# Patient Record
Sex: Male | Born: 1985 | Hispanic: No | Marital: Married | Smoking: Former smoker
Health system: Southern US, Community
[De-identification: ages and names within clinical notes are randomized; demographics above are authoritative.]

## PROBLEM LIST (undated history)

## (undated) DIAGNOSIS — N028 Recurrent and persistent hematuria with other morphologic changes: Secondary | ICD-10-CM

## (undated) DIAGNOSIS — N289 Disorder of kidney and ureter, unspecified: Secondary | ICD-10-CM

## (undated) DIAGNOSIS — N390 Urinary tract infection, site not specified: Secondary | ICD-10-CM

## (undated) HISTORY — PX: CYST REMOVAL NECK: SHX6281

---

## 2013-02-01 ENCOUNTER — Inpatient Hospital Stay (HOSPITAL_COMMUNITY)
Admission: EM | Admit: 2013-02-01 | Discharge: 2013-02-08 | DRG: 700 | Disposition: A | Discharge status: Home / Self Care | Payer: Medicaid Other | Attending: Internal Medicine | Admitting: Internal Medicine

## 2013-02-01 ENCOUNTER — Encounter (HOSPITAL_COMMUNITY): Payer: Self-pay

## 2013-02-01 DIAGNOSIS — N059 Unspecified nephritic syndrome with unspecified morphologic changes: Principal | ICD-10-CM | POA: Diagnosis present

## 2013-02-01 DIAGNOSIS — N390 Urinary tract infection, site not specified: Secondary | ICD-10-CM

## 2013-02-01 DIAGNOSIS — D72829 Elevated white blood cell count, unspecified: Secondary | ICD-10-CM | POA: Diagnosis present

## 2013-02-01 DIAGNOSIS — R109 Unspecified abdominal pain: Secondary | ICD-10-CM

## 2013-02-01 DIAGNOSIS — N3289 Other specified disorders of bladder: Secondary | ICD-10-CM | POA: Diagnosis present

## 2013-02-01 DIAGNOSIS — F17200 Nicotine dependence, unspecified, uncomplicated: Secondary | ICD-10-CM | POA: Diagnosis present

## 2013-02-01 DIAGNOSIS — R319 Hematuria, unspecified: Secondary | ICD-10-CM | POA: Diagnosis present

## 2013-02-01 DIAGNOSIS — N3090 Cystitis, unspecified without hematuria: Secondary | ICD-10-CM | POA: Diagnosis present

## 2013-02-01 DIAGNOSIS — R809 Proteinuria, unspecified: Secondary | ICD-10-CM | POA: Diagnosis present

## 2013-02-01 DIAGNOSIS — N10 Acute tubulo-interstitial nephritis: Secondary | ICD-10-CM

## 2013-02-01 HISTORY — DX: Urinary tract infection, site not specified: N39.0

## 2013-02-01 LAB — CBC WITH DIFFERENTIAL/PLATELET
Basophils Absolute: 0 10*3/uL (ref 0.0–0.1)
Eosinophils Relative: 1 % (ref 0–5)
Lymphocytes Relative: 20 % (ref 12–46)
MCV: 77.6 fL — ABNORMAL LOW (ref 78.0–100.0)
Neutro Abs: 10.2 10*3/uL — ABNORMAL HIGH (ref 1.7–7.7)
Neutrophils Relative %: 73 % (ref 43–77)
Platelets: 334 10*3/uL (ref 150–400)
RBC: 4.47 MIL/uL (ref 4.22–5.81)
RDW: 12.9 % (ref 11.5–15.5)
WBC: 13.9 10*3/uL — ABNORMAL HIGH (ref 4.0–10.5)

## 2013-02-01 LAB — COMPREHENSIVE METABOLIC PANEL
Alkaline Phosphatase: 75 U/L (ref 39–117)
BUN: 14 mg/dL (ref 6–23)
CO2: 28 mEq/L (ref 19–32)
Chloride: 101 mEq/L (ref 96–112)
Creatinine, Ser: 1.14 mg/dL (ref 0.50–1.35)
GFR calc Af Amer: >90 mL/min (ref >90)
GFR calc non Af Amer: 89 mL/min — ABNORMAL LOW (ref >90)
Glucose, Bld: 100 mg/dL — ABNORMAL HIGH (ref 70–99)
Potassium: 3.3 mEq/L — ABNORMAL LOW (ref 3.5–5.1)
Total Bilirubin: 0.2 mg/dL — ABNORMAL LOW (ref 0.3–1.2)

## 2013-02-01 NOTE — ED Notes (Signed)
Patient presents with LLQ and RLQ abdominal pain. Onset 1 day ago. Also endorses anorexia, decreased PO intake, nausea and 3 episodes of vomiting. LBM today after taking dulcolax PO. Prior to today, LBM 3 days ago. Denies fevers, sweats or chills. No hx abdominal surgeries.

## 2013-02-02 ENCOUNTER — Emergency Department (HOSPITAL_COMMUNITY): Payer: Medicaid Other

## 2013-02-02 ENCOUNTER — Inpatient Hospital Stay (HOSPITAL_COMMUNITY): Payer: Medicaid Other

## 2013-02-02 ENCOUNTER — Encounter (HOSPITAL_COMMUNITY): Payer: Self-pay | Admitting: Internal Medicine

## 2013-02-02 DIAGNOSIS — N10 Acute tubulo-interstitial nephritis: Secondary | ICD-10-CM

## 2013-02-02 DIAGNOSIS — R109 Unspecified abdominal pain: Secondary | ICD-10-CM

## 2013-02-02 DIAGNOSIS — N390 Urinary tract infection, site not specified: Secondary | ICD-10-CM

## 2013-02-02 DIAGNOSIS — R319 Hematuria, unspecified: Secondary | ICD-10-CM

## 2013-02-02 LAB — COMPREHENSIVE METABOLIC PANEL
ALT: 13 U/L (ref 0–53)
AST: 14 U/L (ref 0–37)
CO2: 28 mEq/L (ref 19–32)
Calcium: 8.7 mg/dL (ref 8.4–10.5)
Chloride: 104 mEq/L (ref 96–112)
Creatinine, Ser: 1.12 mg/dL (ref 0.50–1.35)
GFR calc Af Amer: >90 mL/min (ref >90)
GFR calc non Af Amer: >90 mL/min (ref >90)
Glucose, Bld: 92 mg/dL (ref 70–99)
Sodium: 143 mEq/L (ref 135–145)
Total Bilirubin: 0.2 mg/dL — ABNORMAL LOW (ref 0.3–1.2)

## 2013-02-02 LAB — URINALYSIS, ROUTINE W REFLEX MICROSCOPIC
Ketones, ur: NEGATIVE mg/dL
Nitrite: NEGATIVE
Protein, ur: 30 mg/dL — AB
pH: 7 (ref 5.0–8.0)

## 2013-02-02 LAB — URINALYSIS, MICROSCOPIC ONLY
Ketones, ur: NEGATIVE mg/dL
Nitrite: NEGATIVE
pH: 5.5 (ref 5.0–8.0)

## 2013-02-02 LAB — CBC WITH DIFFERENTIAL/PLATELET
Basophils Absolute: 0 10*3/uL (ref 0.0–0.1)
Basophils Relative: 0 % (ref 0–1)
Eosinophils Absolute: 0.1 10*3/uL (ref 0.0–0.7)
Hemoglobin: 11.2 g/dL — ABNORMAL LOW (ref 13.0–17.0)
MCH: 27.3 pg (ref 26.0–34.0)
MCHC: 34.4 g/dL (ref 30.0–36.0)
Neutro Abs: 6 10*3/uL (ref 1.7–7.7)
Neutrophils Relative %: 66 % (ref 43–77)
Platelets: 292 10*3/uL (ref 150–400)

## 2013-02-02 LAB — URINE CULTURE

## 2013-02-02 LAB — URINE MICROSCOPIC-ADD ON

## 2013-02-02 MED ORDER — DEXTROSE 5 % IV SOLN: 1.0000 g | Freq: Once | INTRAVENOUS | Status: DC

## 2013-02-02 MED ORDER — ONDANSETRON HCL 4 MG/2ML IJ SOLN
4.0000 mg | Freq: Four times a day (QID) | INTRAMUSCULAR | Status: DC | PRN
  Administered 2013-02-03: 4 mg via INTRAVENOUS
  Filled 2013-02-02: qty 2

## 2013-02-02 MED ORDER — SODIUM CHLORIDE 0.9 % IV SOLN
INTRAVENOUS | Status: DC
  Administered 2013-02-02: 01:00:00 via INTRAVENOUS

## 2013-02-02 MED ORDER — ACETAMINOPHEN 650 MG RE SUPP: 650.0000 mg | Freq: Four times a day (QID) | RECTAL | Status: DC | PRN

## 2013-02-02 MED ORDER — OXYCODONE HCL 5 MG PO TABS
5.0000 mg | ORAL_TABLET | ORAL | Status: DC | PRN
  Administered 2013-02-02 – 2013-02-07 (×6): 5 mg via ORAL
  Filled 2013-02-02 (×6): qty 1

## 2013-02-02 MED ORDER — DEXTROSE 5 % IV SOLN
1.0000 g | Freq: Once | INTRAVENOUS | Status: AC
  Administered 2013-02-02: 1 g via INTRAVENOUS
  Filled 2013-02-02: qty 10

## 2013-02-02 MED ORDER — ONDANSETRON HCL 4 MG/2ML IJ SOLN
4.0000 mg | Freq: Three times a day (TID) | INTRAMUSCULAR | Status: DC | PRN
  Administered 2013-02-02: 4 mg via INTRAVENOUS
  Filled 2013-02-02: qty 2

## 2013-02-02 MED ORDER — DEXTROSE 5 % IV SOLN: 1.0000 g | INTRAVENOUS | Status: DC

## 2013-02-02 MED ORDER — HYDROMORPHONE HCL PF 1 MG/ML IJ SOLN
1.0000 mg | Freq: Once | INTRAMUSCULAR | Status: AC
  Administered 2013-02-02: 1 mg via INTRAVENOUS
  Filled 2013-02-02: qty 1

## 2013-02-02 MED ORDER — ACETAMINOPHEN 325 MG PO TABS: 650.0000 mg | ORAL_TABLET | Freq: Four times a day (QID) | ORAL | Status: DC | PRN

## 2013-02-02 MED ORDER — IOHEXOL 300 MG/ML  SOLN
100.0000 mL | Freq: Once | INTRAMUSCULAR | Status: AC | PRN
  Administered 2013-02-02: 100 mL via INTRAVENOUS

## 2013-02-02 MED ORDER — DEXTROSE-NACL 5-0.45 % IV SOLN
INTRAVENOUS | Status: DC
  Administered 2013-02-02: 08:00:00 via INTRAVENOUS

## 2013-02-02 MED ORDER — ONDANSETRON HCL 4 MG PO TABS: 4.0000 mg | ORAL_TABLET | Freq: Four times a day (QID) | ORAL | Status: DC | PRN

## 2013-02-02 MED ORDER — ONDANSETRON HCL 4 MG/2ML IJ SOLN
4.0000 mg | Freq: Once | INTRAMUSCULAR | Status: AC
  Administered 2013-02-02: 4 mg via INTRAVENOUS
  Filled 2013-02-02: qty 2

## 2013-02-02 MED ORDER — HYDROMORPHONE HCL PF 1 MG/ML IJ SOLN
1.0000 mg | INTRAMUSCULAR | Status: DC | PRN
  Administered 2013-02-02 (×2): 2 mg via INTRAVENOUS
  Administered 2013-02-02: 1 mg via INTRAVENOUS
  Administered 2013-02-03 – 2013-02-05 (×12): 2 mg via INTRAVENOUS
  Administered 2013-02-05: 1 mg via INTRAVENOUS
  Administered 2013-02-05: 2 mg via INTRAVENOUS
  Administered 2013-02-05 (×2): 1 mg via INTRAVENOUS
  Administered 2013-02-05 – 2013-02-06 (×3): 2 mg via INTRAVENOUS
  Administered 2013-02-06: 1 mg via INTRAVENOUS
  Administered 2013-02-06: 2 mg via INTRAVENOUS
  Filled 2013-02-02: qty 1
  Filled 2013-02-02 (×5): qty 2
  Filled 2013-02-02 (×4): qty 1
  Filled 2013-02-02: qty 2
  Filled 2013-02-02: qty 1
  Filled 2013-02-02: qty 2
  Filled 2013-02-02: qty 1
  Filled 2013-02-02 (×5): qty 2
  Filled 2013-02-02: qty 1
  Filled 2013-02-02 (×2): qty 2
  Filled 2013-02-02: qty 1
  Filled 2013-02-02 (×4): qty 2

## 2013-02-02 MED ORDER — IOHEXOL 300 MG/ML  SOLN
50.0000 mL | Freq: Once | INTRAMUSCULAR | Status: AC | PRN
  Administered 2013-02-02: 50 mL via ORAL

## 2013-02-02 MED ORDER — SODIUM CHLORIDE 0.9 % IV BOLUS (SEPSIS)
1000.0000 mL | Freq: Once | INTRAVENOUS | Status: AC
  Administered 2013-02-02: 1000 mL via INTRAVENOUS

## 2013-02-02 MED ORDER — HYDROMORPHONE HCL PF 1 MG/ML IJ SOLN: 1.0000 mg | INTRAMUSCULAR | Status: DC | PRN

## 2013-02-02 MED ORDER — HYDROMORPHONE HCL PF 1 MG/ML IJ SOLN
1.0000 mg | INTRAMUSCULAR | Status: DC | PRN
  Administered 2013-02-02: 1 mg via INTRAVENOUS
  Filled 2013-02-02: qty 1

## 2013-02-02 MED ORDER — DEXTROSE 5 % IV SOLN
1.0000 g | Freq: Two times a day (BID) | INTRAVENOUS | Status: DC
  Administered 2013-02-02 – 2013-02-05 (×7): 1 g via INTRAVENOUS
  Filled 2013-02-02 (×8): qty 10

## 2013-02-02 MED ORDER — POTASSIUM CHLORIDE CRYS ER 20 MEQ PO TBCR
40.0000 meq | EXTENDED_RELEASE_TABLET | Freq: Once | ORAL | Status: AC
  Administered 2013-02-02: 40 meq via ORAL
  Filled 2013-02-02: qty 2

## 2013-02-02 MED ORDER — SODIUM CHLORIDE 0.9 % IV SOLN
INTRAVENOUS | Status: AC
  Administered 2013-02-02 – 2013-02-03 (×3): via INTRAVENOUS

## 2013-02-02 NOTE — ED Provider Notes (Signed)
History     CSN: [id number]  Arrival date & time 02/01/13  2046   First MD Initiated Contact with Patient 02/01/13 2354      Chief Complaint  Patient presents with  . Abdominal Pain    (Consider location/radiation/quality/duration/timing/severity/associated sxs/prior treatment) Patient is a 27 y.o. male presenting with abdominal pain. The history is provided by the patient.  Abdominal Pain Associated symptoms: hematuria, nausea and vomiting   Associated symptoms: no chest pain, no dysuria, no fever and no shortness of breath    2 day history of bilateral flank pain the flank pain is severe stenotic 10 associated with very dark urine associated with nausea and vomiting x3 no diarrhea abdominal pain is described as sharp it does radiate to the back and to the suprapubic area. No prior history of similar symptoms. Negative past medical history.  History reviewed. No pertinent past medical history.  History reviewed. No pertinent past surgical history.  No family history on file.  History  Substance Use Topics  . Smoking status: Current Every Day Smoker -- 1.00 packs/day  . Smokeless tobacco: Never Used  . Alcohol Use: No      Review of Systems  Constitutional: Negative for fever and diaphoresis.  HENT: Negative for congestion.   Eyes: Negative for redness.  Respiratory: Negative for shortness of breath.   Cardiovascular: Negative for chest pain.  Gastrointestinal: Positive for nausea, vomiting and abdominal pain.  Genitourinary: Positive for hematuria and flank pain. Negative for dysuria.  Musculoskeletal: Positive for back pain.  Skin: Negative for rash.  Neurological: Negative for headaches.  Hematological: Does not bruise/bleed easily.  Psychiatric/Behavioral: Negative for confusion.    Allergies  Review of patient's allergies indicates no known allergies.  Home Medications  No current outpatient prescriptions on file.  BP 118/78  Pulse 67  Temp(Src) 98 F  (36.7 C) (Oral)  Resp 18  SpO2 96%  Physical Exam  Constitutional: He is oriented to person, place, and time. He appears well-developed and well-nourished. He appears distressed.  HENT:  Head: Normocephalic and atraumatic.  Next membranes are dry.  Eyes: Conjunctivae and EOM are normal. Pupils are equal, round, and reactive to light.  Neck: Normal range of motion.  Cardiovascular: Normal rate, regular rhythm and normal heart sounds.   No murmur heard. Pulmonary/Chest: Effort normal and breath sounds normal.  Abdominal: Soft. Bowel sounds are normal. There is no tenderness.  Musculoskeletal: Normal range of motion. He exhibits no edema.  Neurological: He is alert and oriented to person, place, and time. No cranial nerve deficit. He exhibits normal muscle tone. Coordination normal.  Skin: Skin is warm. No rash noted.    ED Course  Procedures (including critical care time)  Labs Reviewed  COMPREHENSIVE METABOLIC PANEL - Abnormal; Notable for the following:    Potassium 3.3 (*)    Glucose, Bld 100 (*)    Albumin 3.2 (*)    Total Bilirubin 0.2 (*)    GFR calc non Af Amer 89 (*)    All other components within normal limits  CBC WITH DIFFERENTIAL - Abnormal; Notable for the following:    WBC 13.9 (*)    Hemoglobin 12.4 (*)    HCT 34.7 (*)    MCV 77.6 (*)    Neutro Abs 10.2 (*)    All other components within normal limits  URINALYSIS, MICROSCOPIC ONLY - Abnormal; Notable for the following:    APPearance CLOUDY (*)    Hgb urine dipstick LARGE (*)  Protein, ur >300 (*)    Leukocytes, UA SMALL (*)    Bacteria, UA MANY (*)    Squamous Epithelial / LPF FEW (*)    Casts HYALINE CASTS (*)    All other components within normal limits  URINE CULTURE  LIPASE, BLOOD   Ct Abdomen Pelvis W Contrast  02/02/2013  *RADIOLOGY REPORT*  Clinical Data: Left lower quadrant and right lower quadrant pain for 1 day.  Anorexia, nausea, and vomiting.  CT ABDOMEN AND PELVIS WITH CONTRAST   Technique:  Multidetector CT imaging of the abdomen and pelvis was performed following the standard protocol during bolus administration of intravenous contrast.  Contrast: OMNIPAQUE IOHEXOL 300 MG/ML  SOLN  Comparison: None.  Findings: Dependent atelectasis in the lung bases.  The liver, spleen, gallbladder, pancreas, adrenal glands, kidneys, abdominal aorta, inferior vena cava, and retroperitoneal lymph nodes are unremarkable.  The stomach and small bowel are not abnormally distended.  Stool filled colon without distension or wall thickening.  No free air or free fluid in the abdomen. Abdominal wall musculature appears intact.  Pelvis:  Mild prominence of prostate gland, measuring about three by 4.2 cm.  Bladder wall is diffusely thickened suggesting the bladder hypertrophy versus cystitis.  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  Diverticula in the sigmoid colon without diverticulitis.  No free or loculated pelvic fluid collections. The appendix is normal.  No significant pelvic lymphadenopathy. Normal alignment of the lumbar vertebrae.  IMPRESSION: No acute process demonstrated in the abdomen or pelvis. Nonspecific bladder wall thickening which could be due to hypertrophy or cystitis.   Original Report Authenticated By: Burman Nieves, M.D.      1. Hematuria   2. Flank pain       MDM   Suspect patient's hematuria is related to a bladder infection or perhaps kidney infection however renal function is normal. No evidence of renal stones on the CT scan although was done with contrast. Patient's had a lot of difficulties with pain control and will require admission. Discuss with the hospitalist they will admit. He is unassigned does not have a primary care Dr. locally. Patient's had pain complaint now for 2 days. Does have a leukocytosis but no fever. Patient has had 3 episodes of vomiting. Patient urine was sent for culture and he  was given Rocephin 1 g IV piggyback. Temporary admit orders done.        Shelda Jakes, MD 02/02/13 [phone]

## 2013-02-02 NOTE — ED Notes (Signed)
Patient laying on stretcher. Patient complaining of pain in left and right flank areas. Denies any nausea at this time. Patient guarding and moaning in pain, states pain is 8/10. Family member at bedside with patient. No acute distress noted, resp are even and unlabored. Will continue to monitor.

## 2013-02-02 NOTE — ED Notes (Signed)
Patient resting on stretcher at this time, sleeping comfortably. Informed patients family member of plan of care. Patient in no acute distress, resp are even and unlabored. Call light on bed. Will continue to monitor.

## 2013-02-02 NOTE — ED Notes (Signed)
Patient using urinal at bedside at this time.

## 2013-02-02 NOTE — H&P (Addendum)
[facility] History and Physical  Mikle Sternberg ZOX:[id number] DOB: 01-20-1986 DOA: 02/01/2013  Referring physician: Dr.Zackowski. PCP: No PCP Per Patient  Specialists: None.  Chief Complaint: Bilateral flank pain.  HPI: Gary Cox is a 27 y.o. male with no significant past medical history who is only from [place] and has been living in the [place] for last one year, has been experiencing bilateral flank pain over the last 2 days. The pain has been gradually worsening with nausea denies any vomiting fever chills dysuria or diarrhea. Patient has been having hematuria along with the flank pain. In the ER UA reveals hematuria and blood work shows mild leukocytosis. CT abdomen and pelvis with contrast only shows possibility of cystitis. Patient at this time has been started on ceftriaxone empirically and since patient has received multiple doses of pain medication has been admitted for further management. Patient states he had a similar episode last year. Denies any chest pain or shortness of breath. Denies any penile discharge or scrotal pain.   Review of Systems: As presented in the history of presenting illness, rest negative.  History reviewed. No pertinent past medical history. Past Surgical History  Procedure Laterality Date  . Cyst removal neck     Social History:  reports that he has been smoking.  He has never used smokeless tobacco. He reports that he does not drink alcohol or use illicit drugs. Lives at home  where does patient live--  can do ADLs.  Can patient participate in ADLs?  No Known Allergies  Family History  Problem Relation Age of Onset  . Hypertension Mother   . Hypertension Father       Prior to Admission medications   Not on File   Physical Exam: Filed Vitals:   02/02/13 0330 02/02/13 0430 02/02/13 0515 02/02/13 0545  BP: 125/76 118/78 117/73 114/60  Pulse: 62 67 65 69  Temp:      TempSrc:      Resp:      SpO2: 100% 96% 97% 98%     General:    well-developed well-nourished.   Eyes: Anicteric no pallor.   ENT: No discharge from the ears eyes nose or mouth.   Neck: No mass felt.   Cardiovascular:  S1-S2 heard.   Respiratory:  no rhonchi or crepitations.   Abdomen:  soft nontender bowel sounds present. I did not see any scrotal swelling or penile discharge.   Skin: No rash.   Musculoskeletal:  no edema.   Psychiatric:  appears normal.   Neurologic:  alert awake oriented to time place and person. Moves all extremities.   Labs on Admission:  Basic Metabolic Panel:  Recent Labs Lab 02/01/13 2112  NA 140  K 3.3*  CL 101  CO2 28  GLUCOSE 100*  BUN 14  CREATININE 1.14  CALCIUM 9.7   Liver Function Tests:  Recent Labs Lab 02/01/13 2112  AST 15  ALT 17  ALKPHOS 75  BILITOT 0.2*  PROT 8.1  ALBUMIN 3.2*    Recent Labs Lab 02/01/13 2112  LIPASE 39   No results found for this basename: AMMONIA,  in the last 168 hours CBC:  Recent Labs Lab 02/01/13 2112  WBC 13.9*  NEUTROABS 10.2*  HGB 12.4*  HCT 34.7*  MCV 77.6*  PLT 334   Cardiac Enzymes: No results found for this basename: CKTOTAL, CKMB, CKMBINDEX, TROPONINI,  in the last 168 hours  BNP (last 3 results) No results found for this basename: PROBNP,  in the last  8760 hours CBG: No results found for this basename: GLUCAP,  in the last 168 hours  Radiological Exams on Admission: Ct Abdomen Pelvis W Contrast  02/02/2013  *RADIOLOGY REPORT*  Clinical Data: Left lower quadrant and right lower quadrant pain for 1 day.  Anorexia, nausea, and vomiting.  CT ABDOMEN AND PELVIS WITH CONTRAST  Technique:  Multidetector CT imaging of the abdomen and pelvis was performed following the standard protocol during bolus administration of intravenous contrast.  Contrast: OMNIPAQUE IOHEXOL 300 MG/ML  SOLN  Comparison: None.  Findings: Dependent atelectasis in the lung bases.  The liver, spleen, gallbladder, pancreas, adrenal glands, kidneys, abdominal aorta,  inferior vena cava, and retroperitoneal lymph nodes are unremarkable.  The stomach and small bowel are not abnormally distended.  Stool filled colon without distension or wall thickening.  No free air or free fluid in the abdomen. Abdominal wall musculature appears intact.  Pelvis:  Mild prominence of prostate gland, measuring about three by 4.2 cm.  Bladder wall is diffusely thickened suggesting the bladder hypertrophy versus cystitis.  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  Diverticula in the sigmoid colon without diverticulitis.  No free or loculated pelvic fluid collections. The appendix is normal.  No significant pelvic lymphadenopathy. Normal alignment of the lumbar vertebrae.  IMPRESSION: No acute process demonstrated in the abdomen or pelvis. Nonspecific bladder wall thickening which could be due to hypertrophy or cystitis.   Original Report Authenticated By: Burman Nieves, M.D.      Assessment/Plan Principal Problem:   Flank pain Active Problems:   Hematuria   1.  Bilateral flank pain with hematuria - continue with ceftriaxone. Follow urine cultures. Check GC and Chlamydia probe. Continued IV hydration and pain medication. Recheck labs particularly hemoglobin. Since patient's urine also shows proteinuria but only hyaline casts may discuss with nephrology in a.m.    Code Status: full code.   Family Communication:  patient's family at bedside.   Disposition Plan:  admit to inpatient.   Cornisha Zetino N. [facility] Pager [phone].  If 7PM-7AM, please contact night-coverage [email or web address] Password [facility] 02/02/2013, 6:42 AM

## 2013-02-02 NOTE — ED Notes (Signed)
Patient resting on stretcher at this time. Patient denies any pain. No acute distress noted, resp are even and unlabored. Call light at bedside. Family member at bedside. Will continue to monitor.

## 2013-02-02 NOTE — ED Notes (Signed)
Patient resting on stretcher at this time. Patient has no complaints. Patient requesting something to eat and drink, verified with MD. Patient given drink and crackers. No acute distress noted, resp are even and unlabored. Family member at bedside. Call light on bed. Will continue to monitor.

## 2013-02-02 NOTE — ED Notes (Signed)
Patient laying on stretcher at this time, no acute distress noted. resp are even and unlabored. Will continue to monitor.

## 2013-02-02 NOTE — ED Notes (Signed)
Pt back in stretcher, wife at the bedside. Given warm blanket.

## 2013-02-02 NOTE — ED Notes (Signed)
Patient now experiencing pain at this time in flank area on left and right. States pain is 10/10. Will notify MD

## 2013-02-02 NOTE — Progress Notes (Signed)
Patient ID: Gary Cox  male  ZOX:[id number]    DOB: 19-Jan-1986    DOA: 02/01/2013  PCP: No PCP Per Patient   Patient admitted by Dr. Toniann Fail today AM, H&P reviewed, and agree with assessment and plan  Assessment/Plan: Principal Problem:   Flank pain likely secondary to bilateral pyelonephritis/UTI - Ordered blood cultures, urine cultures, continue IV Rocephin - He needs to have urology follow-up after dc or in-pt if not improving  Active Problems:   Hematuria/proteinuria with bladder hypertrophy versus cystitis - ordered renal ultrasound, SPEP, UPEP - No fevers currently, no other joint pains or conjunctivitis to suggest Reiter's syndrome, check ESR, CRP.   DVT Prophylaxis: SCD's   Code Status:  Disposition:    Subjective: Complaining of bilateral flank pain  Objective: Weight change:   Intake/Output Summary (Last 24 hours) at 02/02/13 1449 Last data filed at 02/02/13 1027  Gross per 24 hour  Intake      0 ml  Output    300 ml  Net   -300 ml   Blood pressure 115/65, pulse 77, temperature 97.9 F (36.6 C), temperature source Oral, resp. rate 15, SpO2 99.00%.  Physical Exam: General: Alert and awake, oriented x3, uncomfortable with pain HEENT: anicteric sclera, PERLA, EOMI CVS: S1-S2 clear, no murmur rubs or gallops Chest: CTAB Abdomen: soft nontender, nondistended, normal bowel sounds, b/l CVAT Extremities: no c/c/e bilaterally, no joint effusions   Lab Results: Basic Metabolic Panel:  Recent Labs Lab 02/01/13 2112 02/02/13 0904  NA 140 143  K 3.3* 3.6  CL 101 104  CO2 28 28  GLUCOSE 100* 92  BUN 14 12  CREATININE 1.14 1.12  CALCIUM 9.7 8.7   Liver Function Tests:  Recent Labs Lab 02/01/13 2112 02/02/13 0904  AST 15 14  ALT 17 13  ALKPHOS 75 70  BILITOT 0.2* 0.2*  PROT 8.1 7.2  ALBUMIN 3.2* 2.6*    Recent Labs Lab 02/01/13 2112  LIPASE 39   No results found for this basename: AMMONIA,  in the last 168 hours CBC:  Recent  Labs Lab 02/01/13 2112 02/02/13 0904  WBC 13.9* 9.1  NEUTROABS 10.2* 6.0  HGB 12.4* 11.2*  HCT 34.7* 32.6*  MCV 77.6* 79.3  PLT 334 292   Cardiac Enzymes: No results found for this basename: CKTOTAL, CKMB, CKMBINDEX, TROPONINI,  in the last 168 hours BNP: No components found with this basename: POCBNP,  CBG: No results found for this basename: GLUCAP,  in the last 168 hours   Micro Results: No results found for this or any previous visit (from the past 240 hour(s)).  Studies/Results: Ct Abdomen Pelvis W Contrast  02/02/2013  *RADIOLOGY REPORT*  Clinical Data: Left lower quadrant and right lower quadrant pain for 1 day.  Anorexia, nausea, and vomiting.  CT ABDOMEN AND PELVIS WITH CONTRAST  Technique:  Multidetector CT imaging of the abdomen and pelvis was performed following the standard protocol during bolus administration of intravenous contrast.  Contrast: OMNIPAQUE IOHEXOL 300 MG/ML  SOLN  Comparison: None.  Findings: Dependent atelectasis in the lung bases.  The liver, spleen, gallbladder, pancreas, adrenal glands, kidneys, abdominal aorta, inferior vena cava, and retroperitoneal lymph nodes are unremarkable.  The stomach and small bowel are not abnormally distended.  Stool filled colon without distension or wall thickening.  No free air or free fluid in the abdomen. Abdominal wall musculature appears intact.  Pelvis:  Mild prominence of prostate gland, measuring about three by 4.2 cm.  Bladder wall is diffusely  thickened suggesting the bladder hypertrophy versus cystitis.  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  Diverticula in the sigmoid colon without diverticulitis.  No free or loculated pelvic fluid collections. The appendix is normal.  No significant pelvic lymphadenopathy. Normal alignment of the lumbar vertebrae.  IMPRESSION: No acute process demonstrated in the abdomen or pelvis. Nonspecific bladder wall  thickening which could be due to hypertrophy or cystitis.   Original Report Authenticated By: Burman Nieves, M.D.     Medications: Scheduled Meds: . cefTRIAXone (ROCEPHIN)  IV  1 g Intravenous Q12H      LOS: 1 day   Jimya Ciani M.D. [facility] 02/02/2013, 2:49 PM Pager: [phone]  If 7PM-7AM, please contact night-coverage [email or web address] Password [email or web address]

## 2013-02-03 ENCOUNTER — Encounter (HOSPITAL_COMMUNITY): Payer: Self-pay | Admitting: General Practice

## 2013-02-03 LAB — UIFE/LIGHT CHAINS/TP QN, 24-HR UR
Alpha 2, Urine: DETECTED — AB
Free Kappa Lt Chains,Ur: 3.93 mg/dL — ABNORMAL HIGH (ref 0.14–2.42)
Free Kappa/Lambda Ratio: 3.97 ratio (ref 2.04–10.37)
Free Lambda Lt Chains,Ur: 0.99 mg/dL — ABNORMAL HIGH (ref 0.02–0.67)
Total Protein, Urine: 22.3 mg/dL

## 2013-02-03 LAB — URINE CULTURE
Colony Count: NO GROWTH
Culture: NO GROWTH

## 2013-02-03 LAB — BASIC METABOLIC PANEL
BUN: 8 mg/dL (ref 6–23)
Chloride: 107 mEq/L (ref 96–112)
Creatinine, Ser: 1.03 mg/dL (ref 0.50–1.35)
Glucose, Bld: 93 mg/dL (ref 70–99)
Potassium: 3.7 mEq/L (ref 3.5–5.1)

## 2013-02-03 LAB — CBC
HCT: 31.9 % — ABNORMAL LOW (ref 39.0–52.0)
Hemoglobin: 10.8 g/dL — ABNORMAL LOW (ref 13.0–17.0)
MCH: 26.9 pg (ref 26.0–34.0)
MCHC: 33.9 g/dL (ref 30.0–36.0)

## 2013-02-03 MED ORDER — WHITE PETROLATUM GEL
Status: AC
  Administered 2013-02-03: 0.2
  Filled 2013-02-03: qty 5

## 2013-02-03 MED ORDER — SODIUM CHLORIDE 0.9 % IV SOLN
INTRAVENOUS | Status: DC
  Administered 2013-02-03 – 2013-02-05 (×5): via INTRAVENOUS
  Administered 2013-02-06: 1000 mL via INTRAVENOUS
  Administered 2013-02-06 – 2013-02-07 (×2): via INTRAVENOUS

## 2013-02-03 NOTE — Progress Notes (Signed)
[facility] PROGRESS NOTE  Gary Cox ZOX:[id number] DOB: December 27, 1985 DOA: 02/01/2013 PCP: No PCP Per Patient  Assessment/Plan: 1. Pyelonephritis. Urine culture no growth. Gc/Chlamydia pending.  Continue with Ceftriaxone, day 2 antibiotics. CT with bladder hypertrophy vs cystitis. WBC trending down.  2. Hematuria, proteinuria: ? Secondary to infection. Will review CT scan with radiologist.  3. DVT Prophylaxis: SCD's.    Code Status: Full Code.  Family Communication: Wife at bedside, care discussed with her.  Disposition Plan: home when stable.    Consultants:  none  Procedures:  Renal US: negative renal US.   Antibiotics:  Ceftriaxone 4/1  HPI/Subjective: Still with flank pain, not better.   Objective: Filed Vitals:   02/02/13 2125 02/03/13 0153 02/03/13 0507 02/03/13 1438  BP: 132/77 116/68 125/56 132/73  Pulse: 69 72 66 77  Temp: 97.9 F (36.6 C) 97.6 F (36.4 C) 97.9 F (36.6 C) 98 F (36.7 C)  TempSrc: Oral Oral Oral Oral  Resp: 20 16 18 18   Height:      Weight:      SpO2: 99% 99% 100% 96%    Intake/Output Summary (Last 24 hours) at 02/03/13 1510 Last data filed at 02/03/13 1341  Gross per 24 hour  Intake    540 ml  Output   2725 ml  Net  -2185 ml   Filed Weights   02/02/13 1012  Weight: 69.809 kg (153 lb 14.4 oz)    Exam:   General:  No distress.  Cardiovascular: S 1, S 2 RRR  Respiratory: CTA  Abdomen: BS present, soft, NT  Musculoskeletal: flank pain on palpation.   Data Reviewed: Basic Metabolic Panel:  Recent Labs Lab 02/01/13 2112 02/02/13 0904 02/03/13 0535  NA 140 143 143  K 3.3* 3.6 3.7  CL 101 104 107  CO2 28 28 27   GLUCOSE 100* 92 93  BUN 14 12 8   CREATININE 1.14 1.12 1.03  CALCIUM 9.7 8.7 8.9   Liver Function Tests:  Recent Labs Lab 02/01/13 2112 02/02/13 0904  AST 15 14  ALT 17 13  ALKPHOS 75 70  BILITOT 0.2* 0.2*  PROT 8.1 7.2  ALBUMIN 3.2* 2.6*    Recent Labs Lab 02/01/13 2112  LIPASE  39   CBC:  Recent Labs Lab 02/01/13 2112 02/02/13 0904 02/03/13 0535  WBC 13.9* 9.1 7.6  NEUTROABS 10.2* 6.0  --   HGB 12.4* 11.2* 10.8*  HCT 34.7* 32.6* 31.9*  MCV 77.6* 79.3 79.6  PLT 334 292 277   Cardiac Enzymes: No results found for this basename: CKTOTAL, CKMB, CKMBINDEX, TROPONINI,  in the last 168 hours BNP (last 3 results) No results found for this basename: PROBNP,  in the last 8760 hours CBG: No results found for this basename: GLUCAP,  in the last 168 hours  Recent Results (from the past 240 hour(s))  URINE CULTURE     Status: None   Collection Time    02/02/13 12:33 AM      Result Value Range Status   Specimen Description URINE, CLEAN CATCH   Final   Special Requests ADDED 0058   Final   Culture  Setup Time 02/02/2013 01:03   Final   Colony Count NO GROWTH   Final   Culture NO GROWTH   Final   Report Status 02/03/2013 FINAL   Final  URINE CULTURE     Status: None   Collection Time    02/02/13 10:51 AM      Result Value Range Status  Specimen Description URINE, CLEAN CATCH   Final   Special Requests Normal   Final   Culture  Setup Time 02/02/2013 17:01   Final   Colony Count NO GROWTH   Final   Culture NO GROWTH   Final   Report Status 02/02/2013 FINAL   Final  CULTURE, BLOOD (ROUTINE X 2)     Status: None   Collection Time    02/02/13 11:10 AM      Result Value Range Status   Specimen Description BLOOD LEFT ARM   Final   Special Requests     Final   Value: BOTTLES DRAWN AEROBIC AND ANAEROBIC 10CC BLUE  5CC RED   Culture  Setup Time 02/02/2013 16:15   Final   Culture     Final   Value:        BLOOD CULTURE RECEIVED NO GROWTH TO DATE CULTURE WILL BE HELD FOR 5 DAYS BEFORE ISSUING A FINAL NEGATIVE REPORT   Report Status PENDING   Incomplete  CULTURE, BLOOD (ROUTINE X 2)     Status: None   Collection Time    02/02/13 11:20 AM      Result Value Range Status   Specimen Description BLOOD LEFT HAND   Final   Special Requests BOTTLES DRAWN AEROBIC AND  ANAEROBIC 10CC   Final   Culture  Setup Time 02/02/2013 16:14   Final   Culture     Final   Value:        BLOOD CULTURE RECEIVED NO GROWTH TO DATE CULTURE WILL BE HELD FOR 5 DAYS BEFORE ISSUING A FINAL NEGATIVE REPORT   Report Status PENDING   Incomplete     Studies: Ct Abdomen Pelvis W Contrast  02/02/2013  *RADIOLOGY REPORT*  Clinical Data: Left lower quadrant and right lower quadrant pain for 1 day.  Anorexia, nausea, and vomiting.  CT ABDOMEN AND PELVIS WITH CONTRAST  Technique:  Multidetector CT imaging of the abdomen and pelvis was performed following the standard protocol during bolus administration of intravenous contrast.  Contrast: OMNIPAQUE IOHEXOL 300 MG/ML  SOLN  Comparison: None.  Findings: Dependent atelectasis in the lung bases.  The liver, spleen, gallbladder, pancreas, adrenal glands, kidneys, abdominal aorta, inferior vena cava, and retroperitoneal lymph nodes are unremarkable.  The stomach and small bowel are not abnormally distended.  Stool filled colon without distension or wall thickening.  No free air or free fluid in the abdomen. Abdominal wall musculature appears intact.  Pelvis:  Mild prominence of prostate gland, measuring about three by 4.2 cm.  Bladder wall is diffusely thickened suggesting the bladder hypertrophy versus cystitis.  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  Diverticula in the sigmoid colon without diverticulitis.  No free or loculated pelvic fluid collections. The appendix is normal.  No significant pelvic lymphadenopathy. Normal alignment of the lumbar vertebrae.  IMPRESSION: No acute process demonstrated in the abdomen or pelvis. Nonspecific bladder wall thickening which could be due to hypertrophy or cystitis.   Original Report Authenticated By: Burman Nieves, M.D.    US Renal  02/02/2013  *RADIOLOGY REPORT*  Clinical Data: Hematuria with proteinuria, pyelonephritis  RENAL/URINARY TRACT  ULTRASOUND COMPLETE  Comparison:  CT abdomen pelvis dated 02/02/2013  Findings:  Right Kidney:  Measures 12.8 cm.  No mass or hydronephrosis.  Left Kidney:  Measures 13.0 cm.  No mass or hydronephrosis.  Bladder:  Within normal limits.  IMPRESSION: Negative renal ultrasound.   Original  Report Authenticated By: Charline Bills, M.D.     Scheduled Meds: . cefTRIAXone (ROCEPHIN)  IV  1 g Intravenous Q12H   Continuous Infusions: . sodium chloride      Principal Problem:   Flank pain Active Problems:   Hematuria   Acute pyelonephritis- bilateral   UTI (urinary tract infection)    Time spent: 25 minutes     Keanen Dohse  [facility] Pager [phone]. If 7PM-7AM, please contact night-coverage at [email or web address], password [facility] 02/03/2013, 3:10 PM  LOS: 2 days

## 2013-02-04 LAB — BASIC METABOLIC PANEL
Chloride: 101 mEq/L (ref 96–112)
Creatinine, Ser: 1.04 mg/dL (ref 0.50–1.35)
GFR calc Af Amer: >90 mL/min (ref >90)
Potassium: 3.4 mEq/L — ABNORMAL LOW (ref 3.5–5.1)

## 2013-02-04 LAB — GC/CHLAMYDIA PROBE AMP: CT Probe RNA: NEGATIVE

## 2013-02-04 LAB — CBC
HCT: 33.4 % — ABNORMAL LOW (ref 39.0–52.0)
RDW: 12.8 % (ref 11.5–15.5)
WBC: 6.1 10*3/uL (ref 4.0–10.5)

## 2013-02-04 LAB — MICROALBUMIN / CREATININE URINE RATIO
Creatinine, Urine: 41.9 mg/dL
Microalb Creat Ratio: 304.3 mg/g — ABNORMAL HIGH (ref 0.0–30.0)

## 2013-02-04 NOTE — Progress Notes (Signed)
[facility] PROGRESS NOTE  Gary Cox ZOX:[id number] DOB: [birth year] DOA: 02/01/2013 PCP: No PCP Per Patient  Assessment/Plan: 1. Pyelonephritis ? Marland Kitchen Urine culture no growth. Gc/Chlamydia negative.  Continue with Ceftriaxone, day 3 antibiotics. CT with bladder hypertrophy vs cystitis. WBC trending down. Will consult urology.  2. Hematuria, proteinuria: I review  CT scan with radiologist no kidney stone, maybe some renal changes. Will consult urologist. I discussed case with Dr Caryn Section, who  recommend urine protein and cr  in 24 hour, ANA, Hepatitis C, urologist evaluation.  3. DVT Prophylaxis: SCD's.    Code Status: Full Code.  Family Communication: Wife at bedside, care discussed with her.  Disposition Plan: home when stable.    Consultants:  urologist  Procedures:  Renal US: negative renal US.   Antibiotics:  Ceftriaxone 4/1  HPI/Subjective: Still with flank pain, pain today 8/10. Better than yesterday.   Objective: Filed Vitals:   02/03/13 2140 02/04/13 0547 02/04/13 0945 02/04/13 1305  BP: 135/75 126/74 135/71 143/69  Pulse: 77 67 60 69  Temp: 98.2 F (36.8 C) 98.4 F (36.9 C) 98.1 F (36.7 C) 97.6 F (36.4 C)  TempSrc: Oral Oral Oral Oral  Resp: 18 18 16 12   Height:      Weight:      SpO2: 94% 99% 100% 99%    Intake/Output Summary (Last 24 hours) at 02/04/13 1757 Last data filed at 02/04/13 1530  Gross per 24 hour  Intake 2493.33 ml  Output   2050 ml  Net 443.33 ml   Filed Weights   02/02/13 1012  Weight: 69.809 kg (153 lb 14.4 oz)    Exam:   General:  No distress.  Cardiovascular: S 1, S 2 RRR  Respiratory: CTA  Abdomen: BS present, soft, NT  Musculoskeletal: flank pain on palpation.   Data Reviewed: Basic Metabolic Panel:  Recent Labs Lab 02/01/13 2112 02/02/13 0904 02/03/13 0535 02/04/13 0906  NA 140 143 143 140  K 3.3* 3.6 3.7 3.4*  CL 101 104 107 101  CO2 28 28 27 31   GLUCOSE 100* 92 93 126*  BUN 14 12 8 6    CREATININE 1.14 1.12 1.03 1.04  CALCIUM 9.7 8.7 8.9 9.2   Liver Function Tests:  Recent Labs Lab 02/01/13 2112 02/02/13 0904  AST 15 14  ALT 17 13  ALKPHOS 75 70  BILITOT 0.2* 0.2*  PROT 8.1 7.2  ALBUMIN 3.2* 2.6*    Recent Labs Lab 02/01/13 2112  LIPASE 39   CBC:  Recent Labs Lab 02/01/13 2112 02/02/13 0904 02/03/13 0535 02/04/13 0906  WBC 13.9* 9.1 7.6 6.1  NEUTROABS 10.2* 6.0  --   --   HGB 12.4* 11.2* 10.8* 11.6*  HCT 34.7* 32.6* 31.9* 33.4*  MCV 77.6* 79.3 79.6 79.7  PLT 334 292 277 320   Cardiac Enzymes: No results found for this basename: CKTOTAL, CKMB, CKMBINDEX, TROPONINI,  in the last 168 hours BNP (last 3 results) No results found for this basename: PROBNP,  in the last 8760 hours CBG: No results found for this basename: GLUCAP,  in the last 168 hours     Studies: No results found.  Scheduled Meds: . cefTRIAXone (ROCEPHIN)  IV  1 g Intravenous Q12H   Continuous Infusions: . sodium chloride 100 mL/hr at 02/04/13 1222    Principal Problem:   Flank pain Active Problems:   Hematuria   Acute pyelonephritis- bilateral   UTI (urinary tract infection)    Time spent: 25 minutes  Chai Routh  [facility] Pager [phone]. If 7PM-7AM, please contact night-coverage at [email or web address], password [facility] 02/04/2013, 5:57 PM  LOS: 3 days

## 2013-02-04 NOTE — Consult Note (Signed)
Urology Consult   Physician requesting consult: Regalado  Reason for consult: Microscopic hematuria ? pyelonephritis  History of Present Illness: Gary Cox is a 27 y.o. male with no significant PMH who was admitted 02/01/13 for eval and treatment of back pain and N/V.  The pt and his wife state that he first had back pain about a week ago that was treated with motrin and resolved.  He then developed severe back pain 2 days prior to admission that was not relieved with OTC meds.  Due to the pain he developed nausea and vomiting as well.  The pain did not radiate.  He denies any hx of fever, chills, dysuria, difficulty voiding, hematuria, and abdominal pain.  In the ED a clean catch UA was positive for bacteria and microscopic hematuria.  His WBC was slightly elevated at 13.  CT scan was WNL except the possibility of bladder wall thickening.  G/C was negative.  Pt was admitted and started on Rocephin.  Prior to initiation of ABx a urine culture was sent and this is negative.    Pt had an episode of back pain similar to this approx 1 year ago while living in [place]. Per his wife he was told he had an "intestinal infection" and was treated with 1 week of ABx after which his sx resolved.  He denies a history of voiding or storage urinary symptoms, hematuria, UTIs, STDs, urolithiasis, GU malignancy/trauma/surgery.  His mother, father, and brother have all had kidney stone issues but he has never passed one to his knowledge.  He smokes less than 1/2 pack of cigarettes per day and has for approx 8 years.   He is currently complaining of severe back pain.  He points to the level of the thoracolumbar spine bilaterally.  He states he only has N/V when his pain is severe.  Nothing makes his back pain worse and the only relief is with pain medication.  His pain has not improved since admission.  He denies F/C, HA, CP, SOB, abdominal pain, and diarrhea/constipation. He denies any recent trauma to back or neck.     Past Medical History  Diagnosis Date  . UTI (urinary tract infection)     Past Surgical History  Procedure Laterality Date  . Cyst removal neck      Current Hospital Medications:  Home Meds:  Prior to Admission medications   Not on File    Scheduled Meds: . cefTRIAXone (ROCEPHIN)  IV  1 g Intravenous Q12H   Continuous Infusions: . sodium chloride 100 mL/hr at 02/04/13 1222   PRN Meds:.acetaminophen, acetaminophen, HYDROmorphone (DILAUDID) injection, ondansetron (ZOFRAN) IV, ondansetron, oxyCODONE  Allergies: No Known Allergies  Family History  Problem Relation Age of Onset  . Hypertension Mother   . Hypertension Father   nephrolithiasis--mother, father, brother  Social History:  reports that he has been smoking Cigarettes.  He has a 5 pack-year smoking history. He has never used smokeless tobacco. He reports that he does not drink alcohol or use illicit drugs.  ROS: A complete review of systems was performed.  All systems are negative except for pertinent findings as noted.  Physical Exam:  Vital signs in last 24 hours: Temp:  [97.6 F (36.4 C)-98.4 F (36.9 C)] 97.6 F (36.4 C) (04/03 1305) Pulse Rate:  [60-77] 69 (04/03 1305) Resp:  [12-18] 12 (04/03 1305) BP: (126-143)/(69-75) 143/69 mmHg (04/03 1305) SpO2:  [94 %-100 %] 99 % (04/03 1305) General:  Alert and oriented, No acute distress HEENT: Normocephalic,  atraumatic Neck: No JVD or lymphadenopathy Cardiovascular: Regular rate and rhythm Lungs: Clear bilaterally Abdomen: Soft, nontender, nondistended, no abdominal masses Back: severe pain with gentle palpation of bilateral paraspinal muscles at level of thoracolumbar spine.  Easily reproducible. No pain with palpation over spine or discs. No other areas of discomfort.  Extremities: No edema Neurologic: Grossly intact  Laboratory Data:   Recent Labs  02/01/13 2112 02/02/13 0904 02/03/13 0535 02/04/13 0906  WBC 13.9* 9.1 7.6 6.1  HGB 12.4* 11.2*  10.8* 11.6*  HCT 34.7* 32.6* 31.9* 33.4*  PLT 334 292 277 320     Recent Labs  02/01/13 2112 02/02/13 0904 02/03/13 0535 02/04/13 0906  NA 140 143 143 140  K 3.3* 3.6 3.7 3.4*  CL 101 104 107 101  GLUCOSE 100* 92 93 126*  BUN 14 12 8 6   CALCIUM 9.7 8.7 8.9 9.2  CREATININE 1.14 1.12 1.03 1.04     Results for orders placed during the hospital encounter of 02/01/13 (from the past 24 hour(s))  HIV ANTIBODY (ROUTINE TESTING)     Status: None   Collection Time    02/03/13  5:28 PM      Result Value Range   HIV NON REACTIVE  NON REACTIVE  GC/CHLAMYDIA PROBE AMP     Status: None   Collection Time    02/03/13  6:21 PM      Result Value Range   CT Probe RNA NEGATIVE  NEGATIVE   GC Probe RNA NEGATIVE  NEGATIVE  BASIC METABOLIC PANEL     Status: Abnormal   Collection Time    02/04/13  9:06 AM      Result Value Range   Sodium 140  135 - 145 mEq/L   Potassium 3.4 (*) 3.5 - 5.1 mEq/L   Chloride 101  96 - 112 mEq/L   CO2 31  19 - 32 mEq/L   Glucose, Bld 126 (*) 70 - 99 mg/dL   BUN 6  6 - 23 mg/dL   Creatinine, Ser 1.61  0.50 - 1.35 mg/dL   Calcium 9.2  8.4 - 09.6 mg/dL   GFR calc non Af Amer >90  >90 mL/min   GFR calc Af Amer >90  >90 mL/min  CBC     Status: Abnormal   Collection Time    02/04/13  9:06 AM      Result Value Range   WBC 6.1  4.0 - 10.5 K/uL   RBC 4.19 (*) 4.22 - 5.81 MIL/uL   Hemoglobin 11.6 (*) 13.0 - 17.0 g/dL   HCT 04.5 (*) 40.9 - 81.1 %   MCV 79.7  78.0 - 100.0 fL   MCH 27.7  26.0 - 34.0 pg   MCHC 34.7  30.0 - 36.0 g/dL   RDW 91.4  78.2 - 95.6 %   Platelets 320  150 - 400 K/uL    Results for Rabago, Broly   Ref. Range 02/02/2013 00:33 02/02/2013 10:51  Color, Urine Latest Range: YELLOW  YELLOW YELLOW  APPearance Latest Range: CLEAR  CLOUDY (A) CLOUDY (A)  Specific Gravity, Urine Latest Range: 1.005-1.030  1.017 1.010  pH Latest Range: 5.0-8.0  5.5 7.0  Glucose Latest Range: NEGATIVE mg/dL NEGATIVE NEGATIVE  Bilirubin Urine Latest Range: NEGATIVE   NEGATIVE NEGATIVE  Ketones, ur Latest Range: NEGATIVE mg/dL NEGATIVE NEGATIVE  Protein Latest Range: NEGATIVE mg/dL >213 (A) 30 (A)  Urobilinogen, UA Latest Range: 0.0-1.0 mg/dL 0.2 0.2  Nitrite Latest Range: NEGATIVE  NEGATIVE NEGATIVE  Leukocytes, UA  Latest Range: NEGATIVE  SMALL (A) TRACE (A)  Hgb urine dipstick Latest Range: NEGATIVE  LARGE (A) LARGE (A)  Urine-Other No range found MUCOUS PRESENT   WBC, UA Latest Range: <3 WBC/hpf 3-6 3-6  RBC / HPF Latest Range: <3 RBC/hpf 21-50 TOO NUMEROUS TO COUNT  Squamous Epithelial / LPF Latest Range: RARE  FEW (A)   Bacteria, UA Latest Range: RARE  MANY (A) FEW (A)  Casts Latest Range: NEGATIVE  HYALINE CASTS (A)      Renal Function:  Recent Labs  02/01/13 2112 02/02/13 0904 02/03/13 0535 02/04/13 0906  CREATININE 1.14 1.12 1.03 1.04   Estimated Creatinine Clearance: 102.4 ml/min (by C-G formula based on Cr of 1.04).  Radiologic Imaging: US Renal  02/02/2013  *RADIOLOGY REPORT*  Clinical Data: Hematuria with proteinuria, pyelonephritis  RENAL/URINARY TRACT ULTRASOUND COMPLETE  Comparison:  CT abdomen pelvis dated 02/02/2013  Findings:  Right Kidney:  Measures 12.8 cm.  No mass or hydronephrosis.  Left Kidney:  Measures 13.0 cm.  No mass or hydronephrosis.  Bladder:  Within normal limits.  IMPRESSION: Negative renal ultrasound.   Original Report Authenticated By: Charline Bills, M.D.     RADIOLOGY REPORT Clinical Data: Left lower quadrant and right lower quadrant pain  for 1 day. Anorexia, nausea, and vomiting.  CT ABDOMEN AND PELVIS WITH CONTRAST  Technique: Multidetector CT imaging of the abdomen and pelvis was  performed following the standard protocol during bolus  administration of intravenous contrast.  Contrast: OMNIPAQUE IOHEXOL 300 MG/ML SOLN  Comparison: None.  Findings: Dependent atelectasis in the lung bases.  The liver, spleen, gallbladder, pancreas, adrenal glands, kidneys,  abdominal aorta, inferior vena  cava, and retroperitoneal lymph  nodes are unremarkable. The stomach and small bowel are not  abnormally distended. Stool filled colon without distension or  wall thickening. No free air or free fluid in the abdomen.  Abdominal wall musculature appears intact.  Pelvis: Mild prominence of prostate gland, measuring about three  by 4.2 cm. Bladder wall is diffusely thickened suggesting the  bladder hypertrophy versus cystitis. Linear fibrosis extending  from the dome of the bladder to the umbilicus consistent with  urachal remnant. Diverticula in the sigmoid colon without  diverticulitis. No free or loculated pelvic fluid collections.  The appendix is normal. No significant pelvic lymphadenopathy.  Normal alignment of the lumbar vertebrae.  IMPRESSION:  No acute process demonstrated in the abdomen or pelvis.  Nonspecific bladder wall thickening which could be due to  hypertrophy or cystitis.  Original Report Authenticated By: Burman Nieves, M.D.  Impression/Assessment/Plan:  Microscopic hematuria--his CT scan is normal but he will require an outpatient cystoscopy.  He is a smoker and bladder eval is necessary.  It is possible the pt may have passed a small kidney stone but there is no way to confirm this.  It is also possible the pt has prostatitis but this is less likely.  Dr. Brunilda Payor spoke with the radiologist and there is no actual bladder wall thickening on the CT.  This appearance was due to the bladder not being distended.    The pt does not have pyelonephritis.  His urine culture was negative, his CT and renal U/S were negative, he has been afebrile with a normal WBC,  and he has had no GU sx.  His admission UA that showed bacteria was a clean catch specimen that was likely contaminated.  If his back pain were due to pyelonephritis the pain could not be reproduced on PE and  it would be improving with Abx therapy.  Suggest d/c Abx.    His back pain is musculoskeletal as evidenced on  physical exam.  Would suggest an MRI of the spine to completely eval this.  He denies any recent trauma and this may be muscle spasms.     Silas Flood 02/04/2013, 3:32 PM   I have examined the patient and reviewed Lynden Oxford note.  I agree with the findings and the treatment plan.

## 2013-02-05 ENCOUNTER — Inpatient Hospital Stay (HOSPITAL_COMMUNITY): Payer: Medicaid Other

## 2013-02-05 LAB — PROTEIN, URINE, 24 HOUR
Collection Interval-UPROT: 24 hours
Protein, Urine: 24 mg/dL

## 2013-02-05 LAB — PROTEIN ELECTROPHORESIS, SERUM
Beta 2: 7 % — ABNORMAL HIGH (ref 3.2–6.5)
Beta Globulin: 6.7 % (ref 4.7–7.2)
Gamma Globulin: 21.2 % — ABNORMAL HIGH (ref 11.1–18.8)
M-Spike, %: NOT DETECTED g/dL

## 2013-02-05 LAB — BASIC METABOLIC PANEL
BUN: 9 mg/dL (ref 6–23)
CO2: 32 mEq/L (ref 19–32)
Chloride: 102 mEq/L (ref 96–112)
Creatinine, Ser: 1.16 mg/dL (ref 0.50–1.35)
Glucose, Bld: 100 mg/dL — ABNORMAL HIGH (ref 70–99)

## 2013-02-05 LAB — HEPATITIS PANEL, ACUTE: Hepatitis B Surface Ag: NEGATIVE

## 2013-02-05 LAB — CBC
HCT: 33.5 % — ABNORMAL LOW (ref 39.0–52.0)
MCV: 79.6 fL (ref 78.0–100.0)
RBC: 4.21 MIL/uL — ABNORMAL LOW (ref 4.22–5.81)
RDW: 12.6 % (ref 11.5–15.5)
WBC: 7.2 10*3/uL (ref 4.0–10.5)

## 2013-02-05 MED ORDER — LISINOPRIL 5 MG PO TABS
5.0000 mg | ORAL_TABLET | Freq: Every day | ORAL | Status: DC
  Administered 2013-02-05 – 2013-02-06 (×2): 5 mg via ORAL
  Filled 2013-02-05 (×3): qty 1

## 2013-02-05 MED ORDER — OMEGA-3-ACID ETHYL ESTERS 1 G PO CAPS
2.0000 g | ORAL_CAPSULE | Freq: Two times a day (BID) | ORAL | Status: DC
  Administered 2013-02-05 – 2013-02-08 (×7): 2 g via ORAL
  Filled 2013-02-05 (×9): qty 2

## 2013-02-05 MED ORDER — ZOLPIDEM TARTRATE 5 MG PO TABS
5.0000 mg | ORAL_TABLET | Freq: Once | ORAL | Status: AC
  Administered 2013-02-05: 5 mg via ORAL
  Filled 2013-02-05: qty 1

## 2013-02-05 NOTE — Progress Notes (Addendum)
Gary Cox        RENAL CONSULT   Reason for Consult: Flank pain, hematuria, and 1.16 G. proteinuria Referring Physician: Dr. Hartley Cox  HPI: Gary Cox is a 27 y.o. [place] man admitted with flank pain and hematuria on 02/01/2013. Admitting diagnosis was "pyelonephritis." Urinalysis on admission showed TNTC RBCs, 3-6 WBCs. Urine culture shows no growth. 24 hour urine protein showed 1.1 6G proteinuria/24 HR and 1.99g Cr/24 hr. flank pain persists. Renal consult was requested by Dr. Sunnie Cox. .    Past Medical History  Diagnosis Date  . UTI (urinary tract infection)     Family History  Problem Relation Age of Onset  . Hypertension Mother   . Hypertension Father    he has a 60-month-old child  Social History: Born and grew up in in [place], finished 10th grade. works at [employer] as Conservation officer, nature, Lives with wife ([place]), married to yr,  5 pack years cigarettes, drinks no alcohol  Allergies: No Known Allergies  Medications:  I have reviewed the patient's current medications.   ROS- no angina, no claudication, no melena, no hematochezia, no gross hematuria, no renal colic, no decreased force of urinary stream, no nocturia, no doe, no orthopnea, no purulent sputum, no hemoptysis, no weight gain or weight loss, no cold or heat intolerance.  Physical Exam Blood pressure 131/75, pulse 89, temperature 98.7 F (37.1 C), temperature source Oral, resp. rate 17, height 5\' 7"  (1.702 m), weight 69.809 kg (153 lb 14.4 oz), SpO2 97.00%. General-awake, alert, friendly, has pain in both flanks Nose, mouth, pharynx-moist mucous membranes, no oral ulcers Chest-clear Heart-no rub Abd-no organs or masses felt, bowel sounds present, no bruits are heard, bilateral CVA tenderness GU-circumcised penis, testes descended bilaterally, no masses felt Extr-no arthritis, no rash, no edema, no atheroembolic changes Neuro- right-handed, strength equal, sensation  intact  Lab- UA-SG 1.010, pH 7, glucose, ketones, nitrite are negative, protein 30, too numerous to count RBCs, 3-6 WBCs, occasional hyaline casts CBC-hemoglobin 11.5, WBC 7200, PLT 295K CMET-sodium 140, potassium 3.9, sclerotic 102, CO2 32, BUN 9, CR 1.16, calcium 8.9, alkaline phosphatase 70, albumin 2.6 ANA-neg Hep C-neg HIV-neg 24 hour urine protein-1.1 6G 24-hour urine creatinine-1.99 G, creatinine clearance with CR 1.16 equals 120 ml/min  CT abdomen and pelvis-kidneys appeared normal, no renal calculi, "no acute process demonstrated in abdomen or pelvis."   Assessment/Plan: 1. Flank pain/hematuria 2. Proteinuria 3. 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 The only diagnosis is by renal biopsy. I would not recommend that at this time. Several therapies have been tried for Ig A. Nephropathy. Converting enzyme inhibitors occasionally will relieve flank pain. Treatment with fish oil also can reduce proteinuria and preserve renal function in some cases. I would like to try both of these at this time.  Recommend:  Fish oil (enough to give 2 g EPA per day). This comes out to 2 Lovaza BID.   Lisinopril 5 mg/D. (as long as BP does not get too low). If proteinuria persists or if renal function worsens, then renal biopsy may be indicated. I don't think he should be started on methylprednisolone or prednisone at this time. Let's see if starting these medicines helps his flank pain area I don't think he needs any more antibiotics  Gary Cox F 02/05/2013, 3:40 PM

## 2013-02-05 NOTE — Progress Notes (Signed)
  Subjective: Patient reports: No pain at this time.  Took some pain medicine earlier this morning.  Objective: Vital signs in last 24 hours: Temp:  [97.6 F (36.4 C)-99.1 F (37.3 C)] 98.4 F (36.9 C) (04/04 0620) Pulse Rate:  [60-72] 72 (04/04 0620) Resp:  [12-19] 18 (04/04 0620) BP: (135-143)/(69-97) 137/81 mmHg (04/04 0620) SpO2:  [94 %-100 %] 94 % (04/04 0620)  Intake/Output from previous day: 04/03 0701 - 04/04 0700 In: 3210 [P.O.:880; I.V.:2330] Out: 1750 [Urine:1750] Intake/Output this shift:    Physical Exam:  No CVA tenderness.  Kidneys are not palpable. Lungs - Normal respiratory effort, chest expands symmetrically.  Abdomen - Soft, non-tender & non-distended  Lab Results:  Recent Labs  02/03/13 0535 02/04/13 0906 02/05/13 0550  HGB 10.8* 11.6* 11.5*  HCT 31.9* 33.4* 33.5*   BMET  Recent Labs  02/04/13 0906 02/05/13 0550  NA 140 140  K 3.4* 3.9  CL 101 102  CO2 31 32  GLUCOSE 126* 100*  BUN 6 9  CREATININE 1.04 1.16  CALCIUM 9.2 8.9   No results found for this basename: LABPT, INR,  in the last 72 hours No results found for this basename: LABURIN,  in the last 72 hours   Studies/Results: No results found.  Assessment/Plan:  Back pain.  Do not think it is related to the urinary tracts. Urine culture is negative.  CT scan revealed normal kidneys.  I reviewed the CT scan with Dr Myles Rosenthal.  The appearance of thickened bladder wall is felt to be secondary to under distended bladder.  Suggest MRI lumbo sacral spine to rule out any spinal cord issue.  Will need cystoscopy for microhematuria although I doubt he has a bladder tumor.  Can be done as outpatient if patient is discharged soon.      LOS: 4 days   Gary Cox 02/05/2013, 9:15 AM

## 2013-02-05 NOTE — Progress Notes (Signed)
Patient returned from MRI via bed.  Patient moaning and crying out in pain.  Requesting IV pain medicine.  Dilaudid IV 2mg  ordered q 3 hrs prn.  Patient requesting a dose now.  Pharmacist consulted about giving pain medicine early and stated it was OK.  Dilaudid 2mg  IV given at 1730.  Will continue to monitor.

## 2013-02-05 NOTE — Progress Notes (Signed)
[facility] PROGRESS NOTE  [name] ZOX:[id number] DOB: 05-02-1986 DOA: 02/01/2013 PCP: No PCP Per Patient  Assessment/Plan: 1. Flank pain; urology doesn't think patient has pyelonephritis.  Urine culture no growth. Gc/Chlamydia negative.  CT with bladder hypertrophy vs cystitis. WBC trending down. Urology recommend MRI lumbar spine. Cystoscopy out patient. Will observed off antibiotics.  2. Hematuria, proteinuria: ANA negative, hepatitis B and C negative. 24 hour Protein in urine pending. If more than 1 gr of protein will consult renal.  3. DVT Prophylaxis: SCD's.    Code Status: Full Code.  Family Communication: Wife at bedside, care discussed with her.  Disposition Plan: home when stable.    Consultants:  urologist  Procedures:  Renal US: negative renal US.   Antibiotics:  Ceftriaxone 4/1---4-4  HPI/Subjective: Still with flank pain, pain today 8/10.   Objective: Filed Vitals:   02/04/13 0945 02/04/13 1305 02/04/13 2132 02/05/13 0620  BP: 135/71 143/69 143/97 137/81  Pulse: 60 69 66 72  Temp: 98.1 F (36.7 C) 97.6 F (36.4 C) 99.1 F (37.3 C) 98.4 F (36.9 C)  TempSrc: Oral Oral Oral   Resp: 16 12 19 18   Height:      Weight:      SpO2: 100% 99% 99% 94%    Intake/Output Summary (Last 24 hours) at 02/05/13 1307 Last data filed at 02/05/13 0636  Gross per 24 hour  Intake   2770 ml  Output   1500 ml  Net   1270 ml   Filed Weights   02/02/13 1012  Weight: 69.809 kg (153 lb 14.4 oz)    Exam:   General:  No distress.  Cardiovascular: S 1, S 2 RRR  Respiratory: CTA  Abdomen: BS present, soft, NT  Musculoskeletal: flank pain on palpation.   Data Reviewed: Basic Metabolic Panel:  Recent Labs Lab 02/01/13 2112 02/02/13 0904 02/03/13 0535 02/04/13 0906 02/05/13 0550  NA 140 143 143 140 140  K 3.3* 3.6 3.7 3.4* 3.9  CL 101 104 107 101 102  CO2 28 28 27 31  32  GLUCOSE 100* 92 93 126* 100*  BUN 14 12 8 6 9   CREATININE 1.14 1.12  1.03 1.04 1.16  CALCIUM 9.7 8.7 8.9 9.2 8.9   Liver Function Tests:  Recent Labs Lab 02/01/13 2112 02/02/13 0904  AST 15 14  ALT 17 13  ALKPHOS 75 70  BILITOT 0.2* 0.2*  PROT 8.1 7.2  ALBUMIN 3.2* 2.6*    Recent Labs Lab 02/01/13 2112  LIPASE 39   CBC:  Recent Labs Lab 02/01/13 2112 02/02/13 0904 02/03/13 0535 02/04/13 0906 02/05/13 0550  WBC 13.9* 9.1 7.6 6.1 7.2  NEUTROABS 10.2* 6.0  --   --   --   HGB 12.4* 11.2* 10.8* 11.6* 11.5*  HCT 34.7* 32.6* 31.9* 33.4* 33.5*  MCV 77.6* 79.3 79.6 79.7 79.6  PLT 334 292 277 320 295   Cardiac Enzymes: No results found for this basename: CKTOTAL, CKMB, CKMBINDEX, TROPONINI,  in the last 168 hours BNP (last 3 results) No results found for this basename: PROBNP,  in the last 8760 hours CBG: No results found for this basename: GLUCAP,  in the last 168 hours     Studies: No results found.  Scheduled Meds: . cefTRIAXone (ROCEPHIN)  IV  1 g Intravenous Q12H   Continuous Infusions: . sodium chloride 100 mL/hr at 02/05/13 0801    Principal Problem:   Flank pain Active Problems:   Hematuria   Acute pyelonephritis- bilateral  UTI (urinary tract infection)    Time spent: 25 minutes     Korina Tretter  [facility] Pager [phone]. If 7PM-7AM, please contact night-coverage at [email or web address], password [facility] 02/05/2013, 1:07 PM  LOS: 4 days

## 2013-02-06 LAB — SEDIMENTATION RATE: Sed Rate: 119 mm/hr — ABNORMAL HIGH (ref 0–16)

## 2013-02-06 LAB — BASIC METABOLIC PANEL
CO2: 30 mEq/L (ref 19–32)
GFR calc non Af Amer: >90 mL/min (ref >90)
Glucose, Bld: 91 mg/dL (ref 70–99)
Potassium: 3.6 mEq/L (ref 3.5–5.1)
Sodium: 141 mEq/L (ref 135–145)

## 2013-02-06 LAB — CBC
Hemoglobin: 11.8 g/dL — ABNORMAL LOW (ref 13.0–17.0)
RBC: 4.28 MIL/uL (ref 4.22–5.81)

## 2013-02-06 MED ORDER — HYDROMORPHONE HCL PF 1 MG/ML IJ SOLN
1.0000 mg | INTRAMUSCULAR | Status: DC | PRN
  Administered 2013-02-06 – 2013-02-07 (×5): 1 mg via INTRAVENOUS
  Filled 2013-02-06 (×4): qty 1

## 2013-02-06 MED ORDER — LISINOPRIL 10 MG PO TABS
10.0000 mg | ORAL_TABLET | Freq: Every day | ORAL | Status: DC
  Administered 2013-02-07 – 2013-02-08 (×2): 10 mg via ORAL
  Filled 2013-02-06 (×2): qty 1

## 2013-02-06 MED ORDER — DOCUSATE SODIUM 100 MG PO CAPS
100.0000 mg | ORAL_CAPSULE | Freq: Two times a day (BID) | ORAL | Status: DC
  Administered 2013-02-06 – 2013-02-08 (×5): 100 mg via ORAL
  Filled 2013-02-06 (×5): qty 1

## 2013-02-06 MED ORDER — HYDROMORPHONE HCL PF 1 MG/ML IJ SOLN
1.0000 mg | INTRAMUSCULAR | Status: AC
  Filled 2013-02-06 (×2): qty 1

## 2013-02-06 MED ORDER — POLYETHYLENE GLYCOL 3350 17 G PO PACK
17.0000 g | PACK | Freq: Two times a day (BID) | ORAL | Status: DC
  Administered 2013-02-06 – 2013-02-08 (×5): 17 g via ORAL
  Filled 2013-02-06 (×6): qty 1

## 2013-02-06 NOTE — Progress Notes (Signed)
Pt was heard yelling all the way down the hall.  Pt's wife and mother at the bedside crying.  Pt holding both hands on flank areas and screaming in pain.  RN took patient more IV Dilaudid.  As RN began to push IV Dilaudid slowly, pt immediately got quiet and stopped crying.  RN paused the push of medicine to ask wife to translate to see if everything was OK with patient.  Wife translated back to RN that pt "usually feels better right when they give him the pain medicine".  RN stated back to wife, that he hasn't received any yet.  Wife then states that "even when he's just had a little of the medicine, he already feels better."    RN called Dr. Sunnie Nielsen to inform her that the past 2 times that patient has screamed until he got pain medicine.  Then once RN even barely began to push it in, he stops screaming.

## 2013-02-06 NOTE — Progress Notes (Signed)
Patient with complaints of extensive lower back pain every hour; patient yells out loudly when in pain and immediately stops when medication is administered; explained to family that pain medication cannot be given every hour; family verbalizes understanding, but concerned.

## 2013-02-06 NOTE — Progress Notes (Signed)
[facility] PROGRESS NOTE  [name] ZOX:[id number] DOB: [birth year] DOA: 02/01/2013 PCP: No PCP Per Patient  Assessment/Plan: 1. Flank pain; ? IGA nephropathy.  Urine culture no growth. Gc/Chlamydia negative.  CT with bladder hypertrophy vs cystitis.  WBC normal.  MRI lumbar spine show facet diseases, less likely this causing pain. Cystoscopy out patient. observed off antibiotics. Started on lovaza and lisinopril.  2. Hematuria, proteinuria: ANA negative, hepatitis B and C negative. 24 hour Protein in urine pending. If more than 1 gr. 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  3. DVT Prophylaxis: SCD's.  4. Constipation: Miralax, docusate.   Code Status: Full Code.  Family Communication: Wife at bedside, care discussed with her. I also spoke with friend of family who is IR. Patient was ok with given information to the friend of family.  Disposition Plan: home when stable.    Consultants:  urologist  Procedures:  Renal US: negative renal US.   Antibiotics:  Ceftriaxone 4/1---4-4  HPI/Subjective: Patient was walking in the hall with wife. After that he started to have severe pain. We have to given extra 1 Mg of IV dilaudid, pain was relieved imidiatly.   Objective: Filed Vitals:   02/05/13 0620 02/05/13 1342 02/05/13 2145 02/06/13 0638  BP: 137/81 131/75 144/85 150/89  Pulse: 72 89 70 77  Temp: 98.4 F (36.9 C) 98.7 F (37.1 C) 98.5 F (36.9 C) 98.4 F (36.9 C)  TempSrc:  Oral Oral Oral  Resp: 18 17 18 18   Height:      Weight:      SpO2: 94% 97% 100% 99%    Intake/Output Summary (Last 24 hours) at 02/06/13 1157 Last data filed at 02/06/13 8119  Gross per 24 hour  Intake 1891.67 ml  Output   1800 ml  Net  91.67 ml   Filed Weights   02/02/13 1012  Weight: 69.809 kg (153 lb 14.4 oz)    Exam:   General:  No distress.  Cardiovascular: S 1, S 2 RRR  Respiratory: CTA  Abdomen: BS present, soft, NT  Musculoskeletal: flank pain on palpation.   Data  Reviewed: Basic Metabolic Panel:  Recent Labs Lab 02/02/13 0904 02/03/13 0535 02/04/13 0906 02/05/13 0550 02/06/13 0620  NA 143 143 140 140 141  K 3.6 3.7 3.4* 3.9 3.6  CL 104 107 101 102 104  CO2 28 27 31  32 30  GLUCOSE 92 93 126* 100* 91  BUN 12 8 6 9 8   CREATININE 1.12 1.03 1.04 1.16 0.97  CALCIUM 8.7 8.9 9.2 8.9 9.0   Liver Function Tests:  Recent Labs Lab 02/01/13 2112 02/02/13 0904  AST 15 14  ALT 17 13  ALKPHOS 75 70  BILITOT 0.2* 0.2*  PROT 8.1 7.2  ALBUMIN 3.2* 2.6*    Recent Labs Lab 02/01/13 2112  LIPASE 39   CBC:  Recent Labs Lab 02/01/13 2112 02/02/13 0904 02/03/13 0535 02/04/13 0906 02/05/13 0550 02/06/13 0620  WBC 13.9* 9.1 7.6 6.1 7.2 7.7  NEUTROABS 10.2* 6.0  --   --   --   --   HGB 12.4* 11.2* 10.8* 11.6* 11.5* 11.8*  HCT 34.7* 32.6* 31.9* 33.4* 33.5* 34.0*  MCV 77.6* 79.3 79.6 79.7 79.6 79.4  PLT 334 292 277 320 295 289   Cardiac Enzymes: No results found for this basename: CKTOTAL, CKMB, CKMBINDEX, TROPONINI,  in the last 168 hours BNP (last 3 results) No results found for this basename: PROBNP,  in the last 8760 hours CBG: No results found for this  basename: GLUCAP,  in the last 168 hours     Studies: Mr Lumbar Spine Wo Contrast  02/05/2013  *RADIOLOGY REPORT*  Clinical Data: Low back and bilateral flank pain for 1 week.  MRI LUMBAR SPINE WITHOUT CONTRAST  Technique:  Multiplanar and multiecho pulse sequences of the lumbar spine were obtained without intravenous contrast.  Comparison: Abdominal pelvic CT 02/02/2013.  Findings: CT demonstrates five lumbar type vertebral bodies.  There is a mild convex left scoliosis.  There is no evidence of acute fracture or pars defect.  The conus medullaris extends to the L1 level and appears normal. There are no paraspinal abnormalities.  All of the lumbar discs are well hydrated.  There is no disc herniation, spinal stenosis or nerve root encroachment.  There are early facet degenerative  changes with small facet joint effusions bilaterally from L3-L4 through L5-S1.  There is no periarticular fluid collection or inflammatory change.  IMPRESSION:  1.  Facet disease in the lower lumbar spine may contribute to axial back pain. 2.  Negative for disc herniation, spinal stenosis or nerve root encroachment.  All of the lumbar discs appear normal.   Original Report Authenticated By: Carey Bullocks, M.D.     Scheduled Meds: . lisinopril  5 mg Oral Daily  . omega-3 acid ethyl esters  2 g Oral BID   Continuous Infusions: . sodium chloride 100 mL/hr at 02/06/13 1610    Principal Problem:   Flank pain Active Problems:   Hematuria   Acute pyelonephritis- bilateral   UTI (urinary tract infection)    Time spent: 25 minutes     Shulamit Donofrio  [facility] Pager [phone]. If 7PM-7AM, please contact night-coverage at [email or web address], password [facility] 02/06/2013, 11:57 AM  LOS: 5 days

## 2013-02-06 NOTE — Progress Notes (Addendum)
[facility]  Subjective:  Awake, wife, parents, and brother at bedside.  Episode of flank pain earlier today   Objective: Vital signs in last 24 hours: Blood pressure 150/89, pulse 77, temperature 98.4 F (36.9 C), temperature source Oral, resp. rate 18, height 5\' 7"  (1.702 m), weight 69.809 kg (153 lb 14.4 oz), SpO2 99.00%.    PHYSICAL EXAM General--as above Chest--clear Heart--no rub Abd--nontender except B CVA tenderness Extr--no edema  Lab Results:   Recent Labs Lab 02/04/13 0906 02/05/13 0550 02/06/13 0620  NA 140 140 141  K 3.4* 3.9 3.6  CL 101 102 104  CO2 31 32 30  BUN 6 9 8   CREATININE 1.04 1.16 0.97  GLUCOSE 126* 100* 91  CALCIUM 9.2 8.9 9.0     Recent Labs  02/05/13 0550 02/06/13 0620  WBC 7.2 7.7  HGB 11.5* 11.8*  HCT 33.5* 34.0*  PLT 295 289     I have reviewed the patient's current medications. Scheduled: . docusate sodium  100 mg Oral BID  . lisinopril  5 mg Oral Daily  . omega-3 acid ethyl esters  2 g Oral BID  . polyethylene glycol  17 g Oral BID   Continuous: . sodium chloride 100 mL/hr at 02/06/13 0635    Assessment/Plan: 1. Flank pain/hematuria 2. Proteinuria 3. Mild elevation of BP       .          I'd sugest increasing lisinopril to 10/d as long as no drop in BP and no                                              increase in K or Cr.  Continue with Lovaza 2 G BID.  I told his wife Lovaza is  expensive and that she could get Fish oil caps at drug store or [employer] equivalent to 2 g EPA per day and that would be less expensive. I'd keep him here another day or 2 to see if ACE inhib and fish oil help any with flank pain.   LOS: 5 days   Daielle Melcher F 02/06/2013,12:50 PM   .labalb

## 2013-02-07 LAB — RENAL FUNCTION PANEL
CO2: 31 mEq/L (ref 19–32)
GFR calc Af Amer: >90 mL/min (ref >90)
GFR calc non Af Amer: >90 mL/min (ref >90)
Glucose, Bld: 91 mg/dL (ref 70–99)
Phosphorus: 3.9 mg/dL (ref 2.3–4.6)
Potassium: 3.8 mEq/L (ref 3.5–5.1)
Sodium: 141 mEq/L (ref 135–145)

## 2013-02-07 MED ORDER — HYDROCODONE-ACETAMINOPHEN 10-325 MG PO TABS
1.0000 | ORAL_TABLET | ORAL | Status: DC | PRN
  Administered 2013-02-07 (×2): 1 via ORAL
  Filled 2013-02-07 (×2): qty 1

## 2013-02-07 NOTE — Progress Notes (Signed)
[facility]  Subjective:  No complaints of flank pain today. I reviewed CT (contrast) with Dr. 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 rate 18, height 5\' 7"  (1.702 m), weight 69.809 kg (153 lb 14.4 oz), SpO2 98.00%.    PHYSICAL EXAM General--as above  Chest--clear  Heart--no rub  Abd--less CVA tenderness Extr--no edema   Lab Results:   Recent Labs Lab 02/05/13 0550 02/06/13 0620 02/07/13 0624  NA 140 141 141  K 3.9 3.6 3.8  CL 102 104 104  CO2 32 30 31  BUN 9 8 8   CREATININE 1.16 0.97 1.01  GLUCOSE 100* 91 91  CALCIUM 8.9 9.0 8.9  PHOS  --   --  3.9     Recent Labs  02/05/13 0550 02/06/13 0620  WBC 7.2 7.7  HGB 11.5* 11.8*  HCT 33.5* 34.0*  PLT 295 289     I have reviewed the patient's current medications. Scheduled: . docusate sodium  100 mg Oral BID  . lisinopril  10 mg Oral Daily  . omega-3 acid ethyl esters  2 g Oral BID  . polyethylene glycol  17 g Oral BID   Continuous: . sodium chloride 100 mL/hr at 02/07/13 0535     Assessment/Plan:  1. Flank pain/hematuria--continue lisinopril 10/d and fish oil (lovaza 2 BID) or OTC enough for 2 g EPA/d in 2 divided doses.  PRN PO analgesics.   2. Proteinuria--    " 3. Mild elevation of BP--"     I'll F/u with him in office in 3-4 weeks.   I'll call his wife to make appt.  OK to d/c IV Fluids.    LOS: 6 days   Ariauna Farabee F 02/07/2013,1:10 PM   .labalb

## 2013-02-07 NOTE — Progress Notes (Signed)
Pt has not had any IV Dilaudid since 0940 this AM.  Pt had a dose of Oxy IR at 1152, and rested well after that (until 1538).  Then we tried the new order for Norco 10/325 and pt stated "that really helped him".  He was much more pleasant and smiling the rest of the evening.  Will cont to monitor.

## 2013-02-07 NOTE — Progress Notes (Signed)
[facility] PROGRESS NOTE  Gary Cox WUJ:[id number] DOB: 04-08-1986 DOA: 02/01/2013 PCP: No PCP Per Patient  Assessment/Plan: 1. 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  Loin hematuria pain syndrome:  Urine culture no growth. Gc/Chlamydia negative.  CT with bladder hypertrophy vs cystitis.  WBC normal.  MRI lumbar spine show facet diseases. Cystoscopy out patient. observed off antibiotics. Started on lovaza and lisinopril. Pain better. Will try oral pain medication.   2. DVT Prophylaxis: SCD's.  3. Constipation: Miralax, docusate.   Code Status: Full Code.  Family Communication: Care discussed directly with patient and family who were at bedside.  Disposition Plan: home when stable.    Consultants:  urologist  Procedures:  Renal US: negative renal US.   Antibiotics:  Ceftriaxone 4/1---4-4  HPI/Subjective: Pain better. I use the translator line. I explain to patient medical condition.  He had a BM. No other complaints.   Objective: Filed Vitals:   02/06/13 0638 02/06/13 1407 02/06/13 2115 02/07/13 0530  BP: 150/89 123/73 140/80 124/72  Pulse: 77 62 65 61  Temp: 98.4 F (36.9 C) 98.6 F (37 C) 98.8 F (37.1 C) 98.6 F (37 C)  TempSrc: Oral Oral Oral   Resp: 18 18 18 18   Height:      Weight:      SpO2: 99% 98% 98% 98%    Intake/Output Summary (Last 24 hours) at 02/07/13 1440 Last data filed at 02/07/13 0900  Gross per 24 hour  Intake    480 ml  Output   1475 ml  Net   -995 ml   Filed Weights   02/02/13 1012  Weight: 69.809 kg (153 lb 14.4 oz)    Exam:   General:  No distress.  Cardiovascular: S 1, S 2 RRR  Respiratory: CTA  Abdomen: BS present, soft, NT  Musculoskeletal: flank pain on palpation.   Data Reviewed: Basic Metabolic Panel:  Recent Labs Lab 02/03/13 0535 02/04/13 0906 02/05/13 0550 02/06/13 0620 02/07/13 0624  NA 143 140 140 141 141  K 3.7 3.4* 3.9 3.6 3.8  CL 107 101 102 104 104  CO2 27  31 32 30 31  GLUCOSE 93 126* 100* 91 91  BUN 8 6 9 8 8   CREATININE 1.03 1.04 1.16 0.97 1.01  CALCIUM 8.9 9.2 8.9 9.0 8.9  PHOS  --   --   --   --  3.9   Liver Function Tests:  Recent Labs Lab 02/01/13 2112 02/02/13 0904 02/07/13 0624  AST 15 14  --   ALT 17 13  --   ALKPHOS 75 70  --   BILITOT 0.2* 0.2*  --   PROT 8.1 7.2  --   ALBUMIN 3.2* 2.6* 2.6*    Recent Labs Lab 02/01/13 2112  LIPASE 39   CBC:  Recent Labs Lab 02/01/13 2112 02/02/13 0904 02/03/13 0535 02/04/13 0906 02/05/13 0550 02/06/13 0620  WBC 13.9* 9.1 7.6 6.1 7.2 7.7  NEUTROABS 10.2* 6.0  --   --   --   --   HGB 12.4* 11.2* 10.8* 11.6* 11.5* 11.8*  HCT 34.7* 32.6* 31.9* 33.4* 33.5* 34.0*  MCV 77.6* 79.3 79.6 79.7 79.6 79.4  PLT 334 292 277 320 295 289   Cardiac Enzymes: No results found for this basename: CKTOTAL, CKMB, CKMBINDEX, TROPONINI,  in the last 168 hours BNP (last 3 results) No results found for this basename: PROBNP,  in the last 8760 hours CBG: No results found for this basename: GLUCAP,  in the last 168 hours     Studies: Mr Lumbar Spine Wo Contrast  02/05/2013  *RADIOLOGY REPORT*  Clinical Data: Low back and bilateral flank pain for 1 week.  MRI LUMBAR SPINE WITHOUT CONTRAST  Technique:  Multiplanar and multiecho pulse sequences of the lumbar spine were obtained without intravenous contrast.  Comparison: Abdominal pelvic CT 02/02/2013.  Findings: CT demonstrates five lumbar type vertebral bodies.  There is a mild convex left scoliosis.  There is no evidence of acute fracture or pars defect.  The conus medullaris extends to the L1 level and appears normal. There are no paraspinal abnormalities.  All of the lumbar discs are well hydrated.  There is no disc herniation, spinal stenosis or nerve root encroachment.  There are early facet degenerative changes with small facet joint effusions bilaterally from L3-L4 through L5-S1.  There is no periarticular fluid collection or inflammatory  change.  IMPRESSION:  1.  Facet disease in the lower lumbar spine may contribute to axial back pain. 2.  Negative for disc herniation, spinal stenosis or nerve root encroachment.  All of the lumbar discs appear normal.   Original Report Authenticated By: Carey Bullocks, M.D.     Scheduled Meds: . docusate sodium  100 mg Oral BID  . lisinopril  10 mg Oral Daily  . omega-3 acid ethyl esters  2 g Oral BID  . polyethylene glycol  17 g Oral BID   Continuous Infusions:    Principal Problem:   Flank pain Active Problems:   Hematuria   Acute pyelonephritis- bilateral   UTI (urinary tract infection)    Time spent: 25 minutes     Maddalyn Lutze  [facility] Pager [phone]. If 7PM-7AM, please contact night-coverage at [email or web address], password [facility] 02/07/2013, 2:40 PM  LOS: 6 days

## 2013-02-08 DIAGNOSIS — R319 Hematuria, unspecified: Secondary | ICD-10-CM

## 2013-02-08 DIAGNOSIS — R109 Unspecified abdominal pain: Secondary | ICD-10-CM

## 2013-02-08 LAB — CULTURE, BLOOD (ROUTINE X 2): Culture: NO GROWTH

## 2013-02-08 LAB — MPO/PR-3 (ANCA) ANTIBODIES
Myeloperoxidase Abs: <1 AU/mL (ref <20)
Serine Protease 3: 3 AU/mL (ref <20)

## 2013-02-08 MED ORDER — HYDROCODONE-ACETAMINOPHEN 10-325 MG PO TABS: 1.0000 | ORAL_TABLET | Freq: Four times a day (QID) | ORAL | Status: DC | PRN

## 2013-02-08 MED ORDER — HYDROCODONE-ACETAMINOPHEN 10-325 MG PO TABS: 1.0000 | ORAL_TABLET | ORAL | Status: DC | PRN

## 2013-02-08 MED ORDER — LISINOPRIL 10 MG PO TABS: 10.0000 mg | ORAL_TABLET | Freq: Every day | ORAL | Status: DC

## 2013-02-08 MED ORDER — OMEGA-3-ACID ETHYL ESTERS 1 G PO CAPS: 2.0000 g | ORAL_CAPSULE | Freq: Two times a day (BID) | ORAL | Status: DC

## 2013-02-08 MED ORDER — DSS 100 MG PO CAPS: 100.0000 mg | ORAL_CAPSULE | Freq: Two times a day (BID) | ORAL | Status: DC

## 2013-02-08 NOTE — Discharge Summary (Addendum)
Physician Discharge Summary  Gary Cox ZOX:[id number] DOB: 03/27/1986 DOA: 02/01/2013  PCP: No PCP Per Patient  Admit date: 02/01/2013 Discharge date: 02/08/2013  Time spent: 35  minutes  Recommendations for Outpatient Follow-up:  1. Follow up with Dr Caryn Section and Dr Julien Girt.   Discharge Diagnoses:    Loin hematuria pain syndrome.    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 male with no significant past medical history who is only from [place] and has been living in the [place] for last one year, has been experiencing bilateral flank pain over the last 2 days. The pain has been gradually worsening with nausea denies any vomiting fever chills dysuria or diarrhea. Patient has been having hematuria along with the flank pain. In the ER UA reveals hematuria and blood work shows mild leukocytosis. CT abdomen and pelvis with contrast only shows possibility of cystitis. Patient at this time has been started on ceftriaxone empirically and since patient has received multiple doses of pain medication has been admitted for further management. Patient states he had a similar episode last year. Denies any chest pain or shortness of breath. Denies any penile discharge or scrotal pain.    Hospital Course:  Patient admitted with bilateral flank pain initially it was thought to be secondary to pyelonephritis but urine culture was negative. Patient was also notice to have hematuria and proteinuria. He had a CT scan that  show Nonspecific bladder wall thickening which could be due to hypertrophy or cystitis. CT scan review by Dr Julien Girt  and radiologist conclude that wall thickening of bladder could be to bladder not being distended. Patient need to follow up with  Dr. Julien Girt for possible cystoscopy outpatient. Patient had and MRI that show: Facet disease in the lower lumbar spine may contribute to axial back pain. Negative for disc herniation, spinal stenosis or nerve root encroachment. All of the lumbar discs appear normal. The diagnosis at this time is loin hematuria pain syndrome. 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 Patient was started on lovaza and lisinopril. He will follow in 4 weeks with Dr Caryn Section to reassess proteinuria and consideration for renal biopsy. Patient has been doing well with pain medication.    2. 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 Loin hematuria pain syndrome: Urine culture no growth. Gc/Chlamydia negative. CT with bladder hypertrophy vs cystitis. WBC normal. MRI lumbar spine show facet diseases. Cystoscopy out patient. observed off antibiotics. Started on lovaza and lisinopril. Pain better. Pain controlled on pain  Medications.  3. DVT Prophylaxis: SCD's.  4. Constipation: Miralax, docusate. 5.   Procedures: Renal US: Negative renal ultrasound.   Consultations:  Dr Caryn Section  Dr Julien Girt  Discharge Exam: Filed Vitals:   02/07/13 0530 02/07/13 1504 02/07/13 2203 02/08/13 0620  BP: 124/72 122/73 127/70 123/74  Pulse: 61 64 56 59  Temp: 98.6 F (37 C) 98.4 F (36.9 C) 98.3 F (36.8 C) 98.1 F (36.7 C)  TempSrc:  Oral Oral   Resp: 18 16 18 18   Height:      Weight:      SpO2: 98% 98% 97% 96%    General: no distress.  Cardiovascular: S 1, S 2 RRR Respiratory: CTA  Discharge Instructions  Discharge Orders  Future Orders Complete By Expires     Diet - low sodium heart healthy  As directed     Increase activity slowly  As directed         Medication List    TAKE these medications       DSS 100 MG Caps  Take 100 mg by mouth 2 (two) times daily.     HYDROcodone-acetaminophen 10-325 MG per tablet  Commonly known as:  NORCO  Take 1 tablet by mouth every 6 (four) hours as needed.     lisinopril  10 MG tablet  Commonly known as:  PRINIVIL,ZESTRIL  Take 1 tablet (10 mg total) by mouth daily.     omega-3 acid ethyl esters 1 G capsule  Commonly known as:  LOVAZA  Take 2 capsules (2 g total) by mouth 2 (two) times daily.          The results of significant diagnostics from this hospitalization (including imaging, microbiology, ancillary and laboratory) are listed below for reference.    Significant Diagnostic Studies: Mr Lumbar Spine Wo Contrast  02/05/2013  *RADIOLOGY REPORT*  Clinical Data: Low back and bilateral flank pain for 1 week.  MRI LUMBAR SPINE WITHOUT CONTRAST  Technique:  Multiplanar and multiecho pulse sequences of the lumbar spine were obtained without intravenous contrast.  Comparison: Abdominal pelvic CT 02/02/2013.  Findings: CT demonstrates five lumbar type vertebral bodies.  There is a mild convex left scoliosis.  There is no evidence of acute fracture or pars defect.  The conus medullaris extends to the L1 level and appears normal. There are no paraspinal abnormalities.  All of the lumbar discs are well hydrated.  There is no disc herniation, spinal stenosis or nerve root encroachment.  There are early facet degenerative changes with small facet joint effusions bilaterally from L3-L4 through L5-S1.  There is no periarticular fluid collection or inflammatory change.  IMPRESSION:  1.  Facet disease in the lower lumbar spine may contribute to axial back pain. 2.  Negative for disc herniation, spinal stenosis or nerve root encroachment.  All of the lumbar discs appear normal.   Original Report Authenticated By: Carey Bullocks, M.D.    Ct Abdomen Pelvis W Contrast  02/07/2013  **ADDENDUM** CREATED: 02/07/2013 12:05:51  The exam has been reviewed at the request of Dr. Caryn Section for possible nutcracker syndrome given left-sided flank and lower quadrant pain as well as hematuria. The CT study shows normal course of the left renal vein with no significant compression between the SMA trunk  and the aorta.  No collateral vein development is identified. The left renal vein is normally patent.  There are no imaging findings to support the presence of significant nutcracker syndrome.  **END ADDENDUM** SIGNED BY: Sherrine Maples T. Fredia Sorrow, M.D.   02/02/2013  *RADIOLOGY REPORT*  Clinical Data: Left lower quadrant and right lower quadrant pain for 1 day.  Anorexia, nausea, and vomiting.  CT ABDOMEN AND PELVIS WITH CONTRAST  Technique:  Multidetector CT imaging of the abdomen and pelvis was performed following the standard protocol during bolus administration of intravenous contrast.  Contrast: OMNIPAQUE IOHEXOL 300 MG/ML  SOLN  Comparison: None.  Findings: Dependent atelectasis in the lung bases.  The liver, spleen, gallbladder, pancreas, adrenal glands, kidneys, abdominal aorta, inferior vena cava, and retroperitoneal lymph nodes are unremarkable.  The stomach and small bowel are not abnormally distended.  Stool filled colon without distension or wall thickening.  No free air or free fluid in the abdomen. Abdominal wall musculature  appears intact.  Pelvis:  Mild prominence of prostate gland, measuring about three by 4.2 cm.  Bladder wall is diffusely thickened suggesting the bladder hypertrophy versus cystitis.  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  Diverticula in the sigmoid colon without diverticulitis.  No free or loculated pelvic fluid collections. The appendix is normal.  No significant pelvic lymphadenopathy. Normal alignment of the lumbar vertebrae.  IMPRESSION: No acute process demonstrated in the abdomen or pelvis. Nonspecific bladder wall thickening which could be due to hypertrophy or cystitis.   Original Report Authenticated By: Burman Nieves, M.D.    US Renal  02/02/2013  *RADIOLOGY REPORT*  Clinical Data: Hematuria with proteinuria, pyelonephritis  RENAL/URINARY TRACT ULTRASOUND COMPLETE  Comparison:  CT abdomen pelvis dated  02/02/2013  Findings:  Right Kidney:  Measures 12.8 cm.  No mass or hydronephrosis.  Left Kidney:  Measures 13.0 cm.  No mass or hydronephrosis.  Bladder:  Within normal limits.  IMPRESSION: Negative renal ultrasound.   Original Report Authenticated By: Charline Bills, M.D.     Microbiology:    Labs: Basic Metabolic Panel:  Recent Labs Lab 02/03/13 0535 02/04/13 0906 02/05/13 0550 02/06/13 0620 02/07/13 0624  NA 143 140 140 141 141  K 3.7 3.4* 3.9 3.6 3.8  CL 107 101 102 104 104  CO2 27 31 32 30 31  GLUCOSE 93 126* 100* 91 91  BUN 8 6 9 8 8   CREATININE 1.03 1.04 1.16 0.97 1.01  CALCIUM 8.9 9.2 8.9 9.0 8.9  PHOS  --   --   --   --  3.9   Liver Function Tests:  Recent Labs Lab 02/01/13 2112 02/02/13 0904 02/07/13 0624  AST 15 14  --   ALT 17 13  --   ALKPHOS 75 70  --   BILITOT 0.2* 0.2*  --   PROT 8.1 7.2  --   ALBUMIN 3.2* 2.6* 2.6*    Recent Labs Lab 02/01/13 2112  LIPASE 39   No results found for this basename: AMMONIA,  in the last 168 hours CBC:  Recent Labs Lab 02/01/13 2112 02/02/13 0904 02/03/13 0535 02/04/13 0906 02/05/13 0550 02/06/13 0620  WBC 13.9* 9.1 7.6 6.1 7.2 7.7  NEUTROABS 10.2* 6.0  --   --   --   --   HGB 12.4* 11.2* 10.8* 11.6* 11.5* 11.8*  HCT 34.7* 32.6* 31.9* 33.4* 33.5* 34.0*  MCV 77.6* 79.3 79.6 79.7 79.6 79.4  PLT 334 292 277 320 295 289   Cardiac Enzymes: No results found for this basename: CKTOTAL, CKMB, CKMBINDEX, TROPONINI,  in the last 168 hours BNP: BNP (last 3 results) No results found for this basename: PROBNP,  in the last 8760 hours CBG: No results found for this basename: GLUCAP,  in the last 168 hours     Signed:  Hendrix Console  [facility] 02/08/2013, 9:43 AM

## 2013-02-08 NOTE — Progress Notes (Signed)
Patient discharged to home with family.  Discharge teaching done with family present and translating.  Teaching included follow up care, medications, diet and activity.  Verbalizes understanding with no further questions.  No complaints of pain, vital signs stable.  Ambulated with family to car for discharge.

## 2015-09-15 ENCOUNTER — Emergency Department (HOSPITAL_COMMUNITY): Payer: Medicaid Other

## 2015-09-15 ENCOUNTER — Encounter (HOSPITAL_COMMUNITY): Payer: Self-pay | Admitting: Family Medicine

## 2015-09-15 ENCOUNTER — Inpatient Hospital Stay (HOSPITAL_COMMUNITY)
Admission: EM | Admit: 2015-09-15 | Discharge: 2015-09-17 | DRG: 683 | Disposition: A | Discharge status: Home / Self Care | Payer: Self-pay | Attending: Internal Medicine | Admitting: Internal Medicine

## 2015-09-15 DIAGNOSIS — Z87440 Personal history of urinary (tract) infections: Secondary | ICD-10-CM

## 2015-09-15 DIAGNOSIS — N12 Tubulo-interstitial nephritis, not specified as acute or chronic: Secondary | ICD-10-CM | POA: Diagnosis present

## 2015-09-15 DIAGNOSIS — N028 Recurrent and persistent hematuria with other morphologic changes: Secondary | ICD-10-CM | POA: Diagnosis present

## 2015-09-15 DIAGNOSIS — B349 Viral infection, unspecified: Secondary | ICD-10-CM | POA: Diagnosis present

## 2015-09-15 DIAGNOSIS — Z8249 Family history of ischemic heart disease and other diseases of the circulatory system: Secondary | ICD-10-CM

## 2015-09-15 DIAGNOSIS — F17210 Nicotine dependence, cigarettes, uncomplicated: Secondary | ICD-10-CM | POA: Diagnosis present

## 2015-09-15 DIAGNOSIS — Z9119 Patient's noncompliance with other medical treatment and regimen: Secondary | ICD-10-CM

## 2015-09-15 DIAGNOSIS — E860 Dehydration: Secondary | ICD-10-CM | POA: Diagnosis present

## 2015-09-15 DIAGNOSIS — I10 Essential (primary) hypertension: Secondary | ICD-10-CM | POA: Diagnosis present

## 2015-09-15 DIAGNOSIS — N390 Urinary tract infection, site not specified: Secondary | ICD-10-CM | POA: Diagnosis present

## 2015-09-15 DIAGNOSIS — J9811 Atelectasis: Secondary | ICD-10-CM | POA: Diagnosis present

## 2015-09-15 DIAGNOSIS — N179 Acute kidney failure, unspecified: Principal | ICD-10-CM

## 2015-09-15 LAB — URINALYSIS, ROUTINE W REFLEX MICROSCOPIC
Glucose, UA: NEGATIVE mg/dL
Ketones, ur: 15 mg/dL — AB
NITRITE: POSITIVE — AB
Specific Gravity, Urine: 1.028 (ref 1.005–1.030)
UROBILINOGEN UA: 1 mg/dL (ref 0.0–1.0)
pH: 5.5 (ref 5.0–8.0)

## 2015-09-15 LAB — CBC
HCT: 45.2 % (ref 39.0–52.0)
HEMOGLOBIN: 15.7 g/dL (ref 13.0–17.0)
MCH: 28.8 pg (ref 26.0–34.0)
MCHC: 34.7 g/dL (ref 30.0–36.0)
MCV: 82.8 fL (ref 78.0–100.0)
Platelets: 215 10*3/uL (ref 150–400)
RBC: 5.46 MIL/uL (ref 4.22–5.81)
RDW: 12.6 % (ref 11.5–15.5)
WBC: 17.7 10*3/uL — AB (ref 4.0–10.5)

## 2015-09-15 LAB — COMPREHENSIVE METABOLIC PANEL
ALBUMIN: 3.2 g/dL — AB (ref 3.5–5.0)
ALK PHOS: 86 U/L (ref 38–126)
ALT: 31 U/L (ref 17–63)
ANION GAP: 9 (ref 5–15)
AST: 27 U/L (ref 15–41)
BILIRUBIN TOTAL: 1 mg/dL (ref 0.3–1.2)
BUN: 17 mg/dL (ref 6–20)
CALCIUM: 9.3 mg/dL (ref 8.9–10.3)
CO2: 28 mmol/L (ref 22–32)
Chloride: 100 mmol/L — ABNORMAL LOW (ref 101–111)
Creatinine, Ser: 1.49 mg/dL — ABNORMAL HIGH (ref 0.61–1.24)
GLUCOSE: 105 mg/dL — AB (ref 65–99)
Potassium: 3.8 mmol/L (ref 3.5–5.1)
Sodium: 137 mmol/L (ref 135–145)
TOTAL PROTEIN: 8.2 g/dL — AB (ref 6.5–8.1)

## 2015-09-15 LAB — URINE MICROSCOPIC-ADD ON

## 2015-09-15 LAB — CK: Total CK: 63 U/L (ref 49–397)

## 2015-09-15 LAB — LIPASE, BLOOD: Lipase: 24 U/L (ref 11–51)

## 2015-09-15 MED ORDER — SODIUM CHLORIDE 0.9 % IV BOLUS (SEPSIS)
1000.0000 mL | Freq: Once | INTRAVENOUS | Status: AC
  Administered 2015-09-15: 1000 mL via INTRAVENOUS

## 2015-09-15 MED ORDER — SODIUM CHLORIDE 0.9 % IV SOLN
Freq: Once | INTRAVENOUS | Status: AC
  Administered 2015-09-15: 13:00:00 via INTRAVENOUS

## 2015-09-15 MED ORDER — TRAMADOL HCL 50 MG PO TABS
50.0000 mg | ORAL_TABLET | Freq: Four times a day (QID) | ORAL | Status: DC | PRN
  Administered 2015-09-15: 50 mg via ORAL
  Filled 2015-09-15: qty 1

## 2015-09-15 MED ORDER — DEXTROSE 5 % IV SOLN
1.0000 g | Freq: Once | INTRAVENOUS | Status: AC
  Administered 2015-09-15: 1 g via INTRAVENOUS
  Filled 2015-09-15: qty 10

## 2015-09-15 MED ORDER — MORPHINE SULFATE (PF) 4 MG/ML IV SOLN
4.0000 mg | Freq: Once | INTRAVENOUS | Status: AC
Start: 2015-09-15 — End: 2015-09-15
  Administered 2015-09-15: 4 mg via INTRAVENOUS
  Filled 2015-09-15: qty 1

## 2015-09-15 MED ORDER — DEXTROSE 5 % IV SOLN
1.0000 g | INTRAVENOUS | Status: DC
Start: 2015-09-15 — End: 2015-09-15
  Filled 2015-09-15: qty 10

## 2015-09-15 MED ORDER — MORPHINE SULFATE (PF) 4 MG/ML IV SOLN
4.0000 mg | INTRAVENOUS | Status: DC | PRN
  Administered 2015-09-15 – 2015-09-16 (×5): 4 mg via INTRAVENOUS
  Filled 2015-09-15 (×5): qty 1

## 2015-09-15 MED ORDER — MORPHINE SULFATE (PF) 4 MG/ML IV SOLN
4.0000 mg | Freq: Once | INTRAVENOUS | Status: AC
  Administered 2015-09-15: 4 mg via INTRAVENOUS
  Filled 2015-09-15: qty 1

## 2015-09-15 MED ORDER — OXYCODONE HCL 5 MG PO TABS
5.0000 mg | ORAL_TABLET | ORAL | Status: DC | PRN
Start: 2015-09-15 — End: 2015-09-17
  Administered 2015-09-15 – 2015-09-16 (×4): 5 mg via ORAL
  Filled 2015-09-15 (×4): qty 1

## 2015-09-15 MED ORDER — SODIUM CHLORIDE 0.9 % IV SOLN
INTRAVENOUS | Status: DC
  Administered 2015-09-16 – 2015-09-17 (×5): via INTRAVENOUS

## 2015-09-15 MED ORDER — ENOXAPARIN SODIUM 40 MG/0.4ML ~~LOC~~ SOLN
40.0000 mg | SUBCUTANEOUS | Status: DC
  Administered 2015-09-15: 40 mg via SUBCUTANEOUS
  Filled 2015-09-15: qty 0.4

## 2015-09-15 NOTE — ED Notes (Signed)
Pt here for four days pf pain in legs, lowers back and stomach. sts N,V,D.

## 2015-09-15 NOTE — ED Provider Notes (Signed)
CSN: [id number]     Arrival date & time 09/15/15  1038 History   First MD Initiated Contact with Patient 09/15/15 1126     Chief Complaint  Patient presents with  . Generalized Body Aches  . Emesis     (Consider location/radiation/quality/duration/timing/severity/associated sxs/prior Treatment) HPI Comments: 29 y.o. Male with history of UTI presents for back and abdominal pain.  On examination the patient is resistant to examination and keeps asking me to wait and talk to his wife when she comes back.  Reports pain in the right lower back and abdomen started a few days ago.    Per the patient's wife the patient has a history of IgA nephropathy and that when he was diagnosed with this a few years and had similar pain but it was bilateral in his back.  He is supposed to be following with nephrology and taking Lisinopril but has not been compliant.  He started to feel unwell about 3 days ago and his urine has gotten progressively dark over this time.  No fever but has had episodes of vomiting.    Patient is a 29 y.o. male presenting with vomiting.  Emesis Associated symptoms: abdominal pain and myalgias   Associated symptoms: no headaches     Past Medical History  Diagnosis Date  . UTI (urinary tract infection)    Past Surgical History  Procedure Laterality Date  . Cyst removal neck     Family History  Problem Relation Age of Onset  . Hypertension Mother   . Hypertension Father    Social History  Substance Use Topics  . Smoking status: Current Every Day Smoker -- 1.00 packs/day for 5 years    Types: Cigarettes  . Smokeless tobacco: Never Used  . Alcohol Use: No    Review of Systems  Constitutional: Positive for appetite change and fatigue. Negative for fever.  HENT: Negative for congestion, postnasal drip and rhinorrhea.   Eyes: Negative for visual disturbance.  Respiratory: Negative for chest tightness and shortness of breath.   Cardiovascular: Negative for chest pain  and palpitations.  Gastrointestinal: Positive for nausea, vomiting and abdominal pain. Negative for constipation.  Genitourinary: Positive for hematuria and flank pain (right sided). Negative for decreased urine volume.  Musculoskeletal: Positive for myalgias and back pain.  Skin: Negative for rash.  Neurological: Negative for dizziness, weakness, light-headedness and headaches.  Hematological: Does not bruise/bleed easily.      Allergies  Review of patient's allergies indicates no known allergies.  Home Medications   Prior to Admission medications   Medication Sig Start Date End Date Taking? Authorizing Provider  docusate sodium 100 MG CAPS Take 100 mg by mouth 2 (two) times daily. Patient not taking: Reported on 09/15/2015 02/08/13   Belkys A Regalado, MD  HYDROcodone-acetaminophen (NORCO) 10-325 MG per tablet Take 1 tablet by mouth every 6 (six) hours as needed. Patient not taking: Reported on 09/15/2015 02/08/13   Belkys A Regalado, MD  lisinopril (PRINIVIL,ZESTRIL) 10 MG tablet Take 1 tablet (10 mg total) by mouth daily. Patient not taking: Reported on 09/15/2015 02/08/13   Belkys A Regalado, MD  omega-3 acid ethyl esters (LOVAZA) 1 G capsule Take 2 capsules (2 g total) by mouth 2 (two) times daily. Patient not taking: Reported on 09/15/2015 02/08/13   Belkys A Regalado, MD   BP 119/64 mmHg  Pulse 85  Temp(Src) 98.3 F (36.8 C) (Oral)  Resp 16  SpO2 97% Physical Exam  Constitutional: He is oriented to person, place, and  time. He appears well-developed and well-nourished. No distress.  HENT:  Head: Normocephalic and atraumatic.  Right Ear: External ear normal.  Left Ear: External ear normal.  Mouth/Throat: Oropharynx is clear and moist. No oropharyngeal exudate.  Eyes: EOM are normal. Pupils are equal, round, and reactive to light.  Neck: Normal range of motion. Neck supple.  Cardiovascular: Normal rate, regular rhythm, normal heart sounds and intact distal pulses.   No murmur  heard. Pulmonary/Chest: Effort normal. No respiratory distress. He has no wheezes. He has no rales.  Abdominal: Soft. He exhibits no distension. There is no hepatosplenomegaly. There is tenderness in the right lower quadrant and periumbilical area. There is CVA tenderness (right sided).  Musculoskeletal: He exhibits no edema.  Neurological: He is alert and oriented to person, place, and time.  Skin: Skin is warm and dry. No rash noted. He is not diaphoretic.  Vitals reviewed.   ED Course  Procedures (including critical care time) Labs Review Labs Reviewed  COMPREHENSIVE METABOLIC PANEL - Abnormal; Notable for the following:    Chloride 100 (*)    Glucose, Bld 105 (*)    Creatinine, Ser 1.49 (*)    Total Protein 8.2 (*)    Albumin 3.2 (*)    All other components within normal limits  CBC - Abnormal; Notable for the following:    WBC 17.7 (*)    All other components within normal limits  URINALYSIS, ROUTINE W REFLEX MICROSCOPIC (NOT AT [facility]) - Abnormal; Notable for the following:    Color, Urine BROWN (*)    APPearance TURBID (*)    Hgb urine dipstick LARGE (*)    Bilirubin Urine LARGE (*)    Ketones, ur 15 (*)    Protein, ur >300 (*)    Nitrite POSITIVE (*)    Leukocytes, UA SMALL (*)    All other components within normal limits  URINE MICROSCOPIC-ADD ON - Abnormal; Notable for the following:    Squamous Epithelial / LPF FEW (*)    Bacteria, UA FEW (*)    Casts GRANULAR CAST (*)    All other components within normal limits  URINE CULTURE  LIPASE, BLOOD  CK    Imaging Review Ct Abdomen Pelvis Wo Contrast  09/15/2015  CLINICAL DATA:  Abdominal, low back and bilateral leg pain. Nausea, vomiting and diarrhea. Symptoms for 4 days. Initial encounter. EXAM: CT ABDOMEN AND PELVIS WITHOUT CONTRAST TECHNIQUE: Multidetector CT imaging of the abdomen and pelvis was performed following the standard protocol without IV contrast. COMPARISON:  CT abdomen and pelvis 02/02/2013. FINDINGS:  Mild dependent atelectasis is seen in the lung bases. No pleural or pericardial effusion. The kidneys appear normal bilaterally. No renal or ureteral stones identified. No urinary bladder stones are present. Prostate gland and seminal vesicles are unremarkable. The gallbladder, liver, spleen, adrenal glands, pancreas and biliary tree are unremarkable. The stomach, small and large bowel and appendix appear normal. No lymphadenopathy or fluid. No focal bony abnormality. IMPRESSION: Negative CT abdomen and pelvis. Electronically Signed   By: Drusilla Kanner M.D.   On: 09/15/2015 13:01   I have personally reviewed and evaluated these images and lab results as part of my medical decision-making.   EKG Interpretation None      MDM  Patient seen and evaluated in stable condition.  Extremely tender on examination and grabbed my hand secondary to pain.  Results showed +Hb/+protein/+WBC/+Nitrites in urine.  Leukocytosis and acute kidney injury on UA.  Discussed with Dr. Kathrene Bongo from nephrology whose only  recommendation at this time was IV hydration and said that his flank pain should not be secondary to nephropathy.  IV fluid hydration was continued.  As UA consistent with infection, Rocephin ordered.  Discussed with [facility] admitting team who agreed with admission and he was admitted to the Med-Surg floor. Final diagnoses:  Pyelonephritis  Dehydration    1. Acute kidney injury  2. UTI  3. Nephritis    Leta Baptist, MD 09/15/15 [phone]

## 2015-09-15 NOTE — ED Notes (Signed)
Lab [place] aware od add on CK

## 2015-09-15 NOTE — H&P (Signed)
[facility] History and Physical                                                                                    Gary Cox, is a 29 y.o. male  MRN: [id number]   DOB - 09-Feb-1986  Admit Date - 09/15/2015  Outpatient Primary MD for the patient is No PCP Per Patient  Referring MD: Cyndie Chime / ER  With History of -  Past Medical History  Diagnosis Date  . UTI (urinary tract infection)       Past Surgical History  Procedure Laterality Date  . Cyst removal neck      in for   Chief Complaint  Patient presents with  . Generalized Body Aches  . Emesis     HPI This is a 28 year old [place] male who has a known history of IgA nephropathy who presented with back pain and myalgias. He was previously hospitalized in 2014 for UTI and exacerbation of nephropathy. Patient reports several days ago he began developing bilateral lower back pain that radiated to the abdomen. This was not associated with fevers or chills, nausea or vomiting. According to the wife, who is translating for the patient, he has not been consistent with following up with medical staff and does not have a primary care physician because they do not have insurance and the patient feels that the cost involved with regular physician follow-up outweighs the benefits provided. Patient does work a strenuous job Health and safety inspector and drinks no more than 2 bottles of water per day. Also of the past several days when his back was hurting he began taking Motrin to relieve the back pain. When discussing this with the wife it appears they were unaware that given his underlying nephropathy that this classification of drug should not be utilized for pain management. In addition, the patient was supposed to be taking lisinopril but hasn't taken this in over a year because he has not been consistent with follow-up and has not had a refill available.  In the ER the patient was afebrile, hemodynamically stable and maintaining  room air saturations of 96%. Laboratory data was unremarkable except for creatinine of 1.49, white count of 17,700, no differential was done. Urinalysis was markedly abnormal with a turbid appearance, few bacteria, large bilirubin, granular cast, brown color, large hemoglobin, 15 ketones, small leukocytes, positive nitrite, greater than 300 protein, urine specific gravity 1.028, few squamous epithelials, WBC 7-10. EDP did discuss with Dr. Kathrene Bongo of nephrology who states that IgA nephropathy exacerbations alone do not cause back pain and given the appearance of urinalysis that patient likely has an associated UTI and that should be treated. Primary recommendation was for hydration.  Review of Systems   In addition to the HPI above,  No Fever-chills, myalgias or other constitutional symptoms No Headache, changes with Vision or hearing, new weakness, tingling, numbness in any extremity, No problems swallowing food or Liquids, indigestion/reflux No Chest pain, Cough or Shortness of Breath, palpitations, orthopnea or DOE No Abdominal pain, N/V; no melena or hematochezia, no dark tarry stools No dysuria, hematuria No new skin rashes, lesions, masses or bruises, No new joints pains-aches No recent  weight gain or loss No polyuria, polydypsia or polyphagia,  *A full 10 point Review of Systems was done, except as stated above, all other Review of Systems were negative.  Social History Social History  Substance Use Topics  . Smoking status: Current Every Day Smoker -- 1.00 packs/day for 5 years    Types: Cigarettes  . Smokeless tobacco: Never Used  . Alcohol Use: No    Resides at: Private residence  Lives with: Wife  Ambulatory status: Without assistive devices  Other pertinent history: Speaks only Arabic/from [place]   Family History Family History  Problem Relation Age of Onset  . Hypertension Mother   . Hypertension Father      Prior to Admission medications   Medication  Sig Start Date End Date Taking? Authorizing Provider  docusate sodium 100 MG CAPS Take 100 mg by mouth 2 (two) times daily. Patient not taking: Reported on 09/15/2015 02/08/13   Belkys A Regalado, MD  HYDROcodone-acetaminophen (NORCO) 10-325 MG per tablet Take 1 tablet by mouth every 6 (six) hours as needed. Patient not taking: Reported on 09/15/2015 02/08/13   Belkys A Regalado, MD  lisinopril (PRINIVIL,ZESTRIL) 10 MG tablet Take 1 tablet (10 mg total) by mouth daily. Patient not taking: Reported on 09/15/2015 02/08/13   Belkys A Regalado, MD  omega-3 acid ethyl esters (LOVAZA) 1 G capsule Take 2 capsules (2 g total) by mouth 2 (two) times daily. Patient not taking: Reported on 09/15/2015 02/08/13   Alba Cory, MD    No Known Allergies  Physical Exam  Vitals  Blood pressure 119/64, pulse 85, temperature 98.3 F (36.8 C), temperature source Oral, resp. rate 16, SpO2 97 %.   General:  In no acute distress, appears healthy and well nourished  Psych:  Normal affect, Denies Suicidal or Homicidal ideations, Awake Alert, Oriented X 3. Speech and thought patterns are clear and appropriate, no apparent short term memory deficits  Neuro:   No focal neurological deficits, CN II through XII intact, Strength 5/5 all 4 extremities, Sensation intact all 4 extremities.  ENT:  Ears and Eyes appear Normal, Conjunctivae clear, PER. Moist oral mucosa without erythema or exudates.  Neck:  Supple, No lymphadenopathy appreciated  Respiratory:  Symmetrical chest wall movement, Good air movement bilaterally, CTAB. Room Air  Cardiac:  RRR, No Murmurs, no LE edema noted, no JVD, No carotid bruits, peripheral pulses palpable at 2+  Abdomen:  Positive bowel sounds, Soft, Non tender, Non distended,  No masses appreciated, no obvious hepatosplenomegaly  Genitourinary: Positive bilateral CVAT left greater than right  Skin:  No Cyanosis, Normal Skin Turgor, No Skin Rash or Bruise.  Extremities: Symmetrical  without obvious trauma or injury,  no effusions.  Data Review  CBC  Recent Labs Lab 09/15/15 1140  WBC 17.7*  HGB 15.7  HCT 45.2  PLT 215  MCV 82.8  MCH 28.8  MCHC 34.7  RDW 12.6    Chemistries   Recent Labs Lab 09/15/15 1140  NA 137  K 3.8  CL 100*  CO2 28  GLUCOSE 105*  BUN 17  CREATININE 1.49*  CALCIUM 9.3  AST 27  ALT 31  ALKPHOS 86  BILITOT 1.0    CrCl cannot be calculated (Unknown ideal weight.).  No results for input(s): TSH, T4TOTAL, T3FREE, THYROIDAB in the last 72 hours.  Invalid input(s): FREET3  Coagulation profile No results for input(s): INR, PROTIME in the last 168 hours.  No results for input(s): DDIMER in the last 72 hours.  Cardiac Enzymes No results for input(s): CKMB, TROPONINI, MYOGLOBIN in the last 168 hours.  Invalid input(s): CK  Invalid input(s): POCBNP  Urinalysis    Component Value Date/Time   COLORURINE BROWN* 09/15/2015 1255   APPEARANCEUR TURBID* 09/15/2015 1255   LABSPEC 1.028 09/15/2015 1255   PHURINE 5.5 09/15/2015 1255   GLUCOSEU NEGATIVE 09/15/2015 1255   HGBUR LARGE* 09/15/2015 1255   BILIRUBINUR LARGE* 09/15/2015 1255   KETONESUR 15* 09/15/2015 1255   PROTEINUR >300* 09/15/2015 1255   UROBILINOGEN 1.0 09/15/2015 1255   NITRITE POSITIVE* 09/15/2015 1255   LEUKOCYTESUR SMALL* 09/15/2015 1255    Imaging results:   Ct Abdomen Pelvis Wo Contrast  09/15/2015  CLINICAL DATA:  Abdominal, low back and bilateral leg pain. Nausea, vomiting and diarrhea. Symptoms for 4 days. Initial encounter. EXAM: CT ABDOMEN AND PELVIS WITHOUT CONTRAST TECHNIQUE: Multidetector CT imaging of the abdomen and pelvis was performed following the standard protocol without IV contrast. COMPARISON:  CT abdomen and pelvis 02/02/2013. FINDINGS: Mild dependent atelectasis is seen in the lung bases. No pleural or pericardial effusion. The kidneys appear normal bilaterally. No renal or ureteral stones identified. No urinary bladder stones are  present. Prostate gland and seminal vesicles are unremarkable. The gallbladder, liver, spleen, adrenal glands, pancreas and biliary tree are unremarkable. The stomach, small and large bowel and appendix appear normal. No lymphadenopathy or fluid. No focal bony abnormality. IMPRESSION: Negative CT abdomen and pelvis. Electronically Signed   By: Drusilla Kanner M.D.   On: 09/15/2015 13:01      Assessment & Plan  Principal Problem:    Acute renal failure (ARF) /Chronic IgA nephropathy -Baseline renal function in 2014 BUN 8 and creatinine 0.97 with current renal function BUN 17 and creatinine 1.49 -Inconsistent follow-up secondary to patient's lack of insurance and unwillingness to pay for services out of pocket due to excessive cost -Case management consulted to assist with establishing patient with one of our community clinics -Discussed with patient with wife translating importance of managing his nephropathy especially when asymptomatic to prevent eventual transition to complete renal failure requiring dialysis-patient appeared to understand -Patient had misunderstanding that once his protein levels were stable and he was not having any type of symptoms that he did not need any further medical follow-up. It was explained to the patient that his nephropathy does not cause pain and he would only have pain when he was having a urinary tract infection -Repeat labs as well as urinalysis in a.m. -CK 63    Dehydration, moderate -Baseline hemoglobin around 12 and current hemoglobin 15.7 consistent with hemoconcentration and volume depletion -Normal saline at 150 an hour    DVT Prophylaxis: Lovenox  Family Communication:   Wife at bedside who translates for patient  Code Status:  Full code  Condition:  Stable  Discharge disposition: Anticipate discharge back to home pending medical stability; hopefully the next 24-48 hours  Time spent in minutes : 60      ELLIS,ALLISON L. ANP on  09/15/2015 at 3:21 PM  Between 7am to 7pm - Pager - [phone]  After 7pm go to [email or web address] - password [email or web address]  And look for the night coverage person covering me after hours  [facility]   I have evaluated the patient, reviewed the chart and discussed the plan with the patient, his wife and Junious Silk, NP. I have made modifications to the above note and agree with it.   29 y/o with pain in flank and body aches for about 1 wk  now followed by vomiting for the past 4 days. No fevers or chills. No dysuria or suprapubic pain. H/o IgA nephropathy suspected by nephrology in 2014.  He was discharged home on an ACE I and apparently followed up with renal for a short while but has not followed up or taken his ACE I in 1 yr. At this time, I suspect his generalized symptoms are stemming from a viral syndrome. Doubtful that he has a UTI as he is asymptomatic in this regard without dysuria or suprapubic pain. He has flank pain which is bilateral. He had a similar presentation 2 yrs ago and Urology did not feel his symptoms were stemming from a UTI at that point. Will hydrate for prerenal AKI. He has hematuria which is likely related to his IgA nephropathy.  Have spoken with Dr Lacy Duverney who will review office notes and see patient in the AM. Hold off on starting ACE I at this time. Will not continue Rocephin after the dose given in ER today. Follow clinically for now and f/u urine culture.  Calvert Cantor, MD

## 2015-09-15 NOTE — ED Notes (Signed)
MD at bedside. 

## 2015-09-15 NOTE — Progress Notes (Signed)
Patient transferred to [address]. Patient appears A/O x4. On RA. No skin issues. Patient has Iv on RAC running NS @ 150. Patient complains of back/flank pain. Will continue to monitor.

## 2015-09-16 DIAGNOSIS — N079 Hereditary nephropathy, not elsewhere classified with unspecified morphologic lesions: Secondary | ICD-10-CM

## 2015-09-16 DIAGNOSIS — N178 Other acute kidney failure: Secondary | ICD-10-CM

## 2015-09-16 LAB — COMPREHENSIVE METABOLIC PANEL
ALK PHOS: 53 U/L (ref 38–126)
ALT: 21 U/L (ref 17–63)
ANION GAP: 6 (ref 5–15)
AST: 20 U/L (ref 15–41)
Albumin: 2.2 g/dL — ABNORMAL LOW (ref 3.5–5.0)
BILIRUBIN TOTAL: 0.3 mg/dL (ref 0.3–1.2)
BUN: 15 mg/dL (ref 6–20)
CALCIUM: 8.2 mg/dL — AB (ref 8.9–10.3)
CO2: 27 mmol/L (ref 22–32)
CREATININE: 1.03 mg/dL (ref 0.61–1.24)
Chloride: 106 mmol/L (ref 101–111)
Glucose, Bld: 104 mg/dL — ABNORMAL HIGH (ref 65–99)
Potassium: 3.8 mmol/L (ref 3.5–5.1)
SODIUM: 139 mmol/L (ref 135–145)
TOTAL PROTEIN: 6.2 g/dL — AB (ref 6.5–8.1)

## 2015-09-16 LAB — URINE MICROSCOPIC-ADD ON

## 2015-09-16 LAB — URINALYSIS, ROUTINE W REFLEX MICROSCOPIC
Bilirubin Urine: NEGATIVE
GLUCOSE, UA: NEGATIVE mg/dL
KETONES UR: NEGATIVE mg/dL
Nitrite: NEGATIVE
PROTEIN: 30 mg/dL — AB
Specific Gravity, Urine: 1.016 (ref 1.005–1.030)
UROBILINOGEN UA: 0.2 mg/dL (ref 0.0–1.0)
pH: 5 (ref 5.0–8.0)

## 2015-09-16 LAB — URINE CULTURE: Culture: NO GROWTH

## 2015-09-16 LAB — CBC
HCT: 34.7 % — ABNORMAL LOW (ref 39.0–52.0)
HEMOGLOBIN: 12.1 g/dL — AB (ref 13.0–17.0)
MCH: 29.1 pg (ref 26.0–34.0)
MCHC: 34.9 g/dL (ref 30.0–36.0)
MCV: 83.4 fL (ref 78.0–100.0)
PLATELETS: 169 10*3/uL (ref 150–400)
RBC: 4.16 MIL/uL — ABNORMAL LOW (ref 4.22–5.81)
RDW: 13 % (ref 11.5–15.5)
WBC: 7.9 10*3/uL (ref 4.0–10.5)

## 2015-09-16 MED ORDER — POLYETHYLENE GLYCOL 3350 17 G PO PACK
17.0000 g | PACK | Freq: Every day | ORAL | Status: DC
  Administered 2015-09-16 – 2015-09-17 (×2): 17 g via ORAL
  Filled 2015-09-16 (×2): qty 1

## 2015-09-16 MED ORDER — OMEGA-3-ACID ETHYL ESTERS 1 G PO CAPS: 2.0000 g | ORAL_CAPSULE | Freq: Two times a day (BID) | ORAL | Status: DC

## 2015-09-16 MED ORDER — INFLUENZA VAC SPLIT QUAD 0.5 ML IM SUSY
0.5000 mL | PREFILLED_SYRINGE | INTRAMUSCULAR | Status: AC
  Administered 2015-09-17: 0.5 mL via INTRAMUSCULAR

## 2015-09-16 MED ORDER — MORPHINE SULFATE (PF) 2 MG/ML IV SOLN
1.0000 mg | INTRAVENOUS | Status: DC | PRN
  Administered 2015-09-16 (×2): 2 mg via INTRAVENOUS
  Filled 2015-09-16 (×2): qty 1

## 2015-09-16 MED ORDER — MORPHINE SULFATE (PF) 2 MG/ML IV SOLN
2.0000 mg | INTRAVENOUS | Status: DC | PRN
  Administered 2015-09-16: 2 mg via INTRAVENOUS
  Administered 2015-09-17: 4 mg via INTRAVENOUS
  Filled 2015-09-16: qty 1
  Filled 2015-09-16: qty 2

## 2015-09-16 MED ORDER — OMEGA-3-ACID ETHYL ESTERS 1 G PO CAPS
2.0000 g | ORAL_CAPSULE | Freq: Two times a day (BID) | ORAL | Status: DC
  Administered 2015-09-16 – 2015-09-17 (×3): 2 g via ORAL
  Filled 2015-09-16 (×3): qty 2

## 2015-09-16 NOTE — Progress Notes (Signed)
Critical lab value called for hgb=12.1, drop from 15.7 on 09/15/15. NP on call notified.

## 2015-09-16 NOTE — Progress Notes (Signed)
Addendum to H/P: omitted in assessment and plan that pt had TNTC RBCs in UA and that wife described urine as "brown" sos suspect has macroscopic as well as microscopic hematuria c/w presumed dx of IgA nephropathy. Formal Nephrology evaluation pending  Junious Silk, ANP

## 2015-09-16 NOTE — Consult Note (Signed)
[facility] Renal Consultation Note  Requesting MD: Regalado Indication for Consultation: hematuria  HPI:  Gary Cox is a 29 y.o. male with past medical history significant for a hospitalization in early 2014 for flank pain and hematuria. At that time a renal consultation was obtained. It was felt that the logical diagnosis was IgA nephropathy but biopsy was not done. He was felt to be in a good prognostic category with no hypertension and nonnephrotic proteinuria. He has been seen sporadically at [facility]. The last was in early 2015 by Dr. Joelyn Oms. He was felt to still be in a good prognostic category and felt to likely have IgA nephropathy. We'll was recommended was a low dose ACE inhibitor lisinopril and fish oil as much as he could tolerate. The patient's wife admits that he is not compliant with these therapies. He comes in with another episode of gross hematuria and flank pain. His initial creatinine was up at 1.49 but with hydration overnight and is now back down to normal. He is still having flank pain. Imaging was unremarkable for any abnormalities. He is felt to possibly have some kind of viral syndrome because has body aches.   CREATININE, SER  Date/Time Value Ref Range Status  09/16/2015 05:01 AM 1.03 0.61 - 1.24 mg/dL Final  09/15/2015 11:40 AM 1.49* 0.61 - 1.24 mg/dL Final  02/07/2013 06:24 AM 1.01 0.50 - 1.35 mg/dL Final  02/06/2013 06:20 AM 0.97 0.50 - 1.35 mg/dL Final  02/05/2013 05:50 AM 1.16 0.50 - 1.35 mg/dL Final  02/04/2013 09:06 AM 1.04 0.50 - 1.35 mg/dL Final  02/03/2013 05:35 AM 1.03 0.50 - 1.35 mg/dL Final  02/02/2013 09:04 AM 1.12 0.50 - 1.35 mg/dL Final  02/01/2013 09:12 PM 1.14 0.50 - 1.35 mg/dL Final     PMHx:   Past Medical History  Diagnosis Date  . UTI (urinary tract infection)     Past Surgical History  Procedure Laterality Date  . Cyst removal neck      Family Hx:  Family History  Problem Relation Age of  Onset  . Hypertension Mother   . Hypertension Father     Social History:  reports that he has been smoking Cigarettes.  He has a 5 pack-year smoking history. He has never used smokeless tobacco. He reports that he does not drink alcohol or use illicit drugs.  Allergies: No Known Allergies  Medications: Prior to Admission medications   Medication Sig Start Date End Date Taking? Authorizing Provider  docusate sodium 100 MG CAPS Take 100 mg by mouth 2 (two) times daily. Patient not taking: Reported on 09/15/2015 02/08/13   Belkys A Regalado, MD  HYDROcodone-acetaminophen (NORCO) 10-325 MG per tablet Take 1 tablet by mouth every 6 (six) hours as needed. Patient not taking: Reported on 09/15/2015 02/08/13   Belkys A Regalado, MD  lisinopril (PRINIVIL,ZESTRIL) 10 MG tablet Take 1 tablet (10 mg total) by mouth daily. Patient not taking: Reported on 09/15/2015 02/08/13   Belkys A Regalado, MD  omega-3 acid ethyl esters (LOVAZA) 1 G capsule Take 2 capsules (2 g total) by mouth 2 (two) times daily. Patient not taking: Reported on 09/15/2015 02/08/13   Elmarie Shiley, MD    I have reviewed the patient's current medications.  Labs:  Results for orders placed or performed during the hospital encounter of 09/15/15 (from the past 48 hour(s))  Lipase, blood     Status: None   Collection Time: 09/15/15 11:40 AM  Result Value Ref Range  Lipase 24 11 - 51 U/L  Comprehensive metabolic panel     Status: Abnormal   Collection Time: 09/15/15 11:40 AM  Result Value Ref Range   Sodium 137 135 - 145 mmol/L   Potassium 3.8 3.5 - 5.1 mmol/L   Chloride 100 (L) 101 - 111 mmol/L   CO2 28 22 - 32 mmol/L   Glucose, Bld 105 (H) 65 - 99 mg/dL   BUN 17 6 - 20 mg/dL   Creatinine, Ser 1.49 (H) 0.61 - 1.24 mg/dL   Calcium 9.3 8.9 - 10.3 mg/dL   Total Protein 8.2 (H) 6.5 - 8.1 g/dL   Albumin 3.2 (L) 3.5 - 5.0 g/dL   AST 27 15 - 41 U/L   ALT 31 17 - 63 U/L   Alkaline Phosphatase 86 38 - 126 U/L   Total Bilirubin 1.0  0.3 - 1.2 mg/dL   GFR calc non Af Amer >60 >60 mL/min   GFR calc Af Amer >60 >60 mL/min    Comment: (NOTE) The eGFR has been calculated using the CKD EPI equation. This calculation has not been validated in all clinical situations. eGFR's persistently <60 mL/min signify possible Chronic Kidney Disease.    Anion gap 9 5 - 15  CBC     Status: Abnormal   Collection Time: 09/15/15 11:40 AM  Result Value Ref Range   WBC 17.7 (H) 4.0 - 10.5 K/uL   RBC 5.46 4.22 - 5.81 MIL/uL   Hemoglobin 15.7 13.0 - 17.0 g/dL   HCT 45.2 39.0 - 52.0 %   MCV 82.8 78.0 - 100.0 fL   MCH 28.8 26.0 - 34.0 pg   MCHC 34.7 30.0 - 36.0 g/dL   RDW 12.6 11.5 - 15.5 %   Platelets 215 150 - 400 K/uL  Urinalysis, Routine w reflex microscopic (not at [facility])     Status: Abnormal   Collection Time: 09/15/15 12:55 PM  Result Value Ref Range   Color, Urine BROWN (A) YELLOW    Comment: BIOCHEMICALS MAY BE AFFECTED BY COLOR   APPearance TURBID (A) CLEAR   Specific Gravity, Urine 1.028 1.005 - 1.030   pH 5.5 5.0 - 8.0   Glucose, UA NEGATIVE NEGATIVE mg/dL   Hgb urine dipstick LARGE (A) NEGATIVE   Bilirubin Urine LARGE (A) NEGATIVE   Ketones, ur 15 (A) NEGATIVE mg/dL   Protein, ur >300 (A) NEGATIVE mg/dL   Urobilinogen, UA 1.0 0.0 - 1.0 mg/dL   Nitrite POSITIVE (A) NEGATIVE   Leukocytes, UA SMALL (A) NEGATIVE  Urine microscopic-add on     Status: Abnormal   Collection Time: 09/15/15 12:55 PM  Result Value Ref Range   Squamous Epithelial / LPF FEW (A) RARE   WBC, UA 7-10 <3 WBC/hpf   RBC / HPF TOO NUMEROUS TO COUNT <3 RBC/hpf   Bacteria, UA FEW (A) RARE   Casts GRANULAR CAST (A) NEGATIVE   Urine-Other AMORPHOUS URATES/PHOSPHATES   CK     Status: None   Collection Time: 09/15/15  2:27 PM  Result Value Ref Range   Total CK 63 49 - 397 U/L  Urine culture     Status: None (Preliminary result)   Collection Time: 09/15/15  2:41 PM  Result Value Ref Range   Specimen Description URINE, RANDOM    Special Requests NONE     Culture NO GROWTH < 24 HOURS    Report Status PENDING   Comprehensive metabolic panel     Status: Abnormal   Collection Time: 09/16/15  5:01 AM  Result Value Ref Range   Sodium 139 135 - 145 mmol/L   Potassium 3.8 3.5 - 5.1 mmol/L   Chloride 106 101 - 111 mmol/L   CO2 27 22 - 32 mmol/L   Glucose, Bld 104 (H) 65 - 99 mg/dL   BUN 15 6 - 20 mg/dL   Creatinine, Ser 1.03 0.61 - 1.24 mg/dL   Calcium 8.2 (L) 8.9 - 10.3 mg/dL   Total Protein 6.2 (L) 6.5 - 8.1 g/dL   Albumin 2.2 (L) 3.5 - 5.0 g/dL   AST 20 15 - 41 U/L   ALT 21 17 - 63 U/L   Alkaline Phosphatase 53 38 - 126 U/L   Total Bilirubin 0.3 0.3 - 1.2 mg/dL   GFR calc non Af Amer >60 >60 mL/min   GFR calc Af Amer >60 >60 mL/min    Comment: (NOTE) The eGFR has been calculated using the CKD EPI equation. This calculation has not been validated in all clinical situations. eGFR's persistently <60 mL/min signify possible Chronic Kidney Disease.    Anion gap 6 5 - 15  CBC     Status: Abnormal   Collection Time: 09/16/15  5:01 AM  Result Value Ref Range   WBC 7.9 4.0 - 10.5 K/uL   RBC 4.16 (L) 4.22 - 5.81 MIL/uL   Hemoglobin 12.1 (L) 13.0 - 17.0 g/dL    Comment: DELTA CHECK NOTED REPEATED TO VERIFY RESULT CALLED TO, READ BACK BY AND VERIFIED WITH: [facility] B[place] AT 4315 09/16/15. K.PAXTON    HCT 34.7 (L) 39.0 - 52.0 %   MCV 83.4 78.0 - 100.0 fL   MCH 29.1 26.0 - 34.0 pg   MCHC 34.9 30.0 - 36.0 g/dL   RDW 13.0 11.5 - 15.5 %   Platelets 169 150 - 400 K/uL  Urinalysis, Routine w reflex microscopic (not at [facility])     Status: Abnormal   Collection Time: 09/16/15  5:38 AM  Result Value Ref Range   Color, Urine AMBER (A) YELLOW    Comment: BIOCHEMICALS MAY BE AFFECTED BY COLOR   APPearance TURBID (A) CLEAR   Specific Gravity, Urine 1.016 1.005 - 1.030   pH 5.0 5.0 - 8.0   Glucose, UA NEGATIVE NEGATIVE mg/dL   Hgb urine dipstick LARGE (A) NEGATIVE   Bilirubin Urine NEGATIVE NEGATIVE   Ketones, ur NEGATIVE NEGATIVE mg/dL    Protein, ur 30 (A) NEGATIVE mg/dL   Urobilinogen, UA 0.2 0.0 - 1.0 mg/dL   Nitrite NEGATIVE NEGATIVE   Leukocytes, UA TRACE (A) NEGATIVE  Urine microscopic-add on     Status: Abnormal   Collection Time: 09/16/15  5:38 AM  Result Value Ref Range   Squamous Epithelial / LPF RARE RARE   WBC, UA 0-2 <3 WBC/hpf   RBC / HPF 21-50 <3 RBC/hpf   Bacteria, UA MANY (A) RARE     ROS:  A comprehensive review of systems was negative except for: Constitutional: positive for fatigue Genitourinary: positive for Flank pain Musculoskeletal: positive for arthralgias  Physical Exam: Filed Vitals:   09/16/15 1000  BP: 119/66  Pulse: 73  Temp: 97.8 F (36.6 C)  Resp: 20     General: [place] male who is healthy-appearing. However, he is in bed not moving much complaining of pain HEENT: Pupils are equal round reactive to light, extra motions are intact Neck: There is no jugular venous distention Heart: Regular rate and rhythm Lungs: Mostly clear Abdomen: Soft, diffusely tender Extremities: No edema Skin: Warm  and dry Neuro: Alert and nonfocal  Assessment/Plan: 28 year old male from [place] with previous episode of gross hematuria and flank pain. He now presents with another episode of gross hematuria and flank pain 2 years later. 1.Renal- I agree with both Dr. Hassell Done and Dr. Joelyn Oms think that the most likely diagnosis is IgA nephropathy. I also agree that a renal biopsy is not absolutely necessary to diagnose at this time. He seems to be in a good prognostic category with nonnephrotic proteinuria, normal blood pressure and normal GFR.  I told the patient and his wife that is not unusual that he will have these "episodes"as the Ig nephropathy usually flares whenever something else going on in the body such as a viral illness. If that should occur should simply hydrate as much as possible but will lead to clearing of the urine and resolution of the flank pain. I did mention that it would be  beneficial to follow-up with the kidney doctors at least once a year to be monitored. The best case scenario would be if he can stay on the lisinopril at low-dose -5 mg at bedtime as well as Fish oil supplement that would give at least 1000 DHA and 2000 of EPA per day.   This does equal a lot of pills up to 10 per day. Therefore, it is likely he will not be able to be compliant with this and that should be OK, the wife just wanted to make sure everything was being done to protect him in the future. 2. Hypertension/volume  - blood pressure is perfect. It may be that he didn't like the way that he felt on blood pressure medicine. He was on 10 of lisinopril. We can start maybe just 5 mg at bedtime at discharge 3. ID- I do not feel he has a urinary source of infection. The urinary findings are mostly just due to his hematuria.  From my standpoint he could be discharged any time his pain is manageable. My medication recommendations are as above. I will make sure that he has follow-up with [place] and Dr. Joelyn Oms. Renal will sign off, call with questions.      Vineta Carone A 09/16/2015, 12:27 PM

## 2015-09-16 NOTE — Progress Notes (Signed)
[facility] PROGRESS NOTE  Gary Cox KGO:[id number] DOB: Dec 20, 1985 DOA: 09/15/2015 PCP: No PCP Per Patient  Assessment/Plan: IgA nephropathy; acute renal failure; Pain management for flank pain.  IV fluids. Start lovaza.  Decrease pain medication he is sleepy  No evidence of UTI.   Code Status: Full code.  Family Communication: care discussed with wife  Disposition Plan: Home at time of discharge./ discharge when pain controlled and renal function stable.    Consultants:  Nephrology  Procedures:  none  Antibiotics:  none  HPI/Subjective: He is sleepy just received pain medication. Wife at bedside, helping with translation. Pain better with pain medications.   Objective: Filed Vitals:   09/16/15 0440  BP: 102/54  Pulse: 66  Temp: 98.2 F (36.8 C)  Resp: 19    Intake/Output Summary (Last 24 hours) at 09/16/15 1000 Last data filed at 09/16/15 0654  Gross per 24 hour  Intake 2857.5 ml  Output    485 ml  Net 2372.5 ml   There were no vitals filed for this visit.  Exam:   General:  Sleepy, wake up to voice.   Cardiovascular: S 1, S 2 RRR  Respiratory: CTA  Abdomen: BS present, soft   Musculoskeletal: no edema   Data Reviewed: Basic Metabolic Panel:  Recent Labs Lab 09/15/15 1140 09/16/15 0501  NA 137 139  K 3.8 3.8  CL 100* 106  CO2 28 27  GLUCOSE 105* 104*  BUN 17 15  CREATININE 1.49* 1.03  CALCIUM 9.3 8.2*   Liver Function Tests:  Recent Labs Lab 09/15/15 1140 09/16/15 0501  AST 27 20  ALT 31 21  ALKPHOS 86 53  BILITOT 1.0 0.3  PROT 8.2* 6.2*  ALBUMIN 3.2* 2.2*    Recent Labs Lab 09/15/15 1140  LIPASE 24   No results for input(s): AMMONIA in the last 168 hours. CBC:  Recent Labs Lab 09/15/15 1140 09/16/15 0501  WBC 17.7* 7.9  HGB 15.7 12.1*  HCT 45.2 34.7*  MCV 82.8 83.4  PLT 215 169   Cardiac Enzymes:  Recent Labs Lab 09/15/15 1427  CKTOTAL 63   BNP (last 3 results) No results for input(s):  BNP in the last 8760 hours.  ProBNP (last 3 results) No results for input(s): PROBNP in the last 8760 hours.  CBG: No results for input(s): GLUCAP in the last 168 hours.  No results found for this or any previous visit (from the past 240 hour(s)).   Studies: Ct Abdomen Pelvis Wo Contrast  09/15/2015  CLINICAL DATA:  Abdominal, low back and bilateral leg pain. Nausea, vomiting and diarrhea. Symptoms for 4 days. Initial encounter. EXAM: CT ABDOMEN AND PELVIS WITHOUT CONTRAST TECHNIQUE: Multidetector CT imaging of the abdomen and pelvis was performed following the standard protocol without IV contrast. COMPARISON:  CT abdomen and pelvis 02/02/2013. FINDINGS: Mild dependent atelectasis is seen in the lung bases. No pleural or pericardial effusion. The kidneys appear normal bilaterally. No renal or ureteral stones identified. No urinary bladder stones are present. Prostate gland and seminal vesicles are unremarkable. The gallbladder, liver, spleen, adrenal glands, pancreas and biliary tree are unremarkable. The stomach, small and large bowel and appendix appear normal. No lymphadenopathy or fluid. No focal bony abnormality. IMPRESSION: Negative CT abdomen and pelvis. Electronically Signed   By: Drusilla Kanner M.D.   On: 09/15/2015 13:01    Scheduled Meds: . enoxaparin (LOVENOX) injection  40 mg Subcutaneous Q24H  . [START ON 09/17/2015] Influenza vac split quadrivalent PF  0.5 mL Intramuscular  Tomorrow-1000   Continuous Infusions: . sodium chloride 125 mL/hr at 09/16/15 5784    Principal Problem:   Pyelonephritis Active Problems:   UTI (urinary tract infection)   Acute renal failure (ARF) (HCC)   Chronic IgA nephropathy   Dehydration, moderate    Time spent: 35 minutes.     Hartley Barefoot A  [facility] Pager [phone]. If 7PM-7AM, please contact night-coverage at [email or web address], password [email or web address] 09/16/2015, 10:00 AM  LOS: 1 day

## 2015-09-17 DIAGNOSIS — E860 Dehydration: Secondary | ICD-10-CM

## 2015-09-17 LAB — BASIC METABOLIC PANEL
Anion gap: 8 (ref 5–15)
BUN: 9 mg/dL (ref 6–20)
CHLORIDE: 104 mmol/L (ref 101–111)
CO2: 27 mmol/L (ref 22–32)
CREATININE: 0.91 mg/dL (ref 0.61–1.24)
Calcium: 8.7 mg/dL — ABNORMAL LOW (ref 8.9–10.3)
GFR calc Af Amer: >60 mL/min (ref >60)
GFR calc non Af Amer: >60 mL/min (ref >60)
Glucose, Bld: 94 mg/dL (ref 65–99)
POTASSIUM: 3.8 mmol/L (ref 3.5–5.1)
Sodium: 139 mmol/L (ref 135–145)

## 2015-09-17 LAB — PROTEIN, URINE, 24 HOUR
COLLECTION INTERVAL-UPROT: 24 h
Protein, 24H Urine: 884 mg/d — ABNORMAL HIGH (ref 50–100)
Protein, Urine: 26 mg/dL
URINE TOTAL VOLUME-UPROT: 3400 mL

## 2015-09-17 MED ORDER — LISINOPRIL 5 MG PO TABS: 5.0000 mg | ORAL_TABLET | Freq: Every day | ORAL | Status: DC

## 2015-09-17 MED ORDER — OXYCODONE HCL 5 MG PO TABS: 5.0000 mg | ORAL_TABLET | ORAL | Status: DC | PRN

## 2015-09-17 MED ORDER — OMEGA-3-ACID ETHYL ESTERS 1 G PO CAPS: 2.0000 g | ORAL_CAPSULE | Freq: Two times a day (BID) | ORAL | Status: DC

## 2015-09-17 MED ORDER — POLYETHYLENE GLYCOL 3350 17 G PO PACK: 17.0000 g | PACK | Freq: Every day | ORAL | Status: DC

## 2015-09-17 NOTE — Progress Notes (Signed)
Discharge instructions and medications discussed with patient and wife.  Prescriptions given to patient.  All questions answered.  

## 2015-09-17 NOTE — Discharge Summary (Signed)
Physician Discharge Summary  Gary Cox XEN:[id number] DOB: August 16, 1986 DOA: 09/15/2015  PCP: No PCP Per Patient  Admit date: 09/15/2015 Discharge date: 09/17/2015  Time spent: 35 minutes  Recommendations for Outpatient Follow-up:  1. Needs B-met to follow renal function.  2. Further refill for lisinopril/     Discharge Diagnoses:    IgA Nephropathy flare.    Acute renal failure (ARF) (HCC)   Chronic IgA nephropathy   Dehydration, moderate   Discharge Condition: stable.   Diet recommendation: heart healthy  There were no vitals filed for this visit.  History of present illness:  This is a 29 year old [place] male who has a known history of IgA nephropathy who presented with back pain and myalgias. He was previously hospitalized in 2014 for UTI and exacerbation of nephropathy. Patient reports several days ago he began developing bilateral lower back pain that radiated to the abdomen. This was not associated with fevers or chills, nausea or vomiting. According to the wife, who is translating for the patient, he has not been consistent with following up with medical staff and does not have a primary care physician because they do not have insurance and the patient feels that the cost involved with regular physician follow-up outweighs the benefits provided. Patient does work a strenuous job Doctor, general practice and drinks no more than 2 bottles of water per day. Also of the past several days when his back was hurting he began taking Motrin to relieve the back pain. When discussing this with the wife it appears they were unaware that given his underlying nephropathy that this classification of drug should not be utilized for pain management. In addition, the patient was supposed to be taking lisinopril but hasn't taken this in over a year because he has not been consistent with follow-up and has not had a refill available.  In the ER the patient was afebrile, hemodynamically stable  and maintaining room air saturations of 96%. Laboratory data was unremarkable except for creatinine of 1.49, white count of 17,700, no differential was done. Urinalysis was markedly abnormal with a turbid appearance, few bacteria, large bilirubin, granular cast, brown color, large hemoglobin, 15 ketones, small leukocytes, positive nitrite, greater than 300 protein, urine specific gravity 1.028, few squamous epithelials, WBC 7-10. EDP did discuss with Dr. Moshe Cipro of nephrology who states that IgA nephropathy exacerbations alone do not cause back pain and given the appearance of urinalysis that patient likely has an associated UTI and that should be treated. Primary recommendation was for hydration.  Hospital Course:  IgA nephropathy; acute renal failure; Presents with flare of IgA nephropathy, hematuria and flank pain.  Improved with IV fluids and pain medications.  Pain medication management for flank pain.  IV fluids. Started  lovaza.  No evidence of UTI. Urine culture no growth to date.  Will provide prescription for lisinopril 5 mg daily as recommended by nephrology.  He will need b-met to follow renal function. Please follow results of 24 hour protein in urine.  Will provide oxycodone for pain management   Procedures:  none  Consultations: Nephrology   Discharge Exam: Filed Vitals:   09/17/15 0513  BP: 120/75  Pulse: 60  Temp: 97.7 F (36.5 C)  Resp: 17    General: Alert in no distress Cardiovascular: S 1, S 2 RRR Respiratory: CTA  Discharge Instructions   Discharge Instructions    Diet - low sodium heart healthy    Complete by:  As directed      Increase activity slowly  Complete by:  As directed           Current Discharge Medication List    START taking these medications   Details  oxyCODONE (OXY IR/ROXICODONE) 5 MG immediate release tablet Take 1 tablet (5 mg total) by mouth every 4 (four) hours as needed for severe pain. Qty: 30 tablet, Refills: 0       CONTINUE these medications which have CHANGED   Details  lisinopril (PRINIVIL,ZESTRIL) 5 MG tablet Take 1 tablet (5 mg total) by mouth daily. Qty: 30 tablet, Refills: 1    omega-3 acid ethyl esters (LOVAZA) 1 G capsule Take 2 capsules (2 g total) by mouth 2 (two) times daily. Qty: 60 capsule, Refills: 0      CONTINUE these medications which have NOT CHANGED   Details  docusate sodium 100 MG CAPS Take 100 mg by mouth 2 (two) times daily. Qty: 10 capsule, Refills: 0      STOP taking these medications     HYDROcodone-acetaminophen (NORCO) 10-325 MG per tablet        No Known Allergies    The results of significant diagnostics from this hospitalization (including imaging, microbiology, ancillary and laboratory) are listed below for reference.    Significant Diagnostic Studies: Ct Abdomen Pelvis Wo Contrast  09/15/2015  CLINICAL DATA:  Abdominal, low back and bilateral leg pain. Nausea, vomiting and diarrhea. Symptoms for 4 days. Initial encounter. EXAM: CT ABDOMEN AND PELVIS WITHOUT CONTRAST TECHNIQUE: Multidetector CT imaging of the abdomen and pelvis was performed following the standard protocol without IV contrast. COMPARISON:  CT abdomen and pelvis 02/02/2013. FINDINGS: Mild dependent atelectasis is seen in the lung bases. No pleural or pericardial effusion. The kidneys appear normal bilaterally. No renal or ureteral stones identified. No urinary bladder stones are present. Prostate gland and seminal vesicles are unremarkable. The gallbladder, liver, spleen, adrenal glands, pancreas and biliary tree are unremarkable. The stomach, small and large bowel and appendix appear normal. No lymphadenopathy or fluid. No focal bony abnormality. IMPRESSION: Negative CT abdomen and pelvis. Electronically Signed   By: Inge Rise M.D.   On: 09/15/2015 13:01    Microbiology: Recent Results (from the past 240 hour(s))  Urine culture     Status: None   Collection Time: 09/15/15  2:41 PM   Result Value Ref Range Status   Specimen Description URINE, RANDOM  Final   Special Requests NONE  Final   Culture NO GROWTH 1 DAY  Final   Report Status 09/16/2015 FINAL  Final     Labs: Basic Metabolic Panel:  Recent Labs Lab 09/15/15 1140 09/16/15 0501 09/17/15 0510  NA 137 139 139  K 3.8 3.8 3.8  CL 100* 106 104  CO2 _0 GLUCOSE 105* 104* 94  BUN _1 CREATININE 1.49* 1.03 0.91  CALCIUM 9.3 8.2* 8.7*   Liver Function Tests:  Recent Labs Lab 09/15/15 1140 09/16/15 0501  AST 27 20  ALT 31 21  ALKPHOS 86 53  BILITOT 1.0 0.3  PROT 8.2* 6.2*  ALBUMIN 3.2* 2.2*    Recent Labs Lab 09/15/15 1140  LIPASE 24   No results for input(s): AMMONIA in the last 168 hours. CBC:  Recent Labs Lab 09/15/15 1140 09/16/15 0501  WBC 17.7* 7.9  HGB 15.7 12.1*  HCT 45.2 34.7*  MCV 82.8 83.4  PLT 215 169   Cardiac Enzymes:  Recent Labs Lab 09/15/15 1427  CKTOTAL 63   BNP: BNP (last 3 results) No results  for input(s): BNP in the last 8760 hours.  ProBNP (last 3 results) No results for input(s): PROBNP in the last 8760 hours.  CBG: No results for input(s): GLUCAP in the last 168 hours.     SignedNiel Hummer A  [facility] 09/17/2015, 10:24 AM

## 2015-09-27 ENCOUNTER — Ambulatory Visit: Payer: Medicaid Other | Admitting: Family Medicine

## 2015-10-17 ENCOUNTER — Ambulatory Visit: Payer: Medicaid Other | Admitting: Family Medicine

## 2015-11-20 ENCOUNTER — Ambulatory Visit (INDEPENDENT_AMBULATORY_CARE_PROVIDER_SITE_OTHER): Payer: Medicaid Other | Admitting: Family Medicine

## 2015-11-20 ENCOUNTER — Encounter: Payer: Self-pay | Admitting: Family Medicine

## 2015-11-20 VITALS — BP 130/74 | HR 75 | Temp 97.6°F | Resp 14 | Ht 67.0 in | Wt 157.0 lb

## 2015-11-20 DIAGNOSIS — Z789 Other specified health status: Secondary | ICD-10-CM

## 2015-11-20 DIAGNOSIS — D649 Anemia, unspecified: Secondary | ICD-10-CM

## 2015-11-20 DIAGNOSIS — N079 Hereditary nephropathy, not elsewhere classified with unspecified morphologic lesions: Secondary | ICD-10-CM

## 2015-11-20 DIAGNOSIS — F17200 Nicotine dependence, unspecified, uncomplicated: Secondary | ICD-10-CM

## 2015-11-20 DIAGNOSIS — N028 Recurrent and persistent hematuria with other morphologic changes: Secondary | ICD-10-CM

## 2015-11-20 LAB — POCT URINALYSIS DIP (DEVICE)
BILIRUBIN URINE: NEGATIVE
Glucose, UA: NEGATIVE mg/dL
Ketones, ur: NEGATIVE mg/dL
Leukocytes, UA: NEGATIVE
NITRITE: NEGATIVE
PH: 7 (ref 5.0–8.0)
PROTEIN: 100 mg/dL — AB
Specific Gravity, Urine: 1.015 (ref 1.005–1.030)
UROBILINOGEN UA: 0.2 mg/dL (ref 0.0–1.0)

## 2015-11-20 LAB — CBC WITH DIFFERENTIAL/PLATELET
Basophils Absolute: 0.1 10*3/uL (ref 0.0–0.1)
Basophils Relative: 1 % (ref 0–1)
EOS ABS: 0.3 10*3/uL (ref 0.0–0.7)
EOS PCT: 3 % (ref 0–5)
HEMATOCRIT: 42.6 % (ref 39.0–52.0)
Hemoglobin: 14.8 g/dL (ref 13.0–17.0)
LYMPHS ABS: 3.3 10*3/uL (ref 0.7–4.0)
LYMPHS PCT: 33 % (ref 12–46)
MCH: 28.9 pg (ref 26.0–34.0)
MCHC: 34.7 g/dL (ref 30.0–36.0)
MCV: 83.2 fL (ref 78.0–100.0)
MONO ABS: 0.6 10*3/uL (ref 0.1–1.0)
MONOS PCT: 6 % (ref 3–12)
MPV: 10 fL (ref 8.6–12.4)
Neutro Abs: 5.6 10*3/uL (ref 1.7–7.7)
Neutrophils Relative %: 57 % (ref 43–77)
Platelets: 274 10*3/uL (ref 150–400)
RBC: 5.12 MIL/uL (ref 4.22–5.81)
RDW: 14.1 % (ref 11.5–15.5)
WBC: 9.9 10*3/uL (ref 4.0–10.5)

## 2015-11-20 LAB — COMPLETE METABOLIC PANEL WITH GFR
ALT: 23 U/L (ref 9–46)
AST: 20 U/L (ref 10–40)
Albumin: 3.6 g/dL (ref 3.6–5.1)
Alkaline Phosphatase: 62 U/L (ref 40–115)
BILIRUBIN TOTAL: 0.3 mg/dL (ref 0.2–1.2)
BUN: 12 mg/dL (ref 7–25)
CALCIUM: 9.3 mg/dL (ref 8.6–10.3)
CHLORIDE: 102 mmol/L (ref 98–110)
CO2: 30 mmol/L (ref 20–31)
CREATININE: 0.84 mg/dL (ref 0.60–1.35)
GFR, Est African American: >89 mL/min (ref >=60)
GFR, Est Non African American: >89 mL/min (ref >=60)
Glucose, Bld: 77 mg/dL (ref 65–99)
Potassium: 4.8 mmol/L (ref 3.5–5.3)
Sodium: 137 mmol/L (ref 135–146)
TOTAL PROTEIN: 6.7 g/dL (ref 6.1–8.1)

## 2015-11-20 MED ORDER — OMEGA-3-ACID ETHYL ESTERS 1 G PO CAPS: 2.0000 g | ORAL_CAPSULE | Freq: Two times a day (BID) | ORAL | Status: DC

## 2015-11-20 MED ORDER — NICOTINE 14 MG/24HR TD PT24: 14.0000 mg | MEDICATED_PATCH | Freq: Every day | TRANSDERMAL | Status: DC

## 2015-11-20 MED ORDER — LISINOPRIL 5 MG PO TABS: 5.0000 mg | ORAL_TABLET | Freq: Every day | ORAL | Status: DC

## 2015-11-20 NOTE — Patient Instructions (Addendum)
Return to office in 3 months for a routine maintenance examination Tobacco Use Disorder Tobacco use disorder (TUD) is a mental disorder. It is the long-term use of tobacco in spite of related health problems or difficulty with normal life activities. Tobacco is most commonly smoked as cigarettes and less commonly as cigars or pipes. Smokeless chewing tobacco and snuff are also popular. People with TUD get a feeling of extreme pleasure (euphoria) from using tobacco and have a desire to use it again and again. Repeated use of tobacco can cause problems. The addictive effects of tobacco are due mainly tothe ingredient nicotine. Nicotine also causes a rush of adrenaline (epinephrine) in the body. This leads to increased blood pressure, heart rate, and breathing rate. These changes may cause problems for people with high blood pressure, weak hearts, or lung disease. High doses of nicotine in children and pets can lead to seizures and death.  Tobacco contains a number of other unsafe chemicals. These chemicals are especially harmful when inhaled as smoke and can damage almost every organ in the body. Smokers live shorter lives than nonsmokers and are at risk of dying from a number of diseases and cancers. Tobacco smoke can also cause health problems for nonsmokers (due to inhaling secondhand smoke). Smoking is also a fire hazard.  TUD usually starts in the late teenage years and is most common in young adults between the ages of 24 and 25 years. People who start smoking earlier in life are more likely to continue smoking as adults. TUD is somewhat more common in men than women. People with TUD are at higher risk for using alcohol and other drugs of abuse. RISK FACTORS Risk factors for TUD include:   Having family members with the disorder.  Being around people who use tobacco.  Having an existing mental health issue such as schizophrenia, depression, bipolar disorder, ADHD, or posttraumatic stress disorder  (PTSD). SIGNS AND SYMPTOMS  People with tobacco use disorder have two or more of the following signs and symptoms within 12 months:   Use of more tobacco over a longer period than intended.   Not able to cut down or control tobacco use.   A lot of time spent obtaining or using tobacco.   Strong desire or urge to use tobacco (craving). Cravings may last for 6 months or longer after quitting.  Use of tobacco even when use leads to major problems at work, school, or home.   Use of tobacco even when use leads to relationship problems.   Giving up or cutting down on important life activities because of tobacco use.   Repeatedly using tobacco in situations where it puts you or others in physical danger, like smoking in bed.   Use of tobacco even when it is known that a physical or mental problem is likely related to tobacco use.   Physical problems are numerous and may include chronic bronchitis, emphysema, lung and other cancers, gum disease, high blood pressure, heart disease, and stroke.   Mental problems caused by tobacco may include difficulty sleeping and anxiety.  Need to use greater amounts of tobacco to get the same effect. This means you have developed a tolerance.   Withdrawal symptoms as a result of stopping or rapidly cutting back use. These symptoms may last a month or more after quitting and include the following:   Depressed, anxious, or irritable mood.   Difficulty concentrating.   Increased appetite.  Restlessness or trouble sleeping.   Use of tobacco to  avoid withdrawal symptoms. DIAGNOSIS  Tobacco use disorder is diagnosed by your health care provider. A diagnosis may be made by:  Your health care provider asking questions about your tobacco use and any problems it may be causing.  A physical exam.  Lab tests.  You may be referred to a mental health professional or addiction specialist. The severity of tobacco use disorder depends on the  number of signs and symptoms you have:   Mild--Two or three symptoms.  Moderate--Four or five symptoms.   Severe--Six or more symptoms.  TREATMENT  Many people with tobacco use disorder are unable to quit on their own and need help. Treatment options include the following:  Nicotine replacement therapy (NRT). NRT provides nicotine without the other harmful chemicals in tobacco. NRT gradually lowers the dosage of nicotine in the body and reduces withdrawal symptoms. NRT is available in over-the-counter forms (gum, lozenges, and skin patches) as well as prescription forms (mouth inhaler and nasal spray).  Medicines.This may include:  Antidepressant medicine that may reduce nicotine cravings.  A medicine that acts on nicotine receptors in the brain to reduce cravings and withdrawal symptoms. It may also block the effects of tobacco in people with TUD who relapse.  Counseling or talk therapy. A form of talk therapy called behavioral therapy is commonly used to treat people with TUD. Behavioral therapy looks at triggers for tobacco use, how to avoid them, and how to cope with cravings. It is most effective in person or by phone but is also available in self-help forms (books and Internet websites).  Support groups. These provide emotional support, advice, and guidance for quitting tobacco. The most effective treatment for TUD is usually a combination of medicine, talk therapy, and support groups. HOME CARE INSTRUCTIONS  Keep all follow-up visits as directed by your health care provider. This is important.  Take medicines only as directed by your health care provider.  Check with your health care provider before starting new prescription or over-the-counter medicines. SEEK MEDICAL CARE IF:  You are not able to take your medicines as prescribed.  Treatment is not helping your TUD and your symptoms get worse. SEEK IMMEDIATE MEDICAL CARE IF:  You have serious thoughts about hurting  yourself or others.  You have trouble breathing, chest pain, sudden weakness, or sudden numbness in part of your body.   This information is not intended to replace advice given to you by your health care provider. Make sure you discuss any questions you have with your health care provider.   Document Released: 06/26/2004 Document Revised: 11/11/2014 Document Reviewed: 12/17/2013 Elsevier Interactive Patient Education [employer]

## 2015-11-20 NOTE — Progress Notes (Signed)
Subjective:    Patient ID: Gary Cox, male    DOB: 1986-04-28, 28 y.o.   MRN: [id number]  HPI Gary Cox, a 30 year old male with a history of IgA nephropathy presents accompanied by wife and children  to establish care. He has not been able to continue following with a primary provider or nephrologist due to lack of payer source. Patient's wife is assisting with interpreting, he primarily speaks Arabic. Patient was admitted to hospital inpatient services in  November 2016  due to a IgA Nephropathy flare. He was discharged on Lisinopril and has been taking medication consistently. He currently denies fever, fatigue, headache, chest pain, nausea, vomiting, or diarrhea.   Past Medical History  Diagnosis Date  . UTI (urinary tract infection)    Social History   Social History  . Marital Status: Married    Spouse Name: N/A  . Number of Children: N/A  . Years of Education: N/A   Occupational History  . Not on file.   Social History Main Topics  . Smoking status: Current Every Day Smoker -- 1.00 packs/day for 5 years    Types: Cigarettes  . Smokeless tobacco: Never Used  . Alcohol Use: No  . Drug Use: No  . Sexual Activity: Yes   Other Topics Concern  . Not on file   Social History Narrative  No Known Allergies Immunization History  Administered Date(s) Administered  . Influenza,inj,Quad PF,36+ Mos 09/17/2015   Review of Systems  Constitutional: Negative for fever and fatigue.  HENT: Negative.   Eyes: Negative.   Respiratory: Negative.   Cardiovascular: Negative.   Gastrointestinal: Negative.   Genitourinary: Negative.   Musculoskeletal: Negative.   Skin: Negative.   Allergic/Immunologic: Negative.   Neurological: Negative.   Hematological: Negative.   Psychiatric/Behavioral: Negative.       Objective:   Physical Exam  Constitutional: He is oriented to person, place, and time. He appears well-developed and well-nourished.  HENT:  Head: Normocephalic  and atraumatic.  Right Ear: External ear normal.  Left Ear: External ear normal.  Mouth/Throat: Oropharynx is clear and moist.  Eyes: Conjunctivae and EOM are normal. Pupils are equal, round, and reactive to light.  Neck: Normal range of motion. Neck supple.  Cardiovascular: Normal rate, regular rhythm, normal heart sounds and intact distal pulses.   Pulmonary/Chest: Effort normal and breath sounds normal.  Abdominal: Soft. Bowel sounds are normal.  Musculoskeletal: Normal range of motion.  Neurological: He is alert and oriented to person, place, and time. He has normal reflexes.  Skin: Skin is warm and dry.  Psychiatric: He has a normal mood and affect. His behavior is normal. Judgment and thought content normal.   BP 130/74 mmHg  Pulse 75  Temp(Src) 97.6 F (36.4 C) (Oral)  Resp 14  Ht 5\' 7"  (1.702 m)  Wt 157 lb (71.215 kg)  BMI 24.58 kg/m2 Assessment & Plan:  1. Chronic IgA nephropathy Will continue Lisinopril as previously prescribed by nephrology. Patient will warrant referral due to history of chronic IgA nephropathy. Patient has assymptomatic urinalysis with proteinuria and hematuria. Previous GFR evaluated, greater than 90.    - lisinopril (PRINIVIL,ZESTRIL) 5 MG tablet; Take 1 tablet (5 mg total) by mouth daily.  Dispense: 30 tablet; Refill: 5 - omega-3 acid ethyl esters (LOVAZA) 1 g capsule; Take 2 capsules (2 g total) by mouth 2 (two) times daily.  Dispense: 60 capsule; Refill: 5 - POCT urinalysis dipstick - COMPLETE METABOLIC PANEL WITH GFR - Ambulatory referral  to Nephrology  2. Anemia, unspecified anemia type Reviewed previous CBC, hemoglobin is 12.1, will repeat today.  - CBC with Differential  3. Tobacco dependence Smoking cessation instruction/counseling given:  counseled patient on the dangers of tobacco use, advised patient to stop smoking, and reviewed strategies to maximize success - nicotine (NICODERM CQ) 14 mg/24hr patch; Place 1 patch (14 mg total) onto  the skin daily.  Dispense: 28 patch; Refill: 0   4. Language barrier to communication Patient will require Arabic interpreter to assist with communication    RTC: 3 months for f/u and routine maintenance  Lian Tanori M, FNP

## 2015-11-21 ENCOUNTER — Other Ambulatory Visit: Payer: Self-pay

## 2015-11-21 DIAGNOSIS — N028 Recurrent and persistent hematuria with other morphologic changes: Secondary | ICD-10-CM

## 2015-11-21 DIAGNOSIS — F17200 Nicotine dependence, unspecified, uncomplicated: Secondary | ICD-10-CM

## 2015-11-21 MED ORDER — LISINOPRIL 5 MG PO TABS: 5.0000 mg | ORAL_TABLET | Freq: Every day | ORAL | Status: DC

## 2015-11-21 MED ORDER — OMEGA-3-ACID ETHYL ESTERS 1 G PO CAPS: 2.0000 g | ORAL_CAPSULE | Freq: Two times a day (BID) | ORAL | Status: DC

## 2015-11-21 NOTE — Telephone Encounter (Signed)
Called and advised patient's wife of lab results and that he was being referred to Nephrology. She states understanding and only ask [facility]. Thanks!

## 2015-11-23 DIAGNOSIS — F17200 Nicotine dependence, unspecified, uncomplicated: Secondary | ICD-10-CM | POA: Insufficient documentation

## 2015-11-23 DIAGNOSIS — Z789 Other specified health status: Secondary | ICD-10-CM | POA: Insufficient documentation

## 2015-12-07 MED FILL — OMEGA-3 ETHYL ESTERS 1 GM C: 1 | 30 days supply | Qty: 120 | Fill #0

## 2015-12-07 MED FILL — LISINOPRIL 5 MG TABLET: 5 | 30 days supply | Qty: 30 | Fill #0

## 2015-12-20 MED FILL — LISINOPRIL 20 MG TABLET: 20 | 30 days supply | Qty: 30 | Fill #0

## 2016-02-05 MED FILL — ?LISINOPRIL 20 MG TABLET: 20 | 30 days supply | Qty: 30 | Fill #1

## 2016-02-19 MED FILL — OMEGA-3 ETHYL ESTERS 1 GM C: 1 | 30 days supply | Qty: 120 | Fill #1

## 2016-02-20 ENCOUNTER — Encounter: Payer: Self-pay | Admitting: Family Medicine

## 2016-02-20 ENCOUNTER — Ambulatory Visit (INDEPENDENT_AMBULATORY_CARE_PROVIDER_SITE_OTHER): Payer: Self-pay | Admitting: Family Medicine

## 2016-02-20 VITALS — BP 116/61 | HR 71 | Temp 97.8°F | Ht 67.0 in | Wt 158.0 lb

## 2016-02-20 DIAGNOSIS — N028 Recurrent and persistent hematuria with other morphologic changes: Secondary | ICD-10-CM

## 2016-02-20 DIAGNOSIS — N079 Hereditary nephropathy, not elsewhere classified with unspecified morphologic lesions: Secondary | ICD-10-CM

## 2016-02-20 DIAGNOSIS — Z789 Other specified health status: Secondary | ICD-10-CM

## 2016-02-20 LAB — POCT URINALYSIS DIP (DEVICE)
BILIRUBIN URINE: NEGATIVE
GLUCOSE, UA: NEGATIVE mg/dL
KETONES UR: NEGATIVE mg/dL
Leukocytes, UA: NEGATIVE
NITRITE: NEGATIVE
PH: 5 (ref 5.0–8.0)
Specific Gravity, Urine: >=1.03 (ref 1.005–1.030)
Urobilinogen, UA: 0.2 mg/dL (ref 0.0–1.0)

## 2016-02-20 LAB — POCT URINALYSIS DIPSTICK
Bilirubin, UA: NEGATIVE
Glucose, UA: NEGATIVE
KETONES UA: NEGATIVE
LEUKOCYTES UA: NEGATIVE
Nitrite, UA: NEGATIVE
PH UA: 5
PROTEIN UA: POSITIVE
UROBILINOGEN UA: NEGATIVE

## 2016-02-20 MED ORDER — OMEGA-3-ACID ETHYL ESTERS 1 G PO CAPS: 2.0000 g | ORAL_CAPSULE | Freq: Two times a day (BID) | ORAL | Status: DC

## 2016-02-20 MED ORDER — LISINOPRIL 20 MG PO TABS: 20.0000 mg | ORAL_TABLET | Freq: Every day | ORAL | Status: DC

## 2016-02-20 NOTE — Patient Instructions (Signed)
Will follow up with fasting cholesterol panel on 02/23/2016 Will continue Lisinopril 20 mg daily

## 2016-02-20 NOTE — Progress Notes (Signed)
Subjective:    Patient ID: Gary Cox, male    DOB: 09-13-1986, 30 y.o.   MRN: [id number]  HPI Gary Cox, a 30 year old male with a history of IgA nephropathy presents for a 1 month follow-up. He states that he was evaluated by nephrologist at [facility] for IgA nephropathy. Patient states that his follow up in in 1 year.  Patient's wife is assisting with interpreting, he primarily speaks Arabic. Patient's lisinopril was increased to 20 mg and he has been taking medications consistently.  He currently denies fever, fatigue, headache, chest pain, nausea, vomiting, or diarrhea. He reports that he is exercising consistently and has quit smoking cigarettes.   Past Medical History  Diagnosis Date  . UTI (urinary tract infection)    Social History   Social History  . Marital Status: Married    Spouse Name: N/A  . Number of Children: N/A  . Years of Education: N/A   Occupational History  . Not on file.   Social History Main Topics  . Smoking status: Former Smoker -- 1.00 packs/day for 5 years    Types: Cigarettes  . Smokeless tobacco: Never Used  . Alcohol Use: No  . Drug Use: No  . Sexual Activity: Yes   Other Topics Concern  . Not on file   Social History Narrative  No Known Allergies Immunization History  Administered Date(s) Administered  . Influenza,inj,Quad PF,36+ Mos 09/17/2015   Review of Systems  Constitutional: Negative for fever and fatigue.  HENT: Negative.   Eyes: Negative.  Negative for photophobia and visual disturbance.  Respiratory: Negative.   Cardiovascular: Negative.  Negative for chest pain, palpitations and leg swelling.  Gastrointestinal: Negative.   Endocrine: Negative.  Negative for polydipsia, polyphagia and polyuria.  Genitourinary: Negative.   Musculoskeletal: Negative.   Skin: Negative.   Allergic/Immunologic: Negative.   Neurological: Negative.   Hematological: Negative.   Psychiatric/Behavioral: Negative.        Objective:   Physical Exam  Constitutional: He is oriented to person, place, and time. He appears well-developed and well-nourished.  HENT:  Head: Normocephalic and atraumatic.  Right Ear: External ear normal.  Left Ear: External ear normal.  Mouth/Throat: Oropharynx is clear and moist.  Eyes: Conjunctivae and EOM are normal. Pupils are equal, round, and reactive to light.  Neck: Normal range of motion. Neck supple.  Cardiovascular: Normal rate, regular rhythm, normal heart sounds and intact distal pulses.   Pulmonary/Chest: Effort normal and breath sounds normal.  Abdominal: Soft. Bowel sounds are normal.  Musculoskeletal: Normal range of motion.  Neurological: He is alert and oriented to person, place, and time. He has normal reflexes.  Skin: Skin is warm and dry.  Psychiatric: He has a normal mood and affect. His behavior is normal. Judgment and thought content normal.   BP 116/61 mmHg  Pulse 71  Temp(Src) 97.8 F (36.6 C) (Oral)  Ht 5\' 7"  (1.702 m)  Wt 158 lb (71.668 kg)  BMI 24.74 kg/m2  SpO2 97% Assessment & Plan:  1. Chronic IgA nephropathy Will continue Lisinopril as previously prescribed by nephrology. Patient to follow up with  history of chronic IgA nephropathy. Patient has assymptomatic urinalysis with proteinuria and hematuria. Previous GFR evaluated, greater than 90.  - lisinopril (PRINIVIL,ZESTRIL) 20 MG tablet; Take 1 tablet (20 mg total) by mouth daily.  Dispense: 30 tablet; Refill: 5 - omega-3 acid ethyl esters (LOVAZA) 1 g capsule; Take 2 capsules (2 g total) by mouth 2 (two) times daily.  Dispense: 60 capsule; Refill: 5 - BASIC METABOLIC PANEL WITH GFR - POCT urinalysis dipstick  2. Language barrier to communication Used wife to insist with interpreting, patient primarily speaks Arabic.     RTC: 3 months for f/u and routine maintenance  Izack Hoogland M, FNP

## 2016-02-21 ENCOUNTER — Telehealth: Payer: Self-pay

## 2016-02-21 LAB — BASIC METABOLIC PANEL WITH GFR
BUN: 15 mg/dL (ref 7–25)
CHLORIDE: 103 mmol/L (ref 98–110)
CO2: 26 mmol/L (ref 20–31)
Calcium: 9.6 mg/dL (ref 8.6–10.3)
Creat: 0.95 mg/dL (ref 0.60–1.35)
GFR, Est African American: >89 mL/min (ref >=60)
GFR, Est Non African American: >89 mL/min (ref >=60)
Glucose, Bld: 107 mg/dL — ABNORMAL HIGH (ref 65–99)
POTASSIUM: 4.2 mmol/L (ref 3.5–5.3)
Sodium: 143 mmol/L (ref 135–146)

## 2016-02-21 NOTE — Telephone Encounter (Signed)
-----   Message from Massie Maroon, [place] sent at 02/21/2016  8:15 AM EDT ----- Regarding: lab results  Please inform patient that labs are within a normal range.Please follow up in office as scheduled.   Thanks ----- Message -----    From: Judd Gaudier, CMA    Sent: 02/20/2016   3:52 PM      To: Massie Maroon, FNP

## 2016-02-21 NOTE — Telephone Encounter (Signed)
Spoke to mother. Gave lab results. She verbalized understanding.

## 2016-02-23 ENCOUNTER — Other Ambulatory Visit: Payer: Self-pay

## 2016-02-23 ENCOUNTER — Other Ambulatory Visit: Payer: Self-pay | Admitting: Family Medicine

## 2016-02-23 DIAGNOSIS — N028 Recurrent and persistent hematuria with other morphologic changes: Secondary | ICD-10-CM

## 2016-02-23 LAB — LIPID PANEL
CHOL/HDL RATIO: 4.3 ratio (ref <=5.0)
CHOLESTEROL: 230 mg/dL — AB (ref 125–200)
HDL: 53 mg/dL (ref >=40)
LDL Cholesterol: 158 mg/dL — ABNORMAL HIGH (ref <130)
Triglycerides: 97 mg/dL (ref <150)
VLDL: 19 mg/dL (ref <30)

## 2016-03-21 MED FILL — OMEGA-3 ETHYL ESTERS 1 GM C: 1 | 30 days supply | Qty: 120 | Fill #2

## 2016-03-21 MED FILL — LISINOPRIL 20 MG TABLET: 20 | 30 days supply | Qty: 30 | Fill #2

## 2016-03-25 ENCOUNTER — Encounter (HOSPITAL_COMMUNITY): Payer: Self-pay | Admitting: Emergency Medicine

## 2016-03-25 DIAGNOSIS — Z79899 Other long term (current) drug therapy: Secondary | ICD-10-CM | POA: Insufficient documentation

## 2016-03-25 DIAGNOSIS — E860 Dehydration: Secondary | ICD-10-CM | POA: Insufficient documentation

## 2016-03-25 DIAGNOSIS — N179 Acute kidney failure, unspecified: Secondary | ICD-10-CM | POA: Insufficient documentation

## 2016-03-25 DIAGNOSIS — Z87891 Personal history of nicotine dependence: Secondary | ICD-10-CM | POA: Insufficient documentation

## 2016-03-25 DIAGNOSIS — Z87440 Personal history of urinary (tract) infections: Secondary | ICD-10-CM | POA: Insufficient documentation

## 2016-03-25 LAB — COMPREHENSIVE METABOLIC PANEL
ALBUMIN: 3.3 g/dL — AB (ref 3.5–5.0)
ALK PHOS: 56 U/L (ref 38–126)
ALT: 16 U/L — AB (ref 17–63)
AST: 20 U/L (ref 15–41)
Anion gap: 6 (ref 5–15)
BILIRUBIN TOTAL: 0.5 mg/dL (ref 0.3–1.2)
BUN: 13 mg/dL (ref 6–20)
CALCIUM: 9.5 mg/dL (ref 8.9–10.3)
CO2: 27 mmol/L (ref 22–32)
Chloride: 103 mmol/L (ref 101–111)
Creatinine, Ser: 1.25 mg/dL — ABNORMAL HIGH (ref 0.61–1.24)
GFR calc Af Amer: >60 mL/min (ref >60)
GFR calc non Af Amer: >60 mL/min (ref >60)
GLUCOSE: 93 mg/dL (ref 65–99)
Potassium: 4.1 mmol/L (ref 3.5–5.1)
SODIUM: 136 mmol/L (ref 135–145)
TOTAL PROTEIN: 6.9 g/dL (ref 6.5–8.1)

## 2016-03-25 LAB — URINE MICROSCOPIC-ADD ON

## 2016-03-25 LAB — CBC
HCT: 41.5 % (ref 39.0–52.0)
Hemoglobin: 14.6 g/dL (ref 13.0–17.0)
MCH: 28.3 pg (ref 26.0–34.0)
MCHC: 35.2 g/dL (ref 30.0–36.0)
MCV: 80.6 fL (ref 78.0–100.0)
Platelets: 254 10*3/uL (ref 150–400)
RBC: 5.15 MIL/uL (ref 4.22–5.81)
RDW: 13 % (ref 11.5–15.5)
WBC: 7.6 10*3/uL (ref 4.0–10.5)

## 2016-03-25 LAB — URINALYSIS, ROUTINE W REFLEX MICROSCOPIC
BILIRUBIN URINE: NEGATIVE
GLUCOSE, UA: NEGATIVE mg/dL
KETONES UR: 15 mg/dL — AB
Leukocytes, UA: NEGATIVE
Nitrite: NEGATIVE
PH: 6 (ref 5.0–8.0)
Protein, ur: >300 mg/dL — AB
Specific Gravity, Urine: 1.025 (ref 1.005–1.030)

## 2016-03-25 LAB — LIPASE, BLOOD: Lipase: 27 U/L (ref 11–51)

## 2016-03-25 MED ORDER — ONDANSETRON 4 MG PO TBDP
ORAL_TABLET | ORAL | Status: DC
Start: 2016-03-25 — End: 2016-03-26
  Filled 2016-03-25: qty 1

## 2016-03-25 MED ORDER — ONDANSETRON 4 MG PO TBDP
4.0000 mg | ORAL_TABLET | Freq: Once | ORAL | Status: AC | PRN
  Administered 2016-03-25: 4 mg via ORAL

## 2016-03-25 NOTE — ED Notes (Addendum)
Pt reports vomiting x 4 days. Pt has hx of renal diseas and was told o come here for lab work. Pt alert x4. NAD at this time. Pt reports urine is extremely dark.

## 2016-03-26 ENCOUNTER — Emergency Department (HOSPITAL_COMMUNITY)
Admission: EM | Admit: 2016-03-26 | Discharge: 2016-03-26 | Disposition: A | Discharge status: Home / Self Care | Payer: Self-pay | Attending: Emergency Medicine | Admitting: Emergency Medicine

## 2016-03-26 DIAGNOSIS — E860 Dehydration: Secondary | ICD-10-CM

## 2016-03-26 DIAGNOSIS — R197 Diarrhea, unspecified: Secondary | ICD-10-CM

## 2016-03-26 DIAGNOSIS — R112 Nausea with vomiting, unspecified: Secondary | ICD-10-CM

## 2016-03-26 DIAGNOSIS — N179 Acute kidney failure, unspecified: Secondary | ICD-10-CM

## 2016-03-26 HISTORY — DX: Disorder of kidney and ureter, unspecified: N28.9

## 2016-03-26 MED ORDER — ONDANSETRON 8 MG PO TBDP: 8.0000 mg | ORAL_TABLET | Freq: Three times a day (TID) | ORAL | Status: DC | PRN

## 2016-03-26 MED ORDER — ONDANSETRON 4 MG PO TBDP
4.0000 mg | ORAL_TABLET | Freq: Once | ORAL | Status: AC
  Administered 2016-03-26: 4 mg via ORAL
  Filled 2016-03-26: qty 1

## 2016-03-26 MED ORDER — SODIUM CHLORIDE 0.9 % IV BOLUS (SEPSIS)
1000.0000 mL | Freq: Once | INTRAVENOUS | Status: AC
  Administered 2016-03-26: 1000 mL via INTRAVENOUS

## 2016-03-26 NOTE — ED Provider Notes (Signed)
CSN: [id number]     Arrival date & time 03/25/16  1635 History  By signing my name below, I, Marisue Humble, attest that this documentation has been prepared under the direction and in the presence of Derwood Kaplan, MD . Electronically Signed: Marisue Humble, Scribe. 03/26/2016. 1:50 AM.   Chief Complaint  Patient presents with  . Emesis   The history is provided by the patient and the spouse. No language interpreter was used.   HPI Comments:  Gary Cox is a 30 y.o. male with PMHx of IgA nephropathy who presents to the Emergency Department complaining of persistent vomiting within minutes of eating or taking medicine for the past 4 days, 5-10 episodes in 24 hours. Wife saw red and pink colors in pt's vomit but was unsure if it was bloody. Pt reports associated "tea colored" urine, nausea onset 10 minutes after eating, and non-bloody watery diarrhea onset 4 days ago with 5-10 episodes in the past 24 hours. No alleviating factors noted or treatments attempted PTA. Wife called nephrologist Dr. Scotty Court who referred pt to the ER to check lab work. Pt was dx with nephropathy 3 years ago. Wife reports her daughter had a c.diff infection a few years ago. Denies current pain, recent travel, recent camping or hiking, sick contacts, eating suspicious food, recent antibiotic use, h/o c.diff iff in pt, FHx of inflammatory bowel disease, dysuria, or rash.  Past Medical History  Diagnosis Date  . UTI (urinary tract infection)   . Renal disorder    Past Surgical History  Procedure Laterality Date  . Cyst removal neck     Family History  Problem Relation Age of Onset  . Hypertension Mother   . Hypertension Father    Social History  Substance Use Topics  . Smoking status: Former Smoker -- 1.00 packs/day for 5 years    Types: Cigarettes  . Smokeless tobacco: Never Used  . Alcohol Use: No    Review of Systems  10 systems reviewed and all are negative for acute change except as noted in the  HPI.  Allergies  Review of patient's allergies indicates no known allergies.  Home Medications   Prior to Admission medications   Medication Sig Start Date End Date Taking? Authorizing Provider  lisinopril (PRINIVIL,ZESTRIL) 20 MG tablet Take 1 tablet (20 mg total) by mouth daily. 02/20/16  Yes Massie Maroon, FNP  omega-3 acid ethyl esters (LOVAZA) 1 g capsule Take 2 capsules (2 g total) by mouth 2 (two) times daily. 02/20/16  Yes Massie Maroon, FNP  ondansetron (ZOFRAN ODT) 8 MG disintegrating tablet Take 1 tablet (8 mg total) by mouth every 8 (eight) hours as needed for nausea. 03/26/16   Deija Buhrman, MD   BP 113/58 mmHg  Pulse 57  Temp(Src) 98.2 F (36.8 C) (Oral)  Resp 18  SpO2 98%   Physical Exam  Constitutional: He appears well-developed and well-nourished. No distress.  HENT:  Head: Normocephalic and atraumatic.  Mouth/Throat: Oropharynx is clear and moist and mucous membranes are normal. No oropharyngeal exudate.  Eyes: Conjunctivae and EOM are normal. Pupils are equal, round, and reactive to light. Right eye exhibits no discharge. Left eye exhibits no discharge. No scleral icterus.  Neck: Normal range of motion. Neck supple. No JVD present. No thyromegaly present.  Cardiovascular: Normal rate, regular rhythm, normal heart sounds and intact distal pulses.  Exam reveals no gallop and no friction rub.   No murmur heard. Cap refill less than 3 seconds   Pulmonary/Chest: Effort  normal and breath sounds normal. No respiratory distress. He has no wheezes. He has no rales.  Lungs clear to auscultation  Abdominal: Soft. Bowel sounds are normal. He exhibits no distension and no mass.  Musculoskeletal: Normal range of motion. He exhibits no edema or tenderness.  Lymphadenopathy:    He has no cervical adenopathy.  Neurological: He is alert. Coordination normal.  Skin: Skin is warm and dry. No rash noted. No erythema.  Psychiatric: He has a normal mood and affect. His behavior  is normal.  Nursing note and vitals reviewed.   ED Course  Procedures  DIAGNOSTIC STUDIES:  Oxygen Saturation is 99% on RA, normal by my interpretation.    COORDINATION OF CARE:  1:47 AM Recommended pt continue to see nephrologist regularly. Will administer nausea medication and PO challenge. Discussed treatment plan with pt at bedside and pt agreed to plan.  3:29 AM  No emesis in the ER s/p 1L of fluids and PO challenge.  Labs Review Labs Reviewed  COMPREHENSIVE METABOLIC PANEL - Abnormal; Notable for the following:    Creatinine, Ser 1.25 (*)    Albumin 3.3 (*)    ALT 16 (*)    All other components within normal limits  URINALYSIS, ROUTINE W REFLEX MICROSCOPIC (NOT AT [facility]) - Abnormal; Notable for the following:    Color, Urine AMBER (*)    APPearance CLOUDY (*)    Hgb urine dipstick LARGE (*)    Ketones, ur 15 (*)    Protein, ur >300 (*)    All other components within normal limits  URINE MICROSCOPIC-ADD ON - Abnormal; Notable for the following:    Squamous Epithelial / LPF 0-5 (*)    Bacteria, UA RARE (*)    Crystals CA OXALATE CRYSTALS (*)    All other components within normal limits  URINE CULTURE  LIPASE, BLOOD  CBC    Imaging Review No results found. I have personally reviewed and evaluated these images and lab results as part of my medical decision-making.   EKG Interpretation None      MDM   Final diagnoses:  AKI (acute kidney injury) (HCC)  Dehydration  Nausea vomiting and diarrhea    I personally performed the services described in this documentation, which was scribed in my presence. The recorded information has been reviewed and is accurate.  PT comes in with cc of n/v/diarrhea. He has hx of igA nephropathy and thus wants to ensure the kidney function is fine and to evaluate the cause for the AKI.  Labs show slight elevation in Cr. Pt has mlld ketones as well.  No signs of infection on exam and abd is non-tender.  Oral challenge done,  and patient did well. Strict ER return precautions have been discussed, and patient is agreeing with the plan and is comfortable with the workup done and the recommendations from the ER.   Derwood Kaplan, MD 03/26/16 [phone]

## 2016-03-26 NOTE — Discharge Instructions (Signed)
Please see the doctor in 3-5 days. Take the nausea medications and hydrate well.  Please return to the ER if your symptoms worsen; you have increased pain, fevers, chills, inability to keep any medications down, bloody stools. Otherwise see the outpatient doctor as requested.   Dehydration, Adult Dehydration is a condition in which you do not have enough fluid or water in your body. It happens when you take in less fluid than you lose. Vital organs such as the kidneys, brain, and heart cannot function without a proper amount of fluids. Any loss of fluids from the body can cause dehydration.  Dehydration can range from mild to severe. This condition should be treated right away to help prevent it from becoming severe. CAUSES  This condition may be caused by:  Vomiting.  Diarrhea.  Excessive sweating, such as when exercising in hot or humid weather.  Not drinking enough fluid during strenuous exercise or during an illness.  Excessive urine output.  Fever.  Certain medicines. RISK FACTORS This condition is more likely to develop in:  People who are taking certain medicines that cause the body to lose excess fluid (diuretics).   People who have a chronic illness, such as diabetes, that may increase urination.  Older adults.   People who live at high altitudes.   People who participate in endurance sports.  SYMPTOMS  Mild Dehydration  Thirst.  Dry lips.  Slightly dry mouth.  Dry, warm skin. Moderate Dehydration  Very dry mouth.   Muscle cramps.   Dark urine and decreased urine production.   Decreased tear production.   Headache.   Light-headedness, especially when you stand up from a sitting position.  Severe Dehydration  Changes in skin.   Cold and clammy skin.   Skin does not spring back quickly when lightly pinched and released.   Changes in body fluids.   Extreme thirst.   No tears.   Not able to sweat when body temperature is  high, such as in hot weather.   Minimal urine production.   Changes in vital signs.   Rapid, weak pulse (more than 100 beats per minute when you are sitting still).   Rapid breathing.   Low blood pressure.   Other changes.   Sunken eyes.   Cold hands and feet.   Confusion.  Lethargy and difficulty being awakened.  Fainting (syncope).   Short-term weight loss.   Unconsciousness. DIAGNOSIS  This condition may be diagnosed based on your symptoms. You may also have tests to determine how severe your dehydration is. These tests may include:   Urine tests.   Blood tests.  TREATMENT  Treatment for this condition depends on the severity. Mild or moderate dehydration can often be treated at home. Treatment should be started right away. Do not wait until dehydration becomes severe. Severe dehydration needs to be treated at the hospital. Treatment for Mild Dehydration  Drinking plenty of water to replace the fluid you have lost.   Replacing minerals in your blood (electrolytes) that you may have lost.  Treatment for Moderate Dehydration  Consuming oral rehydration solution (ORS). Treatment for Severe Dehydration  Receiving fluid through an IV tube.   Receiving electrolyte solution through a feeding tube that is passed through your nose and into your stomach (nasogastric tube or NG tube).  Correcting any abnormalities in electrolytes. HOME CARE INSTRUCTIONS   Drink enough fluid to keep your urine clear or pale yellow.   Drink water or fluid slowly by taking small sips.  You can also try sucking on ice cubes.  Have food or beverages that contain electrolytes. Examples include bananas and sports drinks.  Take over-the-counter and prescription medicines only as told by your health care provider.   Prepare ORS according to the manufacturer's instructions. Take sips of ORS every 5 minutes until your urine returns to normal.  If you have vomiting or  diarrhea, continue to try to drink water, ORS, or both.   If you have diarrhea, avoid:   Beverages that contain caffeine.   Fruit juice.   Milk.   Carbonated soft drinks.  Do not take salt tablets. This can lead to the condition of having too much sodium in your body (hypernatremia).  SEEK MEDICAL CARE IF:  You cannot eat or drink without vomiting.  You have had moderate diarrhea during a period of more than 24 hours.  You have a fever. SEEK IMMEDIATE MEDICAL CARE IF:   You have extreme thirst.  You have severe diarrhea.  You have not urinated in 6-8 hours, or you have urinated only a small amount of very dark urine.  You have shriveled skin.  You are dizzy, confused, or both.   This information is not intended to replace advice given to you by your health care provider. Make sure you discuss any questions you have with your health care provider.   Document Released: 10/21/2005 Document Revised: 07/12/2015 Document Reviewed: 03/08/2015 Elsevier Interactive Patient Education 2016 [employer]  Diarrhea Diarrhea is frequent loose and watery bowel movements. It can cause you to feel weak and dehydrated. Dehydration can cause you to become tired and thirsty, have a dry mouth, and have decreased urination that often is dark yellow. Diarrhea is a sign of another problem, most often an infection that will not last long. In most cases, diarrhea typically lasts 2-3 days. However, it can last longer if it is a sign of something more serious. It is important to treat your diarrhea as directed by your caregiver to lessen or prevent future episodes of diarrhea. CAUSES  Some common causes include:  Gastrointestinal infections caused by viruses, bacteria, or parasites.  Food poisoning or food allergies.  Certain medicines, such as antibiotics, chemotherapy, and laxatives.  Artificial sweeteners and fructose.  Digestive disorders. HOME CARE INSTRUCTIONS  Ensure adequate  fluid intake (hydration): Have 1 cup (8 oz) of fluid for each diarrhea episode. Avoid fluids that contain simple sugars or sports drinks, fruit juices, whole milk products, and sodas. Your urine should be clear or pale yellow if you are drinking enough fluids. Hydrate with an oral rehydration solution that you can purchase at pharmacies, retail stores, and online. You can prepare an oral rehydration solution at home by mixing the following ingredients together:   - tsp table salt.   tsp baking soda.   tsp salt substitute containing potassium chloride.  1  tablespoons sugar.  1 L (34 oz) of water.  Certain foods and beverages may increase the speed at which food moves through the gastrointestinal (GI) tract. These foods and beverages should be avoided and include:  Caffeinated and alcoholic beverages.  High-fiber foods, such as raw fruits and vegetables, nuts, seeds, and whole grain breads and cereals.  Foods and beverages sweetened with sugar alcohols, such as xylitol, sorbitol, and mannitol.  Some foods may be well tolerated and may help thicken stool including:  Starchy foods, such as rice, toast, pasta, low-sugar cereal, oatmeal, grits, baked potatoes, crackers, and bagels.  Bananas.  Applesauce.  Add probiotic-rich  foods to help increase healthy bacteria in the GI tract, such as yogurt and fermented milk products.  Wash your hands well after each diarrhea episode.  Only take over-the-counter or prescription medicines as directed by your caregiver.  Take a warm bath to relieve any burning or pain from frequent diarrhea episodes. SEEK IMMEDIATE MEDICAL CARE IF:   You are unable to keep fluids down.  You have persistent vomiting.  You have blood in your stool, or your stools are black and tarry.  You do not urinate in 6-8 hours, or there is only a small amount of very dark urine.  You have abdominal pain that increases or localizes.  You have weakness, dizziness,  confusion, or light-headedness.  You have a severe headache.  Your diarrhea gets worse or does not get better.  You have a fever or persistent symptoms for more than 2-3 days.  You have a fever and your symptoms suddenly get worse. MAKE SURE YOU:   Understand these instructions.  Will watch your condition.  Will get help right away if you are not doing well or get worse.   This information is not intended to replace advice given to you by your health care provider. Make sure you discuss any questions you have with your health care provider.   Document Released: 10/11/2002 Document Revised: 11/11/2014 Document Reviewed: 06/28/2012 Elsevier Interactive Patient Education 2016 Else[employer] Inc.  Nausea and Vomiting Nausea is a sick feeling that often comes before throwing up (vomiting). Vomiting is a reflex where stomach contents come out of your mouth. Vomiting can cause severe loss of body fluids (dehydration). Children and elderly adults can become dehydrated quickly, especially if they also have diarrhea. Nausea and vomiting are symptoms of a condition or disease. It is important to find the cause of your symptoms. CAUSES   Direct irritation of the stomach lining. This irritation can result from increased acid production (gastroesophageal reflux disease), infection, food poisoning, taking certain medicines (such as nonsteroidal anti-inflammatory drugs), alcohol use, or tobacco use.  Signals from the brain.These signals could be caused by a headache, heat exposure, an inner ear disturbance, increased pressure in the brain from injury, infection, a tumor, or a concussion, pain, emotional stimulus, or metabolic problems.  An obstruction in the gastrointestinal tract (bowel obstruction).  Illnesses such as diabetes, hepatitis, gallbladder problems, appendicitis, kidney problems, cancer, sepsis, atypical symptoms of a heart attack, or eating disorders.  Medical treatments such as  chemotherapy and radiation.  Receiving medicine that makes you sleep (general anesthetic) during surgery. DIAGNOSIS Your caregiver may ask for tests to be done if the problems do not improve after a few days. Tests may also be done if symptoms are severe or if the reason for the nausea and vomiting is not clear. Tests may include:  Urine tests.  Blood tests.  Stool tests.  Cultures (to look for evidence of infection).  X-rays or other imaging studies. Test results can help your caregiver make decisions about treatment or the need for additional tests. TREATMENT You need to stay well hydrated. Drink frequently but in small amounts.You may wish to drink water, sports drinks, clear broth, or eat frozen ice pops or gelatin dessert to help stay hydrated.When you eat, eating slowly may help prevent nausea.There are also some antinausea medicines that may help prevent nausea. HOME CARE INSTRUCTIONS   Take all medicine as directed by your caregiver.  If you do not have an appetite, do not force yourself to eat. However, you must  continue to drink fluids.  If you have an appetite, eat a normal diet unless your caregiver tells you differently.  Eat a variety of complex carbohydrates (rice, wheat, potatoes, bread), lean meats, yogurt, fruits, and vegetables.  Avoid high-fat foods because they are more difficult to digest.  Drink enough water and fluids to keep your urine clear or pale yellow.  If you are dehydrated, ask your caregiver for specific rehydration instructions. Signs of dehydration may include:  Severe thirst.  Dry lips and mouth.  Dizziness.  Dark urine.  Decreasing urine frequency and amount.  Confusion.  Rapid breathing or pulse. SEEK IMMEDIATE MEDICAL CARE IF:   You have blood or brown flecks (like coffee grounds) in your vomit.  You have black or bloody stools.  You have a severe headache or stiff neck.  You are confused.  You have severe abdominal  pain.  You have chest pain or trouble breathing.  You do not urinate at least once every 8 hours.  You develop cold or clammy skin.  You continue to vomit for longer than 24 to 48 hours.  You have a fever. MAKE SURE YOU:   Understand these instructions.  Will watch your condition.  Will get help right away if you are not doing well or get worse.   This information is not intended to replace advice given to you by your health care provider. Make sure you discuss any questions you have with your health care provider.   Document Released: 10/21/2005 Document Revised: 01/13/2012 Document Reviewed: 03/20/2011 Elsevier Interactive Patient Education [employer]

## 2016-03-27 LAB — URINE CULTURE: Culture: NO GROWTH

## 2016-05-30 MED FILL — OMEGA-3 ETHYL ESTERS 1 GM C: 1 | 15 days supply | Qty: 60 | Fill #0

## 2016-05-30 MED FILL — LISINOPRIL 40 MG TABLET: 40 | 30 days supply | Qty: 30 | Fill #0

## 2016-07-26 MED FILL — LISINOPRIL 40 MG TABLET: 40 | 30 days supply | Qty: 30 | Fill #1

## 2016-07-26 MED FILL — OMEGA-3 ETHYL ESTERS 1 GM C: 1 | 15 days supply | Qty: 60 | Fill #1

## 2016-08-21 ENCOUNTER — Ambulatory Visit (INDEPENDENT_AMBULATORY_CARE_PROVIDER_SITE_OTHER): Payer: Self-pay | Admitting: Family Medicine

## 2016-08-21 ENCOUNTER — Encounter: Payer: Self-pay | Admitting: Family Medicine

## 2016-08-21 VITALS — BP 128/73 | HR 65 | Temp 98.4°F | Resp 16 | Ht 67.0 in | Wt 160.0 lb

## 2016-08-21 DIAGNOSIS — N028 Recurrent and persistent hematuria with other morphologic changes: Secondary | ICD-10-CM

## 2016-08-21 DIAGNOSIS — Z23 Encounter for immunization: Secondary | ICD-10-CM

## 2016-08-21 DIAGNOSIS — N189 Chronic kidney disease, unspecified: Secondary | ICD-10-CM

## 2016-08-21 LAB — COMPLETE METABOLIC PANEL WITH GFR
ALT: 19 U/L (ref 9–46)
AST: 21 U/L (ref 10–40)
Albumin: 3.6 g/dL (ref 3.6–5.1)
Alkaline Phosphatase: 52 U/L (ref 40–115)
BILIRUBIN TOTAL: 0.4 mg/dL (ref 0.2–1.2)
BUN: 11 mg/dL (ref 7–25)
CHLORIDE: 105 mmol/L (ref 98–110)
CO2: 30 mmol/L (ref 20–31)
Calcium: 9.3 mg/dL (ref 8.6–10.3)
Creat: 0.9 mg/dL (ref 0.60–1.35)
GFR, Est African American: >89 mL/min (ref >=60)
GLUCOSE: 89 mg/dL (ref 65–99)
POTASSIUM: 4.4 mmol/L (ref 3.5–5.3)
SODIUM: 144 mmol/L (ref 135–146)
TOTAL PROTEIN: 6.9 g/dL (ref 6.1–8.1)

## 2016-08-21 LAB — CBC WITH DIFFERENTIAL/PLATELET
BASOS ABS: 53 {cells}/uL (ref 0–200)
Basophils Relative: 1 %
EOS PCT: 4 %
Eosinophils Absolute: 212 cells/uL (ref 15–500)
HCT: 43.3 % (ref 38.5–50.0)
Hemoglobin: 14.9 g/dL (ref 13.2–17.1)
Lymphocytes Relative: 42 %
Lymphs Abs: 2226 cells/uL (ref 850–3900)
MCH: 28.8 pg (ref 27.0–33.0)
MCHC: 34.4 g/dL (ref 32.0–36.0)
MCV: 83.6 fL (ref 80.0–100.0)
MONOS PCT: 6 %
MPV: 9.3 fL (ref 7.5–12.5)
Monocytes Absolute: 318 cells/uL (ref 200–950)
NEUTROS ABS: 2491 {cells}/uL (ref 1500–7800)
Neutrophils Relative %: 47 %
PLATELETS: 243 10*3/uL (ref 140–400)
RBC: 5.18 MIL/uL (ref 4.20–5.80)
RDW: 13.4 % (ref 11.0–15.0)
WBC: 5.3 10*3/uL (ref 3.8–10.8)

## 2016-08-21 LAB — LIPID PANEL
CHOL/HDL RATIO: 4.6 ratio (ref <=5.0)
Cholesterol: 245 mg/dL — ABNORMAL HIGH (ref 125–200)
HDL: 53 mg/dL (ref >=40)
LDL CALC: 167 mg/dL — AB (ref <130)
Triglycerides: 124 mg/dL (ref <150)
VLDL: 25 mg/dL (ref <30)

## 2016-08-21 MED ORDER — LISINOPRIL 20 MG PO TABS: 20.0000 mg | ORAL_TABLET | Freq: Every day | ORAL | 1 refills | Status: DC

## 2016-08-21 MED ORDER — OMEGA-3-ACID ETHYL ESTERS 1 G PO CAPS: 2.0000 g | ORAL_CAPSULE | Freq: Two times a day (BID) | ORAL | 5 refills | Status: DC

## 2016-08-21 NOTE — Progress Notes (Signed)
Gary Cox, is a 30 y.o. male  ZOX:[id number]:[id number]  WJX:[id number]  DOB - January 24, 1986  CC:  Chief Complaint  Patient presents with  . Follow-up       HPI: Gary Cox is a 30 y.o. male presents today for routine follow-up. He has a chronic renal disorder and is followed yearly by nephrology. He is on lisinopril for renal protection per Mrs. Hollis and needs a refill on that. He also needs a refill on [place]. He reports feeling well without specific complaints. He reports a regular diet and no exercise other than what he gets at work. Information is obtain with help of wife.   Health Maintenance: He will receive influenza and Tdap today.  No Known Allergies Past Medical History:  Diagnosis Date  . Renal disorder   . UTI (urinary tract infection)    Current Outpatient Prescriptions on File Prior to Visit  Medication Sig Dispense Refill  . ondansetron (ZOFRAN ODT) 8 MG disintegrating tablet Take 1 tablet (8 mg total) by mouth every 8 (eight) hours as needed for nausea. (Patient not taking: Reported on 08/21/2016) 20 tablet 0   No current facility-administered medications on file prior to visit.    Family History  Problem Relation Age of Onset  . Hypertension Mother   . Hypertension Father    Social History   Social History  . Marital status: Married    Spouse name: N/A  . Number of children: N/A  . Years of education: N/A   Occupational History  . Not on file.   Social History Main Topics  . Smoking status: Former Smoker    Packs/day: 1.00    Years: 5.00    Types: Cigarettes  . Smokeless tobacco: Never Used  . Alcohol use No  . Drug use: No  . Sexual activity: Yes   Other Topics Concern  . Not on file   Social History Narrative  . No narrative on file    Review of Systems: Constitutional: Negative Skin: Negative HENT: Negative  Eyes: Negative  Neck: Negative Respiratory: Negative Cardiovascular: Negative Gastrointestinal: Negative Genitourinary:  Negative  Musculoskeletal: Negative   Neurological: Negative  Hematological: Negative  Psychiatric/Behavioral: Negative    Objective:   Vitals:   08/21/16 0842  BP: 128/73  Pulse: 65  Resp: 16  Temp: 98.4 F (36.9 C)    Physical Exam: Constitutional: Patient appears well-developed and well-nourished. No distress. HENT: Normocephalic, atraumatic, External right and left ear normal. Oropharynx is clear and moist.  Eyes: Conjunctivae and EOM are normal. PERRLA, no scleral icterus. Neck: Normal ROM. Neck supple. No lymphadenopathy, No thyromegaly. CVS: RRR, S1/S2 +, no murmurs, no gallops, no rubs Pulmonary: Effort and breath sounds normal, no stridor, rhonchi, wheezes, rales.  Abdominal: Soft. Normoactive BS,, no distension, tenderness, rebound or guarding.  Musculoskeletal: Normal range of motion. No edema and no tenderness.  Neuro: Alert.Normal muscle tone coordination. Non-focal Skin: Skin is warm and dry. No rash noted. Not diaphoretic. No erythema. No pallor. Psychiatric: Normal mood and affect. Behavior, judgment, thought content normal.  Lab Results  Component Value Date   WBC 7.6 03/25/2016   HGB 14.6 03/25/2016   HCT 41.5 03/25/2016   MCV 80.6 03/25/2016   PLT 254 03/25/2016   Lab Results  Component Value Date   CREATININE 1.25 (H) 03/25/2016   BUN 13 03/25/2016   NA 136 03/25/2016   K 4.1 03/25/2016   CL 103 03/25/2016   CO2 27 03/25/2016    No results found for:  HGBA1C Lipid Panel     Component Value Date/Time   CHOL 230 (H) 02/23/2016 0924   TRIG 97 02/23/2016 0924   HDL 53 02/23/2016 0924   CHOLHDL 4.3 02/23/2016 0924   VLDL 19 02/23/2016 0924   LDLCALC 158 (H) 02/23/2016 0924       Assessment and plan:   1. Chronic kidney disease, unspecified CKD stage  - CBC with Differential - COMPLETE METABOLIC PANEL WITH GFR - Lipid panel -follow-up with nephrology as planned.  2. Need for prophylactic vaccination and inoculation against  influenza  - Flu Vaccine QUAD 36+ mos PF IM (Fluarix & Fluzone Quad PF)  3. Need for Tdap vaccination   4. Chronic IgA nephropathy  - lisinopril (PRINIVIL,ZESTRIL) 20 MG tablet; Take 1 tablet (20 mg total) by mouth daily.  Dispense: 90 tablet; Refill: 1 - omega-3 acid ethyl esters (LOVAZA) 1 g capsule; Take 2 capsules (2 g total) by mouth 2 (two) times daily.  Dispense: 60 capsule; Refill: 5   No Follow-up on file.  The patient was given clear instructions to go to ER or return to medical center if symptoms don't improve, worsen or new problems develop. The patient verbalized understanding.    Henrietta Hoover FNP  08/21/2016, 10:08 AM

## 2016-08-23 ENCOUNTER — Telehealth: Payer: Self-pay

## 2016-08-23 NOTE — Telephone Encounter (Signed)
Called and spoke with patient, advised of labs. Thanks!

## 2016-11-27 ENCOUNTER — Encounter: Payer: Self-pay | Admitting: Family Medicine

## 2016-11-27 ENCOUNTER — Ambulatory Visit (INDEPENDENT_AMBULATORY_CARE_PROVIDER_SITE_OTHER): Payer: Self-pay | Admitting: Family Medicine

## 2016-11-27 VITALS — BP 119/62 | HR 67 | Temp 97.6°F | Resp 18 | Ht 67.0 in | Wt 161.4 lb

## 2016-11-27 DIAGNOSIS — R631 Polydipsia: Secondary | ICD-10-CM

## 2016-11-27 DIAGNOSIS — Z789 Other specified health status: Secondary | ICD-10-CM

## 2016-11-27 DIAGNOSIS — N028 Recurrent and persistent hematuria with other morphologic changes: Secondary | ICD-10-CM

## 2016-11-27 DIAGNOSIS — K219 Gastro-esophageal reflux disease without esophagitis: Secondary | ICD-10-CM

## 2016-11-27 DIAGNOSIS — R801 Persistent proteinuria, unspecified: Secondary | ICD-10-CM

## 2016-11-27 LAB — LIPID PANEL
Cholesterol: 258 mg/dL — ABNORMAL HIGH (ref <200)
HDL: 64 mg/dL (ref >40)
LDL CALC: 167 mg/dL — AB (ref <100)
Total CHOL/HDL Ratio: 4 Ratio (ref <5.0)
Triglycerides: 133 mg/dL (ref <150)
VLDL: 27 mg/dL (ref <30)

## 2016-11-27 LAB — COMPLETE METABOLIC PANEL WITH GFR
ALBUMIN: 3.7 g/dL (ref 3.6–5.1)
ALK PHOS: 56 U/L (ref 40–115)
ALT: 28 U/L (ref 9–46)
AST: 28 U/L (ref 10–40)
BUN: 14 mg/dL (ref 7–25)
CO2: 28 mmol/L (ref 20–31)
Calcium: 10.1 mg/dL (ref 8.6–10.3)
Chloride: 103 mmol/L (ref 98–110)
Creat: 0.9 mg/dL (ref 0.60–1.35)
GFR, Est African American: >89 mL/min (ref >=60)
GFR, Est Non African American: >89 mL/min (ref >=60)
GLUCOSE: 99 mg/dL (ref 65–99)
POTASSIUM: 4.7 mmol/L (ref 3.5–5.3)
SODIUM: 143 mmol/L (ref 135–146)
TOTAL PROTEIN: 7.2 g/dL (ref 6.1–8.1)
Total Bilirubin: 0.4 mg/dL (ref 0.2–1.2)

## 2016-11-27 LAB — POCT GLYCOSYLATED HEMOGLOBIN (HGB A1C): HEMOGLOBIN A1C: 5.4

## 2016-11-27 MED ORDER — LISINOPRIL 20 MG PO TABS: 20.0000 mg | ORAL_TABLET | Freq: Every day | ORAL | 1 refills | Status: DC

## 2016-11-27 MED ORDER — OMEGA-3-ACID ETHYL ESTERS 1 G PO CAPS
2.0000 g | ORAL_CAPSULE | Freq: Two times a day (BID) | ORAL | 5 refills | Status: DC
Start: 2016-11-27 — End: 2021-08-17

## 2016-11-27 MED ORDER — OMEPRAZOLE 20 MG PO CPDR: 20.0000 mg | DELAYED_RELEASE_CAPSULE | Freq: Every day | ORAL | 0 refills | Status: DC

## 2016-11-27 NOTE — Progress Notes (Signed)
Subjective:    Patient ID: Gary Cox, male    DOB: [birth year], 31 y.o.   MRN: [id number]  HPI Gary Cox, a 31 year old male with a history of IgA nephropathy presents for a follow up of chronic conditions. He states that he was evaluated by nephrologist at [facility] for IgA nephropathy several months ago. Patient states that he is followed yearly  Patient's wife is assisting with interpreting, he primarily speaks Arabic. Patient's lisinopril was increased to 20 mg and he has been taking medications consistently.  He currently denies fever, fatigue, headache, chest pain, nausea, vomiting, or diarrhea. He reports that he is exercising consistently and has quit smoking cigarettes. Paitent complains of heartburn. This has been associated with cough and need to clear throat frequently.  He denies belching, chest pain, choking on food, deep pressure at base of neck, difficulty swallowing, dysphagia and early satiety. Symptoms have been present for several months. He denies dysphagia.  He has not lost weight. He denies melena, hematochezia, hematemesis, and coffee ground emesis.  Past Medical History:  Diagnosis Date  . Renal disorder   . UTI (urinary tract infection)    Social History   Social History  . Marital status: Married    Spouse name: N/A  . Number of children: N/A  . Years of education: N/A   Occupational History  . Not on file.   Social History Main Topics  . Smoking status: Former Smoker    Packs/day: 1.00    Years: 5.00    Types: Cigarettes  . Smokeless tobacco: Never Used  . Alcohol use No  . Drug use: No  . Sexual activity: Yes   Other Topics Concern  . Not on file   Social History Narrative  . No narrative on file  No Known Allergies Immunization History  Administered Date(s) Administered  . Influenza,inj,Quad PF,36+ Mos 09/17/2015, 08/21/2016  . Tdap 08/21/2016   Review of Systems  Constitutional: Negative for fatigue and fever.  HENT:  Negative.   Eyes: Negative.  Negative for photophobia and visual disturbance.  Respiratory: Positive for cough (frequent throat clearing).   Cardiovascular: Negative.  Negative for chest pain, palpitations and leg swelling.  Gastrointestinal: Negative.        Heart burn   Endocrine: Negative.  Negative for polydipsia, polyphagia and polyuria.  Genitourinary: Negative.   Musculoskeletal: Negative.   Skin: Negative.   Allergic/Immunologic: Negative.   Neurological: Negative.   Hematological: Negative.   Psychiatric/Behavioral: Negative.       Objective:   Physical Exam  Constitutional: He is oriented to person, place, and time. He appears well-developed and well-nourished.  HENT:  Head: Normocephalic and atraumatic.  Right Ear: External ear normal.  Left Ear: External ear normal.  Mouth/Throat: Oropharynx is clear and moist.  Eyes: Conjunctivae and EOM are normal. Pupils are equal, round, and reactive to light.  Neck: Normal range of motion. Neck supple.  Cardiovascular: Normal rate, regular rhythm, normal heart sounds and intact distal pulses.   Pulmonary/Chest: Effort normal and breath sounds normal.  Abdominal: Soft. Bowel sounds are normal.  Musculoskeletal: Normal range of motion.  Neurological: He is alert and oriented to person, place, and time. He has normal reflexes.  Skin: Skin is warm and dry.  Psychiatric: He has a normal mood and affect. His behavior is normal. Judgment and thought content normal.   BP (!) 199/62 (BP Location: Right Arm, Patient Position: Sitting, Cuff Size: Normal)   Pulse 67  Temp 97.6 F (36.4 C) (Oral)   Resp 18   Ht 5\' 7"  (1.702 m)   Wt 161 lb 6.4 oz (73.2 kg)   SpO2 100%   BMI 25.28 kg/m  Assessment & Plan:  1. Chronic IgA nephropathy Will continue Lisinopril as previously prescribed by nephrology. Patient to follow up with  history of chronic IgA nephropathy. Patient has assymptomatic urinalysis with proteinuria and hematuria. Previous  GFR evaluated, greater than 90.  - COMPLETE METABOLIC PANEL WITH GFR - Lipid Panel - lisinopril (PRINIVIL,ZESTRIL) 20 MG tablet; Take 1 tablet (20 mg total) by mouth daily.  Dispense: 90 tablet; Refill: 1 - omega-3 acid ethyl esters (LOVAZA) 1 g capsule; Take 2 capsules (2 g total) by mouth 2 (two) times daily.  Dispense: 60 capsule; Refill: 5  2. Persistent proteinuria - lisinopril (PRINIVIL,ZESTRIL) 20 MG tablet; Take 1 tablet (20 mg total) by mouth daily.  Dispense: 90 tablet; Refill: 1  3. Gastroesophageal reflux disease without esophagitis - omeprazole (PRILOSEC) 20 MG capsule; Take 1 capsule (20 mg total) by mouth daily.  Dispense: 42 capsule; Refill: 0  4. Polydipsia - HgB A1c  5. Language barrier to communication Wife assisting with interpreting, patient primarily speaks Arabic    RTC: 3 months for chronic conditions  The patient was given clear instructions to go to ER or return to medical center if symptoms do not improve, worsen or new problems develop. The patient verbalized understanding. Will notify patient with laboratory results. Massie Maroon, FNP

## 2016-11-28 ENCOUNTER — Other Ambulatory Visit: Payer: Self-pay | Admitting: Family Medicine

## 2016-11-28 DIAGNOSIS — N028 Recurrent and persistent hematuria with other morphologic changes: Secondary | ICD-10-CM

## 2016-11-28 LAB — POCT URINALYSIS DIP (DEVICE)
Bilirubin Urine: NEGATIVE
Glucose, UA: NEGATIVE mg/dL
KETONES UR: NEGATIVE mg/dL
LEUKOCYTES UA: NEGATIVE
NITRITE: NEGATIVE
Protein, ur: >=300 mg/dL — AB
Specific Gravity, Urine: >=1.03 (ref 1.005–1.030)
Urobilinogen, UA: 0.2 mg/dL (ref 0.0–1.0)
pH: 6.5 (ref 5.0–8.0)

## 2017-02-25 ENCOUNTER — Ambulatory Visit: Payer: Self-pay | Admitting: Family Medicine

## 2017-03-24 MED FILL — LISINOPRIL 40 MG TABLET: 40 | 30 days supply | Qty: 30 | Fill #0

## 2017-06-19 MED FILL — OMEGA-3 ETHYL ESTERS 1 GM C: 1 | 15 days supply | Qty: 60 | Fill #0

## 2017-06-19 MED FILL — LISINOPRIL 40 MG TABLET: 40 | 30 days supply | Qty: 30 | Fill #1

## 2017-08-06 ENCOUNTER — Emergency Department (HOSPITAL_COMMUNITY): Payer: Self-pay

## 2017-08-06 ENCOUNTER — Emergency Department (HOSPITAL_COMMUNITY)
Admission: EM | Admit: 2017-08-06 | Discharge: 2017-08-06 | Disposition: A | Discharge status: Home / Self Care | Payer: Self-pay | Attending: Emergency Medicine | Admitting: Emergency Medicine

## 2017-08-06 ENCOUNTER — Encounter (HOSPITAL_COMMUNITY): Payer: Self-pay

## 2017-08-06 DIAGNOSIS — M545 Low back pain, unspecified: Secondary | ICD-10-CM

## 2017-08-06 DIAGNOSIS — Z79899 Other long term (current) drug therapy: Secondary | ICD-10-CM | POA: Insufficient documentation

## 2017-08-06 DIAGNOSIS — Z87440 Personal history of urinary (tract) infections: Secondary | ICD-10-CM | POA: Insufficient documentation

## 2017-08-06 DIAGNOSIS — Z87891 Personal history of nicotine dependence: Secondary | ICD-10-CM | POA: Insufficient documentation

## 2017-08-06 DIAGNOSIS — N028 Recurrent and persistent hematuria with other morphologic changes: Secondary | ICD-10-CM | POA: Insufficient documentation

## 2017-08-06 DIAGNOSIS — R319 Hematuria, unspecified: Secondary | ICD-10-CM | POA: Insufficient documentation

## 2017-08-06 LAB — CBC WITH DIFFERENTIAL/PLATELET
BASOS ABS: 0 10*3/uL (ref 0.0–0.1)
Basophils Relative: 1 %
EOS PCT: 3 %
Eosinophils Absolute: 0.2 10*3/uL (ref 0.0–0.7)
HEMATOCRIT: 42.5 % (ref 39.0–52.0)
Hemoglobin: 15 g/dL (ref 13.0–17.0)
LYMPHS ABS: 3.6 10*3/uL (ref 0.7–4.0)
LYMPHS PCT: 45 %
MCH: 29 pg (ref 26.0–34.0)
MCHC: 35.3 g/dL (ref 30.0–36.0)
MCV: 82.2 fL (ref 78.0–100.0)
MONO ABS: 0.5 10*3/uL (ref 0.1–1.0)
MONOS PCT: 6 %
NEUTROS ABS: 3.5 10*3/uL (ref 1.7–7.7)
Neutrophils Relative %: 45 %
PLATELETS: 249 10*3/uL (ref 150–400)
RBC: 5.17 MIL/uL (ref 4.22–5.81)
RDW: 12.8 % (ref 11.5–15.5)
WBC: 7.8 10*3/uL (ref 4.0–10.5)

## 2017-08-06 LAB — CK: CK TOTAL: 110 U/L (ref 49–397)

## 2017-08-06 LAB — COMPREHENSIVE METABOLIC PANEL
ALT: 39 U/L (ref 17–63)
AST: 36 U/L (ref 15–41)
Albumin: 3.2 g/dL — ABNORMAL LOW (ref 3.5–5.0)
Alkaline Phosphatase: 51 U/L (ref 38–126)
Anion gap: 7 (ref 5–15)
BILIRUBIN TOTAL: 0.4 mg/dL (ref 0.3–1.2)
BUN: 16 mg/dL (ref 6–20)
CHLORIDE: 107 mmol/L (ref 101–111)
CO2: 26 mmol/L (ref 22–32)
Calcium: 9.6 mg/dL (ref 8.9–10.3)
Creatinine, Ser: 1.11 mg/dL (ref 0.61–1.24)
Glucose, Bld: 96 mg/dL (ref 65–99)
POTASSIUM: 3.9 mmol/L (ref 3.5–5.1)
Sodium: 140 mmol/L (ref 135–145)
TOTAL PROTEIN: 6.4 g/dL — AB (ref 6.5–8.1)

## 2017-08-06 LAB — URINALYSIS, ROUTINE W REFLEX MICROSCOPIC
Bacteria, UA: NONE SEEN
Bilirubin Urine: NEGATIVE
GLUCOSE, UA: NEGATIVE mg/dL
Ketones, ur: NEGATIVE mg/dL
Leukocytes, UA: NEGATIVE
NITRITE: NEGATIVE
PH: 8 (ref 5.0–8.0)
SPECIFIC GRAVITY, URINE: 1.016 (ref 1.005–1.030)
Squamous Epithelial / LPF: NONE SEEN

## 2017-08-06 LAB — I-STAT CREATININE, ED: CREATININE: 1 mg/dL (ref 0.61–1.24)

## 2017-08-06 MED ORDER — KETOROLAC TROMETHAMINE 30 MG/ML IJ SOLN
15.0000 mg | Freq: Once | INTRAMUSCULAR | Status: AC
  Administered 2017-08-06: 15 mg via INTRAMUSCULAR
  Filled 2017-08-06: qty 1

## 2017-08-06 MED ORDER — FENTANYL CITRATE (PF) 100 MCG/2ML IJ SOLN
50.0000 ug | Freq: Once | INTRAMUSCULAR | Status: AC
  Administered 2017-08-06: 50 ug via INTRAVENOUS
  Filled 2017-08-06: qty 2

## 2017-08-06 MED ORDER — LIDOCAINE 5 % EX PTCH: 1.0000 | MEDICATED_PATCH | CUTANEOUS | 0 refills | Status: DC

## 2017-08-06 MED ORDER — CYCLOBENZAPRINE HCL 10 MG PO TABS: 10.0000 mg | ORAL_TABLET | Freq: Three times a day (TID) | ORAL | 0 refills | Status: DC | PRN

## 2017-08-06 MED ORDER — LIDOCAINE 5 % EX PTCH
1.0000 | MEDICATED_PATCH | CUTANEOUS | Status: DC
  Administered 2017-08-06: 1 via TRANSDERMAL
  Filled 2017-08-06: qty 1

## 2017-08-06 MED ORDER — IOPAMIDOL (ISOVUE-370) INJECTION 76%
INTRAVENOUS | Status: AC
  Filled 2017-08-06: qty 100

## 2017-08-06 MED ORDER — SODIUM CHLORIDE 0.9 % IV BOLUS (SEPSIS)
1000.0000 mL | Freq: Once | INTRAVENOUS | Status: AC
  Administered 2017-08-06: 1000 mL via INTRAVENOUS

## 2017-08-06 MED ORDER — DIAZEPAM 2 MG PO TABS: 2.0000 mg | ORAL_TABLET | Freq: Once | ORAL | Status: DC

## 2017-08-06 MED ORDER — SODIUM CHLORIDE 0.9 % IV BOLUS (SEPSIS): 1000.0000 mL | Freq: Once | INTRAVENOUS | Status: DC

## 2017-08-06 MED ORDER — HYDROCODONE-ACETAMINOPHEN 5-325 MG PO TABS
1.0000 | ORAL_TABLET | Freq: Once | ORAL | Status: AC
  Administered 2017-08-06: 1 via ORAL
  Filled 2017-08-06: qty 1

## 2017-08-06 MED ORDER — MORPHINE SULFATE (PF) 4 MG/ML IV SOLN
4.0000 mg | Freq: Once | INTRAVENOUS | Status: AC
Start: 2017-08-06 — End: 2017-08-06
  Administered 2017-08-06: 4 mg via INTRAVENOUS
  Filled 2017-08-06: qty 1

## 2017-08-06 MED ORDER — HYDROMORPHONE HCL 1 MG/ML IJ SOLN
1.0000 mg | Freq: Once | INTRAMUSCULAR | Status: AC
  Administered 2017-08-06: 1 mg via INTRAVENOUS
  Filled 2017-08-06: qty 1

## 2017-08-06 NOTE — ED Triage Notes (Signed)
patient complains of lower back pain, worse with movement for several days. Lifts furniture for work

## 2017-08-06 NOTE — Discharge Instructions (Signed)
Your work up has been reassuring. Unknown cause of your symptoms. Please use the lidocaine patch to your back. Perform stretches.  Please the the Flexeril for muscle relaxation. This medication will make you drowsy so avoid situation that could place you in danger.   Please follow up with her primary care doctor and your kidney doctor. Please return to th ED if your pain worseens.   Workup has been normal. Please take medications as prescribed and instructed.  SEEK IMMEDIATE MEDICAL ATTENTION IF: New numbness, tingling, weakness, or problem with the use of your arms or legs.  Severe back pain not relieved with medications.  Change in bowel or bladder control.  Urinary retention.  Numbness in your groin.  Increasing pain in any areas of the body (such as chest or abdominal pain).  Shortness of breath, dizziness or fainting.  Nausea (feeling sick to your stomach), vomiting, fever, or sweats.

## 2017-08-06 NOTE — ED Notes (Signed)
Pt reports pain radiates down L Leg when walking

## 2017-08-06 NOTE — ED Notes (Addendum)
Pt to wait in lobby for wife to pick him up. Pt verbalized understanding of discharge instructions. Pt ambulated with little to no difficulty.

## 2017-08-06 NOTE — ED Notes (Signed)
Patient transported to X-ray 

## 2017-08-06 NOTE — ED Notes (Signed)
Patient was given a Ginger Ale w/ Cup of Ice.

## 2017-08-06 NOTE — ED Notes (Signed)
Pt calling wife to come pick him up.

## 2017-08-06 NOTE — ED Provider Notes (Signed)
[facility]-EMERGENCY DEPT Provider Note   CSN: [id number] Arrival date & time: 08/06/17  1059     History   Chief Complaint Chief Complaint  Patient presents with  . Back Pain    HPI Gary Cox is a 31 y.o. male.  HPI 31 year old male past medical history significant for chronic IgA nephropathy that presents to the ED today with complaints of low back pain. Patient states that last week he went to the chiropractor for back pain. 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 He has been trying icing it at home with little relief. He has not taken any medications for his symptoms. Pt denies any ha, night sweats, hx of ivdu/cancer, loss or bowel or bladder, urinary retention, saddle paresthesias, lower extremity paresthesias. Patient denies any associated urinary symptoms, fever, nausea, emesis, abdominal pain. Pain is worse with movement and palpation.  Translator was used for evaluation.    Past Medical History:  Diagnosis Date  . Renal disorder   . UTI (urinary tract infection)     Patient Active Problem List   Diagnosis Date Noted  . Polydipsia 11/27/2016  . Tobacco dependence 11/23/2015  . Language barrier to communication 11/23/2015  . Absolute anemia 11/20/2015  . Pyelonephritis 09/15/2015  . Acute renal failure (ARF) (HCC) 09/15/2015  . Chronic IgA nephropathy 09/15/2015  . Dehydration, moderate 09/15/2015  . Hematuria 02/02/2013  . Flank pain 02/02/2013  . Acute pyelonephritis- bilateral 02/02/2013  . UTI (urinary tract infection) 02/02/2013    Past Surgical History:  Procedure Laterality Date  . CYST REMOVAL NECK         Home Medications    Prior to Admission medications   Medication Sig Start Date End Date Taking? Authorizing Provider  cyclobenzaprine (FLEXERIL) 10 MG tablet Take 1 tablet (10 mg total) by mouth 3 (three) times daily as needed for muscle  spasms. 08/06/17   Demetrios Loll T, PA-C  lidocaine (LIDODERM) 5 % Place 1 patch onto the skin daily. Remove & Discard patch within 12 hours or as directed by MD 08/06/17   Demetrios Loll T, PA-C  lisinopril (PRINIVIL,ZESTRIL) 20 MG tablet Take 1 tablet (20 mg total) by mouth daily. 11/27/16   Massie Maroon, FNP  omega-3 acid ethyl esters (LOVAZA) 1 g capsule Take 2 capsules (2 g total) by mouth 2 (two) times daily. 11/27/16   Massie Maroon, FNP  omeprazole (PRILOSEC) 20 MG capsule Take 1 capsule (20 mg total) by mouth daily. 11/27/16   Massie Maroon, FNP  ondansetron (ZOFRAN ODT) 8 MG disintegrating tablet Take 1 tablet (8 mg total) by mouth every 8 (eight) hours as needed for nausea. Patient not taking: Reported on 08/21/2016 03/26/16   Derwood Kaplan, MD    Family History Family History  Problem Relation Age of Onset  . Hypertension Mother   . Hypertension Father     Social History Social History  Substance Use Topics  . Smoking status: Former Smoker    Packs/day: 1.00    Years: 5.00    Types: Cigarettes  . Smokeless tobacco: Never Used  . Alcohol use No     Allergies   Patient has no known allergies.   Review of Systems Review of Systems  Constitutional: Negative for chills and fever.  Gastrointestinal: Negative for abdominal pain, diarrhea, nausea and vomiting.  Genitourinary: Negative for dysuria, frequency, hematuria and urgency.  Musculoskeletal: Positive for  back pain and myalgias. Negative for neck pain and neck stiffness.  Skin: Negative for color change and wound.  Neurological: Negative for weakness and numbness.     Physical Exam Updated Vital Signs BP 126/83   Pulse (!) 59   Temp 98.4 F (36.9 C) (Oral)   Resp 16   SpO2 94%   Physical Exam  Constitutional: He is oriented to person, place, and time. He appears well-developed and well-nourished. No distress.  HENT:  Head: Normocephalic and atraumatic.  Eyes: Right eye exhibits no  discharge. Left eye exhibits no discharge. No scleral icterus.  Neck: Normal range of motion. Neck supple.  No c spine midline tenderness. No paraspinal tenderness. No deformities or step offs noted. Full ROM. Supple. No nuchal rigidity.    Pulmonary/Chest: No respiratory distress.  Abdominal: Soft. Bowel sounds are normal. He exhibits no distension. There is no tenderness. There is no rigidity, no rebound, no guarding, no CVA tenderness, no tenderness at McBurney's point and negative Murphy's sign.  Musculoskeletal: Normal range of motion.  Patient with midline L-spine tenderness and paraspinal tenderness. No T-spine tenderness. No obvious deformities, step-offs noted. No obvious edema or change in skin color.  dP pulses are 2+ bilaterally. Sensation intact. Cap refill normal. Patient able to with normal gait.  Neurological: He is alert and oriented to person, place, and time.  Strength 5 out of 5 in lower extremities. Patellar reflexes are normal.  Skin: Skin is warm and dry. No pallor.  Psychiatric: His behavior is normal. Judgment and thought content normal.  Nursing note and vitals reviewed.    ED Treatments / Results  Labs (all labs ordered are listed, but only abnormal results are displayed) Labs Reviewed  URINALYSIS, ROUTINE W REFLEX MICROSCOPIC - Abnormal; Notable for the following:       Result Value   Hgb urine dipstick LARGE (*)    Protein, ur >=300 (*)    All other components within normal limits  COMPREHENSIVE METABOLIC PANEL - Abnormal; Notable for the following:    Total Protein 6.4 (*)    Albumin 3.2 (*)    All other components within normal limits  CBC WITH DIFFERENTIAL/PLATELET  CK  RAPID URINE DRUG SCREEN, HOSP PERFORMED  I-STAT CREATININE, ED    EKG  EKG Interpretation None       Radiology Dg Lumbar Spine Complete  Result Date: 08/06/2017 CLINICAL DATA:  Lumbago for several days EXAM: LUMBAR SPINE - COMPLETE 4+ VIEW COMPARISON:  None. FINDINGS:  Frontal, lateral, spot lumbosacral lateral, and bilateral oblique views were obtained. There are 5 non-rib-bearing lumbar type vertebral bodies. There is no fracture or spondylolisthesis. The disc spaces appear normal. There is no appreciable facet arthropathy. IMPRESSION: No fracture or spondylolisthesis.  No appreciable arthropathy. Electronically Signed   By: Bretta Bang III M.D.   On: 08/06/2017 13:24   Ct Renal Stone Study  Result Date: 08/06/2017 CLINICAL DATA:  31 year old male complaining of back pain that radiates into the left leg. EXAM: CT ABDOMEN AND PELVIS WITHOUT CONTRAST TECHNIQUE: Multidetector CT imaging of the abdomen and pelvis was performed following the standard protocol without IV contrast. COMPARISON:  CT the abdomen and pelvis 09/15/2015. FINDINGS: Lower chest: Unremarkable. Hepatobiliary: No definite cystic or solid hepatic lesions are identified on today's noncontrast CT examination. Unenhanced appearance of the gallbladder is normal. Pancreas: No definite pancreatic mass or peripancreatic inflammatory changes are noted on today's noncontrast CT examination. Spleen: Unremarkable. Adrenals/Urinary Tract: There are no abnormal calcifications within the collecting  system of either kidney, along the course of either ureter, or within the lumen of the urinary bladder. No hydroureteronephrosis or perinephric stranding to suggest urinary tract obstruction at this time. The unenhanced appearance of the kidneys is unremarkable bilaterally. Unenhanced appearance of the urinary bladder is normal. Bilateral adrenal glands are normal in appearance. Stomach/Bowel: Unenhanced appearance of the stomach is normal. There is no pathologic dilatation of small bowel or colon. Normal appendix. Vascular/Lymphatic: No atherosclerotic calcifications or definite aneurysm identified in the abdominal or pelvic vasculature. No lymphadenopathy noted in the abdomen or pelvis on today's noncontrast CT  examination. Reproductive: Prostate gland and seminal vesicles are grossly unremarkable in appearance. Other: No significant volume of ascites.  No pneumoperitoneum. Musculoskeletal: There are no aggressive appearing lytic or blastic lesions noted in the visualized portions of the skeleton. IMPRESSION: 1. No acute findings in the abdomen or pelvis to account for the patient's symptoms. Specifically, no urinary tract calculi no findings of urinary tract obstruction are noted at this time. 2. Normal appendix. Electronically Signed   By: Trudie Reed M.D.   On: 08/06/2017 15:40    Procedures Procedures (including critical care time)  Medications Ordered in ED Medications  iopamidol (ISOVUE-370) 76 % injection (not administered)  lidocaine (LIDODERM) 5 % 1 patch (not administered)  HYDROcodone-acetaminophen (NORCO/VICODIN) 5-325 MG per tablet 1 tablet (1 tablet Oral Given 08/06/17 1237)  ketorolac (TORADOL) 30 MG/ML injection 15 mg (15 mg Intramuscular Given 08/06/17 1327)  morphine 4 MG/ML injection 4 mg (4 mg Intravenous Given 08/06/17 1428)  HYDROmorphone (DILAUDID) injection 1 mg (1 mg Intravenous Given 08/06/17 1511)  sodium chloride 0.9 % bolus 1,000 mL (0 mLs Intravenous Stopped 08/06/17 1756)  fentaNYL (SUBLIMAZE) injection 50 mcg (50 mcg Intravenous Given 08/06/17 1632)     Initial Impression / Assessment and Plan / ED Course  I have reviewed the triage vital signs and the nursing notes.  Pertinent labs & imaging results that were available during my care of the patient were reviewed by me and considered in my medical decision making (see chart for details).     Patient presents to the ED with complaints of low back pain after going to the chiropractor last week. Patient with history of chronic IgA nephropathy. Followed by nephrology.Denies any urinary symptoms, fevers, chills, nausea, emesis, change in bowel habits. No red flag symptoms of the concern for cauda equina.  Patient is  neurovascularly intact. No focal neuro deficit noted on exam. Patient is able to ambulate.  Vital signs are reassuring. Patient does have midline L-spine tenderness and paraspinal tenderness. No obvious deformity noted.  Initially patient given a pain pill and x-ray was performed. X-ray shows no acute findings. The patient continued to complain of pain. Checked his creatinine which was normal. Also checked his urine. Toradol was given. Urine with protein and large amounts of RBCs. Concern for possible kidney stone. No signs of urinary tract infection. Doubt Pyelo.  CT renal study was performed that showed no acute findings.Patient continued to complain of pain. Patient was given IV pain medicine.  Given patient's history of IgA nephropathy but has required admission in the past. Did check a CK level and basic lab work.  Leukocytosis is noted. Kidney function is normal. Ck is normal. Doubt rhabdo.  Patient pain has improved with lidocaine patch and IV pain medication. He is ambulating with normal gait. Patient's pain is reproducible on palpation of his back. Feel the patient's pain likely due to musculoskeletal. Doubt any cauda equina.  Urine seems consistent with a baseline urine. He is followed at nephrology. Low suspicion for renal infarct at this time given that pain is reproducible and started after the chiropractor last week. Do not feel that pt iga neupropathy would cause sig pain.   Discussed findings with patient. Have instructed him to return to the ED if his pain does not subside with muscle relaxer and lidocaine patches at home. Needs close follow-up with his PCP and nephrologist.  Pt is hemodynamically stable, in NAD, & able to ambulate in the ED. Evaluation does not show pathology that would require ongoing emergent intervention or inpatient treatment. I explained the diagnosis to the patient. Pain has been managed & has no complaints prior to dc. Pt is comfortable with above plan and is  stable for discharge at this time. All questions were answered prior to disposition. Strict return precautions for f/u to the ED were discussed. Encouraged follow up with PCP.   p was dicussed and seen by Dr. Silverio Lay who is agreeable to the above plan.     Final Clinical Impressions(s) / ED Diagnoses   Final diagnoses:  Acute midline low back pain without sciatica    New Prescriptions New Prescriptions   CYCLOBENZAPRINE (FLEXERIL) 10 MG TABLET    Take 1 tablet (10 mg total) by mouth 3 (three) times daily as needed for muscle spasms.   LIDOCAINE (LIDODERM) 5 %    Place 1 patch onto the skin daily. Remove & Discard patch within 12 hours or as directed by MD     Rise Mu, PA-C 08/06/17 1809    Charlynne Pander, MD 08/07/17 1430

## 2017-08-25 ENCOUNTER — Emergency Department (HOSPITAL_COMMUNITY): Payer: Self-pay

## 2017-08-25 ENCOUNTER — Inpatient Hospital Stay (HOSPITAL_COMMUNITY)
Admission: EM | Admit: 2017-08-25 | Discharge: 2017-08-27 | DRG: 605 | Disposition: A | Discharge status: Home / Self Care | Payer: Self-pay | Attending: General Surgery | Admitting: General Surgery

## 2017-08-25 ENCOUNTER — Observation Stay (HOSPITAL_COMMUNITY): Payer: Self-pay

## 2017-08-25 DIAGNOSIS — S61012A Laceration without foreign body of left thumb without damage to nail, initial encounter: Secondary | ICD-10-CM | POA: Diagnosis present

## 2017-08-25 DIAGNOSIS — S1191XA Laceration without foreign body of unspecified part of neck, initial encounter: Secondary | ICD-10-CM | POA: Diagnosis present

## 2017-08-25 DIAGNOSIS — J939 Pneumothorax, unspecified: Secondary | ICD-10-CM

## 2017-08-25 DIAGNOSIS — W260XXA Contact with knife, initial encounter: Secondary | ICD-10-CM | POA: Diagnosis present

## 2017-08-25 DIAGNOSIS — T148XXA Other injury of unspecified body region, initial encounter: Secondary | ICD-10-CM

## 2017-08-25 DIAGNOSIS — Z23 Encounter for immunization: Secondary | ICD-10-CM

## 2017-08-25 DIAGNOSIS — T1490XA Injury, unspecified, initial encounter: Secondary | ICD-10-CM

## 2017-08-25 DIAGNOSIS — S1183XA Puncture wound without foreign body of other specified part of neck, initial encounter: Principal | ICD-10-CM | POA: Diagnosis present

## 2017-08-25 HISTORY — DX: Recurrent and persistent hematuria with other morphologic changes: N02.8

## 2017-08-25 LAB — ACETAMINOPHEN LEVEL: Acetaminophen (Tylenol), Serum: <10 ug/mL — ABNORMAL LOW (ref 10–30)

## 2017-08-25 LAB — BPAM FFP
BLOOD PRODUCT EXPIRATION DATE: 201810242359
Blood Product Expiration Date: 201811032359
Blood Product Expiration Date: 201811032359
ISSUE DATE / TIME: 201810211832
ISSUE DATE / TIME: 201810221600
ISSUE DATE / TIME: 201810221600
UNIT TYPE AND RH: 600
Unit Type and Rh: 600
Unit Type and Rh: 6200

## 2017-08-25 LAB — PREPARE FRESH FROZEN PLASMA
UNIT DIVISION: 0
UNIT DIVISION: 0
UNIT DIVISION: 0

## 2017-08-25 LAB — COMPREHENSIVE METABOLIC PANEL
ALBUMIN: 3.4 g/dL — AB (ref 3.5–5.0)
ALK PHOS: 54 U/L (ref 38–126)
ALT: 31 U/L (ref 17–63)
ANION GAP: 10 (ref 5–15)
AST: 35 U/L (ref 15–41)
BUN: 13 mg/dL (ref 6–20)
CALCIUM: 9 mg/dL (ref 8.9–10.3)
CO2: 24 mmol/L (ref 22–32)
Chloride: 105 mmol/L (ref 101–111)
Creatinine, Ser: 1.12 mg/dL (ref 0.61–1.24)
GFR calc Af Amer: >60 mL/min (ref >60)
GFR calc non Af Amer: >60 mL/min (ref >60)
GLUCOSE: 118 mg/dL — AB (ref 65–99)
Potassium: 3.7 mmol/L (ref 3.5–5.1)
SODIUM: 139 mmol/L (ref 135–145)
Total Bilirubin: 0.5 mg/dL (ref 0.3–1.2)
Total Protein: 6.6 g/dL (ref 6.5–8.1)

## 2017-08-25 LAB — CBC WITH DIFFERENTIAL/PLATELET
BASOS PCT: 0 %
Basophils Absolute: 0 10*3/uL (ref 0.0–0.1)
EOS ABS: 0.2 10*3/uL (ref 0.0–0.7)
Eosinophils Relative: 2 %
HCT: 43.1 % (ref 39.0–52.0)
HEMOGLOBIN: 15.3 g/dL (ref 13.0–17.0)
Lymphocytes Relative: 40 %
Lymphs Abs: 3.8 10*3/uL (ref 0.7–4.0)
MCH: 29.2 pg (ref 26.0–34.0)
MCHC: 35.5 g/dL (ref 30.0–36.0)
MCV: 82.3 fL (ref 78.0–100.0)
MONOS PCT: 5 %
Monocytes Absolute: 0.5 10*3/uL (ref 0.1–1.0)
NEUTROS PCT: 53 %
Neutro Abs: 5 10*3/uL (ref 1.7–7.7)
Platelets: 276 10*3/uL (ref 150–400)
RBC: 5.24 MIL/uL (ref 4.22–5.81)
RDW: 12.7 % (ref 11.5–15.5)
WBC: 9.5 10*3/uL (ref 4.0–10.5)

## 2017-08-25 LAB — I-STAT CHEM 8, ED
BUN: 15 mg/dL (ref 6–20)
CALCIUM ION: 1.09 mmol/L — AB (ref 1.15–1.40)
Chloride: 104 mmol/L (ref 101–111)
Creatinine, Ser: 1.1 mg/dL (ref 0.61–1.24)
Glucose, Bld: 119 mg/dL — ABNORMAL HIGH (ref 65–99)
HCT: 44 % (ref 39.0–52.0)
HEMOGLOBIN: 15 g/dL (ref 13.0–17.0)
Potassium: 3.6 mmol/L (ref 3.5–5.1)
SODIUM: 141 mmol/L (ref 135–145)
TCO2: 25 mmol/L (ref 22–32)

## 2017-08-25 LAB — ABO/RH: ABO/RH(D): A POS

## 2017-08-25 LAB — CDS SEROLOGY

## 2017-08-25 LAB — I-STAT CG4 LACTIC ACID, ED: LACTIC ACID, VENOUS: 2.58 mmol/L — AB (ref 0.5–1.9)

## 2017-08-25 LAB — PROTIME-INR
INR: 0.91
PROTHROMBIN TIME: 12.2 s (ref 11.4–15.2)

## 2017-08-25 LAB — SALICYLATE LEVEL

## 2017-08-25 LAB — ETHANOL: Alcohol, Ethyl (B): <10 mg/dL (ref <10)

## 2017-08-25 MED ORDER — IOPAMIDOL (ISOVUE-300) INJECTION 61%
INTRAVENOUS | Status: AC
  Filled 2017-08-25: qty 100

## 2017-08-25 MED ORDER — TETANUS-DIPHTH-ACELL PERTUSSIS 5-2.5-18.5 LF-MCG/0.5 IM SUSP
0.5000 mL | Freq: Once | INTRAMUSCULAR | Status: AC
  Administered 2017-08-25: 0.5 mL via INTRAMUSCULAR
  Filled 2017-08-25: qty 0.5

## 2017-08-25 MED ORDER — ONDANSETRON 4 MG PO TBDP
4.0000 mg | ORAL_TABLET | Freq: Four times a day (QID) | ORAL | Status: DC | PRN
Start: 2017-08-25 — End: 2017-08-27

## 2017-08-25 MED ORDER — TRAMADOL HCL 50 MG PO TABS
50.0000 mg | ORAL_TABLET | Freq: Four times a day (QID) | ORAL | Status: DC | PRN
  Administered 2017-08-25 – 2017-08-26 (×4): 50 mg via ORAL
  Filled 2017-08-25 (×4): qty 1

## 2017-08-25 MED ORDER — PROCHLORPERAZINE MALEATE 10 MG PO TABS
10.0000 mg | ORAL_TABLET | Freq: Four times a day (QID) | ORAL | Status: DC | PRN
  Filled 2017-08-25: qty 1

## 2017-08-25 MED ORDER — OXYCODONE HCL 5 MG PO TABS
5.0000 mg | ORAL_TABLET | ORAL | Status: DC | PRN
  Administered 2017-08-25 – 2017-08-27 (×7): 10 mg via ORAL
  Filled 2017-08-25 (×7): qty 2

## 2017-08-25 MED ORDER — PROCHLORPERAZINE EDISYLATE 5 MG/ML IJ SOLN: 5.0000 mg | Freq: Four times a day (QID) | INTRAMUSCULAR | Status: DC | PRN

## 2017-08-25 MED ORDER — MORPHINE SULFATE (PF) 4 MG/ML IV SOLN
2.0000 mg | Freq: Once | INTRAVENOUS | Status: AC
  Administered 2017-08-25: 2 mg via INTRAVENOUS
  Filled 2017-08-25: qty 1

## 2017-08-25 MED ORDER — FENTANYL CITRATE (PF) 100 MCG/2ML IJ SOLN
INTRAMUSCULAR | Status: AC
  Administered 2017-08-25: 50 ug
  Filled 2017-08-25: qty 2

## 2017-08-25 MED ORDER — ONDANSETRON HCL 4 MG/2ML IJ SOLN
INTRAMUSCULAR | Status: AC
  Filled 2017-08-25: qty 2

## 2017-08-25 MED ORDER — ONDANSETRON HCL 4 MG/2ML IJ SOLN
INTRAMUSCULAR | Status: AC
  Administered 2017-08-25: 4 mg
  Filled 2017-08-25: qty 2

## 2017-08-25 MED ORDER — ONDANSETRON HCL 4 MG/2ML IJ SOLN
4.0000 mg | Freq: Four times a day (QID) | INTRAMUSCULAR | Status: DC | PRN
Start: 2017-08-25 — End: 2017-08-27

## 2017-08-25 MED ORDER — MORPHINE SULFATE (PF) 4 MG/ML IV SOLN
INTRAVENOUS | Status: AC
  Administered 2017-08-25: 4 mg
  Filled 2017-08-25: qty 1

## 2017-08-25 MED ORDER — MORPHINE SULFATE (PF) 4 MG/ML IV SOLN
1.0000 mg | INTRAVENOUS | Status: DC | PRN
  Administered 2017-08-25 – 2017-08-27 (×8): 2 mg via INTRAVENOUS
  Filled 2017-08-25 (×8): qty 1

## 2017-08-25 MED ORDER — SODIUM CHLORIDE 0.9 % IV SOLN
INTRAVENOUS | Status: DC
  Administered 2017-08-25 – 2017-08-26 (×4): via INTRAVENOUS

## 2017-08-25 NOTE — ED Notes (Signed)
PT to xray at this time.
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
## 2017-08-25 NOTE — ED Notes (Signed)
Pt to CT at this time.

## 2017-08-25 NOTE — ED Notes (Signed)
Return from CT
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
## 2017-08-25 NOTE — ED Notes (Signed)
Pt's wife and [employer] at bedside

## 2017-08-25 NOTE — ED Notes (Signed)
Gary Spruce MD and Wife at bedside

## 2017-08-25 NOTE — ED Notes (Signed)
Pt returned from x-ray.  Wife at bedside.  Pt requesting more pain meds.
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
## 2017-08-25 NOTE — ED Notes (Signed)
Pt remains alert and oriented x's 4.  No resp. Distress present, Pt speaking in full sentences
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
## 2017-08-26 ENCOUNTER — Observation Stay (HOSPITAL_COMMUNITY): Payer: Self-pay

## 2017-08-26 ENCOUNTER — Encounter (HOSPITAL_COMMUNITY): Payer: Self-pay

## 2017-08-26 DIAGNOSIS — N028 Recurrent and persistent hematuria with other morphologic changes: Secondary | ICD-10-CM

## 2017-08-26 DIAGNOSIS — N02B9 Other recurrent and persistent immunoglobulin A nephropathy: Secondary | ICD-10-CM

## 2017-08-26 HISTORY — DX: Other recurrent and persistent immunoglobulin A nephropathy: N02.B9

## 2017-08-26 HISTORY — DX: Recurrent and persistent hematuria with other morphologic changes: N02.8

## 2017-08-26 LAB — BPAM RBC
Blood Product Expiration Date: 201811062359
Blood Product Expiration Date: 201811082359
ISSUE DATE / TIME: 201810221838
ISSUE DATE / TIME: 201810222116
Unit Type and Rh: 9500
Unit Type and Rh: 9500

## 2017-08-26 LAB — TYPE AND SCREEN
ABO/RH(D): A POS
Antibody Screen: NEGATIVE
UNIT DIVISION: 0
Unit division: 0

## 2017-08-26 LAB — URINALYSIS, ROUTINE W REFLEX MICROSCOPIC
BILIRUBIN URINE: NEGATIVE
Glucose, UA: NEGATIVE mg/dL
Ketones, ur: NEGATIVE mg/dL
Leukocytes, UA: NEGATIVE
Nitrite: NEGATIVE
PH: 5 (ref 5.0–8.0)
Protein, ur: >=300 mg/dL — AB
SPECIFIC GRAVITY, URINE: 1.016 (ref 1.005–1.030)
SQUAMOUS EPITHELIAL / LPF: NONE SEEN

## 2017-08-26 LAB — BASIC METABOLIC PANEL
Anion gap: 7 (ref 5–15)
BUN: 9 mg/dL (ref 6–20)
CALCIUM: 8.1 mg/dL — AB (ref 8.9–10.3)
CO2: 26 mmol/L (ref 22–32)
CREATININE: 1.02 mg/dL (ref 0.61–1.24)
Chloride: 103 mmol/L (ref 101–111)
GFR calc non Af Amer: >60 mL/min (ref >60)
GLUCOSE: 113 mg/dL — AB (ref 65–99)
Potassium: 3.2 mmol/L — ABNORMAL LOW (ref 3.5–5.1)
Sodium: 136 mmol/L (ref 135–145)

## 2017-08-26 LAB — RAPID URINE DRUG SCREEN, HOSP PERFORMED
AMPHETAMINES: NOT DETECTED
BARBITURATES: NOT DETECTED
Benzodiazepines: NOT DETECTED
COCAINE: NOT DETECTED
Opiates: POSITIVE — AB
TETRAHYDROCANNABINOL: NOT DETECTED

## 2017-08-26 LAB — CBC
HEMATOCRIT: 40 % (ref 39.0–52.0)
Hemoglobin: 13.3 g/dL (ref 13.0–17.0)
MCH: 27.8 pg (ref 26.0–34.0)
MCHC: 33.3 g/dL (ref 30.0–36.0)
MCV: 83.7 fL (ref 78.0–100.0)
Platelets: 221 10*3/uL (ref 150–400)
RBC: 4.78 MIL/uL (ref 4.22–5.81)
RDW: 13.1 % (ref 11.5–15.5)
WBC: 10 10*3/uL (ref 4.0–10.5)

## 2017-08-26 LAB — SAMPLE TO BLOOD BANK

## 2017-08-26 LAB — BLOOD PRODUCT ORDER (VERBAL) VERIFICATION

## 2017-08-26 MED ORDER — ACETAMINOPHEN 325 MG PO TABS
650.0000 mg | ORAL_TABLET | Freq: Four times a day (QID) | ORAL | Status: DC
  Administered 2017-08-26 – 2017-08-27 (×4): 650 mg via ORAL
  Filled 2017-08-26 (×4): qty 2

## 2017-08-26 MED ORDER — INFLUENZA VAC SPLIT QUAD 0.5 ML IM SUSY
0.5000 mL | PREFILLED_SYRINGE | INTRAMUSCULAR | Status: AC
  Administered 2017-08-27: 0.5 mL via INTRAMUSCULAR
  Filled 2017-08-26: qty 0.5

## 2017-08-26 MED ORDER — LISINOPRIL 40 MG PO TABS
40.0000 mg | ORAL_TABLET | Freq: Every day | ORAL | Status: DC
  Administered 2017-08-26: 40 mg via ORAL
  Filled 2017-08-26: qty 1

## 2017-08-26 MED ORDER — POTASSIUM CHLORIDE 10 MEQ/100ML IV SOLN
10.0000 meq | INTRAVENOUS | Status: AC
  Administered 2017-08-26 (×3): 10 meq via INTRAVENOUS
  Filled 2017-08-26 (×3): qty 100

## 2017-08-26 MED ORDER — OMEGA-3-ACID ETHYL ESTERS 1 G PO CAPS
2.0000 g | ORAL_CAPSULE | Freq: Two times a day (BID) | ORAL | Status: DC
  Administered 2017-08-26 – 2017-08-27 (×2): 2 g via ORAL
  Filled 2017-08-26 (×2): qty 2
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
## 2017-08-27 ENCOUNTER — Encounter (HOSPITAL_COMMUNITY): Payer: Self-pay

## 2017-08-27 MED ORDER — TRAMADOL HCL 50 MG PO TABS: 50.0000 mg | ORAL_TABLET | Freq: Four times a day (QID) | ORAL | Status: DC

## 2017-08-27 MED ORDER — OXYCODONE HCL 5 MG PO TABS: 5.0000 mg | ORAL_TABLET | ORAL | 0 refills | Status: DC | PRN

## 2017-08-27 MED ORDER — TRAMADOL HCL 50 MG PO TABS: 50.0000 mg | ORAL_TABLET | Freq: Four times a day (QID) | ORAL | 0 refills | Status: DC | PRN

## 2017-08-27 MED ORDER — ENSURE ENLIVE PO LIQD
237.0000 mL | Freq: Two times a day (BID) | ORAL | Status: DC
  Administered 2017-08-27: 237 mL via ORAL

## 2017-08-27 MED ORDER — MORPHINE SULFATE (PF) 4 MG/ML IV SOLN: 1.0000 mg | Freq: Three times a day (TID) | INTRAVENOUS | Status: DC | PRN

## 2017-08-27 NOTE — Progress Notes (Signed)
Discharge home. Home discharge instruction given, no question verbalized. 
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
## 2017-08-27 NOTE — Discharge Instructions (Signed)
Ok to shower with wound open Change dressing once daily and as needed for saturation - apply Xeroform gauze then dry gauze and secure with tape Monitor for any fever, chills, redness around wound, or drain from wound

## 2017-08-29 ENCOUNTER — Telehealth (HOSPITAL_COMMUNITY): Payer: Self-pay

## 2017-08-29 NOTE — Telephone Encounter (Signed)
Returned call and spoke with patient's wife. She states that he has some pain with deep inspiration, but he does not feel short of breath or like he can't breath. Discussed that with small lung laceration this is not unusual. Advised alternating oxycodone, tramadol, and tylenol for pain relief. Advised that if he begins to feel short of breath and have sharp severe pain that she should bring him to the ED. Wife also concerned about some yellowish drainage from wound. States patient has been afebrile. Discussed keeping wound clean and increasing dressing changes from twice daily to 3-4 times per day. Advised that if amount of drainage increases or patient becomes febrile that he will need to be seen sooner. He will follow up in the trauma clinic on Tuesday.   Wells Guiles , [facility] 08/29/2017, 3:01 PM Pager: [phone] Mon-Fri 7:00 am-4:30 pm Sat-Sun 7:00 am-11:30 am

## 2017-08-29 NOTE — Telephone Encounter (Signed)
[phone]  Pt's wife called and stated her husband is having severe pain in the left side of his chest.  She would like someone to call and discuss.

## 2017-09-02 ENCOUNTER — Other Ambulatory Visit (HOSPITAL_COMMUNITY): Payer: Self-pay | Admitting: Physician Assistant

## 2017-09-02 ENCOUNTER — Ambulatory Visit (HOSPITAL_COMMUNITY)
Admission: RE | Admit: 2017-09-02 | Discharge: 2017-09-02 | Disposition: A | Discharge status: Home / Self Care | Payer: Self-pay | Source: Ambulatory Visit | Attending: Physician Assistant | Admitting: Physician Assistant

## 2017-09-02 DIAGNOSIS — J90 Pleural effusion, not elsewhere classified: Secondary | ICD-10-CM | POA: Insufficient documentation

## 2017-09-02 DIAGNOSIS — J942 Hemothorax: Secondary | ICD-10-CM

## 2017-09-02 DIAGNOSIS — J9811 Atelectasis: Secondary | ICD-10-CM | POA: Insufficient documentation

## 2017-09-02 MED FILL — CEPHALEXIN 500 MG CAPSULE: 500 | 7 days supply | Qty: 28 | Fill #0

## 2018-01-01 ENCOUNTER — Other Ambulatory Visit: Payer: Self-pay | Admitting: Family Medicine

## 2018-01-01 DIAGNOSIS — N028 Recurrent and persistent hematuria with other morphologic changes: Secondary | ICD-10-CM

## 2018-05-27 MED FILL — LISINOPRIL 40 MG TABLET: 40 | 30 days supply | Qty: 30 | Fill #0

## 2018-05-27 MED FILL — OMEGA-3 ETHYL ESTERS 1 GM C: 1 | 30 days supply | Qty: 120 | Fill #0
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
## 2019-08-09 ENCOUNTER — Other Ambulatory Visit: Payer: Self-pay

## 2019-08-09 DIAGNOSIS — Z20822 Contact with and (suspected) exposure to covid-19: Secondary | ICD-10-CM

## 2019-08-10 LAB — NOVEL CORONAVIRUS, NAA: SARS-CoV-2, NAA: NOT DETECTED

## 2019-08-11 ENCOUNTER — Telehealth: Payer: Self-pay

## 2019-08-11 NOTE — Telephone Encounter (Signed)
Provided covid lab test to Patient.  Wife .Marland Kitchen Voiced understanding.

## 2019-12-02 ENCOUNTER — Ambulatory Visit: Payer: Self-pay | Attending: Internal Medicine

## 2019-12-02 DIAGNOSIS — Z20822 Contact with and (suspected) exposure to covid-19: Secondary | ICD-10-CM | POA: Insufficient documentation

## 2019-12-03 LAB — NOVEL CORONAVIRUS, NAA: SARS-CoV-2, NAA: NOT DETECTED

## 2019-12-30 ENCOUNTER — Ambulatory Visit: Payer: Self-pay | Attending: Internal Medicine

## 2019-12-30 DIAGNOSIS — Z20822 Contact with and (suspected) exposure to covid-19: Secondary | ICD-10-CM | POA: Insufficient documentation

## 2019-12-31 LAB — NOVEL CORONAVIRUS, NAA: SARS-CoV-2, NAA: NOT DETECTED

## 2021-05-15 ENCOUNTER — Ambulatory Visit: Payer: Self-pay | Admitting: Family Medicine

## 2021-07-31 ENCOUNTER — Other Ambulatory Visit (HOSPITAL_COMMUNITY): Payer: Self-pay | Admitting: Nephrology

## 2021-07-31 DIAGNOSIS — N1831 Chronic kidney disease, stage 3a: Secondary | ICD-10-CM

## 2021-07-31 DIAGNOSIS — N028 Recurrent and persistent hematuria with other morphologic changes: Secondary | ICD-10-CM

## 2021-08-20 ENCOUNTER — Other Ambulatory Visit: Payer: Self-pay | Admitting: Radiology

## 2021-08-21 ENCOUNTER — Other Ambulatory Visit: Payer: Self-pay

## 2021-08-21 ENCOUNTER — Encounter (HOSPITAL_COMMUNITY): Payer: Self-pay

## 2021-08-21 ENCOUNTER — Ambulatory Visit (HOSPITAL_COMMUNITY)
Admission: RE | Admit: 2021-08-21 | Discharge: 2021-08-21 | Disposition: A | Discharge status: Home / Self Care | Payer: Medicaid Other | Source: Ambulatory Visit | Attending: Nephrology | Admitting: Nephrology

## 2021-08-21 DIAGNOSIS — N183 Chronic kidney disease, stage 3 unspecified: Secondary | ICD-10-CM | POA: Diagnosis not present

## 2021-08-21 DIAGNOSIS — N028 Recurrent and persistent hematuria with other morphologic changes: Secondary | ICD-10-CM | POA: Insufficient documentation

## 2021-08-21 DIAGNOSIS — Z79899 Other long term (current) drug therapy: Secondary | ICD-10-CM | POA: Insufficient documentation

## 2021-08-21 DIAGNOSIS — R3129 Other microscopic hematuria: Secondary | ICD-10-CM | POA: Diagnosis present

## 2021-08-21 DIAGNOSIS — Z87891 Personal history of nicotine dependence: Secondary | ICD-10-CM | POA: Diagnosis not present

## 2021-08-21 DIAGNOSIS — R809 Proteinuria, unspecified: Secondary | ICD-10-CM | POA: Diagnosis present

## 2021-08-21 DIAGNOSIS — Z7984 Long term (current) use of oral hypoglycemic drugs: Secondary | ICD-10-CM | POA: Insufficient documentation

## 2021-08-21 DIAGNOSIS — I129 Hypertensive chronic kidney disease with stage 1 through stage 4 chronic kidney disease, or unspecified chronic kidney disease: Secondary | ICD-10-CM | POA: Diagnosis present

## 2021-08-21 DIAGNOSIS — N1831 Chronic kidney disease, stage 3a: Secondary | ICD-10-CM

## 2021-08-21 LAB — CBC
HCT: 41.8 % (ref 39.0–52.0)
Hemoglobin: 14.3 g/dL (ref 13.0–17.0)
MCH: 29.2 pg (ref 26.0–34.0)
MCHC: 34.2 g/dL (ref 30.0–36.0)
MCV: 85.3 fL (ref 80.0–100.0)
Platelets: 267 10*3/uL (ref 150–400)
RBC: 4.9 MIL/uL (ref 4.22–5.81)
RDW: 12.3 % (ref 11.5–15.5)
WBC: 7.1 10*3/uL (ref 4.0–10.5)
nRBC: 0 % (ref 0.0–0.2)

## 2021-08-21 LAB — PROTIME-INR
INR: 0.9 (ref 0.8–1.2)
Prothrombin Time: 12.3 seconds (ref 11.4–15.2)

## 2021-08-21 MED ORDER — GELATIN ABSORBABLE 12-7 MM EX MISC
CUTANEOUS | Status: AC
  Filled 2021-08-21: qty 1

## 2021-08-21 MED ORDER — MIDAZOLAM HCL 2 MG/2ML IJ SOLN
INTRAMUSCULAR | Status: AC
  Filled 2021-08-21: qty 2

## 2021-08-21 MED ORDER — ACETAMINOPHEN 325 MG PO TABS
ORAL_TABLET | ORAL | Status: AC
  Filled 2021-08-21: qty 2

## 2021-08-21 MED ORDER — ACETAMINOPHEN 325 MG PO TABS
650.0000 mg | ORAL_TABLET | Freq: Once | ORAL | Status: AC
  Administered 2021-08-21: 650 mg via ORAL
  Filled 2021-08-21: qty 2

## 2021-08-21 MED ORDER — MIDAZOLAM HCL 2 MG/2ML IJ SOLN
INTRAMUSCULAR | Status: DC | PRN
  Administered 2021-08-21 (×2): 1 mg via INTRAVENOUS

## 2021-08-21 MED ORDER — SODIUM CHLORIDE 0.9 % IV SOLN: INTRAVENOUS | Status: DC

## 2021-08-21 MED ORDER — LIDOCAINE HCL (PF) 1 % IJ SOLN
INTRAMUSCULAR | Status: AC
  Filled 2021-08-21: qty 30

## 2021-08-21 MED ORDER — FENTANYL CITRATE (PF) 100 MCG/2ML IJ SOLN
INTRAMUSCULAR | Status: DC | PRN
  Administered 2021-08-21 (×2): 50 ug via INTRAVENOUS

## 2021-08-21 MED ORDER — FENTANYL CITRATE (PF) 100 MCG/2ML IJ SOLN
INTRAMUSCULAR | Status: AC
  Filled 2021-08-21: qty 2

## 2021-08-21 NOTE — H&P (Signed)
Chief Complaint: Patient was seen in consultation today for random renal biopsy at the request of Sanford,Ryan B  Referring Physician(s): Sanford,Ryan B  Supervising Physician: Richarda Overlie  Patient Status: [facility] - Out-pt  History of Present Illness: Gary Cox is a 35 y.o. 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  Medications: Prior to Admission medications   Medication Sig Start Date End Date Taking? Authorizing Provider  dapagliflozin propanediol (FARXIGA) 10 MG TABS tablet Take 10 mg by mouth in the morning.   Yes [provider]  lisinopril (ZESTRIL) 5 MG tablet Take 5 mg by mouth in the morning. 04/12/21  Yes [provider]  acetaminophen (TYLENOL) 500 MG tablet Take 1,000 mg by mouth every 6 (six) hours as needed (pain).    [provider]  Omega-3 Fatty Acids (FISH OIL) 1000 MG CAPS Take 1,000 mg by mouth in the morning.    [provider]     Family History  Problem Relation Age of Onset   Hypertension Mother    Hypertension Father     Social History   Socioeconomic History   Marital status: Married    Spouse name: Not on file   Number of children: Not on file   Years of education: Not on file   Highest education level: Not on file  Occupational History   Not on file  Tobacco Use   Smoking status: Former    Packs/day: 1.00    Years: 3.00    Pack years: 3.00    Types: Cigarettes    Quit date: 08/27/2015    Years since quitting: 5.9   Smokeless tobacco: Never  Vaping Use   Vaping Use: Never used  Substance and Sexual Activity   Alcohol use: No   Drug  use: No   Sexual activity: Yes  Other Topics Concern   Not on file  Social History Narrative   ** Merged History Encounter **       Social Determinants of Health   Financial Resource Strain: Not on file  Food Insecurity: Not on file  Transportation Needs: Not on file  Physical Activity: Not on file  Stress: Not on file  Social Connections: Not on file    Review of Systems: A 12 point ROS discussed and pertinent positives are indicated in the HPI above.  All other systems are negative.  Review of Systems  Constitutional:  Negative for activity change, fatigue and fever.  Respiratory:  Negative for cough and shortness of breath.   Cardiovascular:  Negative for chest pain.  Gastrointestinal:  Negative for abdominal pain.  Neurological:  Negative for weakness.  Psychiatric/Behavioral:  Negative for behavioral problems and confusion.    Vital Signs: BP 128/85   Pulse 68   Temp 97.8 F (36.6 C) (Oral)   Resp 16   Ht 5\' 7"  (1.702 m)   Wt 165 lb (74.8 kg)   SpO2 98%   BMI 25.84 kg/m   Physical Exam Vitals reviewed.  Constitutional:      Appearance: Normal appearance.  HENT:     Mouth/Throat:  Mouth: Mucous membranes are moist.  Cardiovascular:     Rate and Rhythm: Normal rate and regular rhythm.     Heart sounds: Normal heart sounds.  Pulmonary:     Effort: Pulmonary effort is normal.     Breath sounds: Normal breath sounds.  Abdominal:     Palpations: Abdomen is soft.  Musculoskeletal:        General: Normal range of motion.     Right lower leg: No edema.     Left lower leg: No edema.  Neurological:     Mental Status: He is oriented to person, place, and time.  Psychiatric:        Behavior: Behavior normal.    Imaging: No results found.  Labs:  CBC: No results for input(s): WBC, HGB, HCT, PLT in the last 8760 hours.  COAGS: No results for input(s): INR, APTT in the last 8760 hours.  BMP: No results for input(s): NA, K, CL, CO2, GLUCOSE, BUN,  CALCIUM, CREATININE, GFRNONAA, GFRAA in the last 8760 hours.  Invalid input(s): CMP  LIVER FUNCTION TESTS: No results for input(s): BILITOT, AST, ALT, ALKPHOS, PROT, ALBUMIN in the last 8760 hours.  TUMOR MARKERS: No results for input(s): AFPTM, CEA, CA199, CHROMGRNA in the last 8760 hours.  Assessment and Plan:  CKD 3 Proteinuria Hematuria HTN Presumed IgA nephropathy Scheduled for random renal biopsy Risks and benefits of random renal biopsy was discussed with the patient and/or patient's family including, but not limited to bleeding, infection, damage to adjacent structures or low yield requiring additional tests.  All of the questions were answered and there is agreement to proceed.  Consent signed and in chart.   Thank you for this interesting consult.  I greatly enjoyed meeting Gary Cox and look forward to participating in their care.  A copy of this report was sent to the requesting provider on this date.  Electronically Signed: Robet Leu, PA-C 08/21/2021, 7:20 AM   I spent a total of  30 Minutes   in face to face in clinical consultation, greater than 50% of which was counseling/coordinating care for random renal biopsy

## 2021-08-21 NOTE — Sedation Documentation (Signed)
Attempted to call short stay to give a returning report. No one available to take report and was told they will call back.

## 2021-08-21 NOTE — Procedures (Signed)
Interventional Radiology Procedure:   Indications: Proteinuria and presumed IgA Nephropathy  Procedure: US guided left renal biopsy  Findings: 2 cores from left kidney lower pole and placed in saline.   Complications: No immediate complications noted.     EBL: Minimal  Plan: Bedrest 4 hours, then discharge to home.   Quandarius Nill R. Lowella Dandy, MD  Pager: [phone]

## 2021-08-28 ENCOUNTER — Encounter (HOSPITAL_COMMUNITY): Payer: Self-pay

## 2021-08-28 LAB — SURGICAL PATHOLOGY

## 2022-07-24 IMAGING — US US BIOPSY
1 series · 13 of 20 positions shown · non-contrast
Comparison: none

INDICATION: 35-year-old with stage 3 chronic kidney disease and presumed Rafezah
nephropathy. Request for renal biopsy.

[Series 1: us biopsy (kidney) · 13 of 20 slices shown]
[im 1/20]
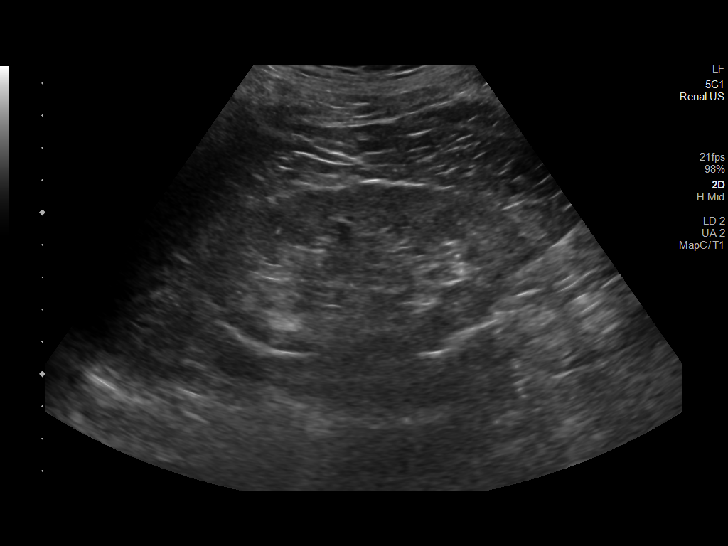
[im 3/20]
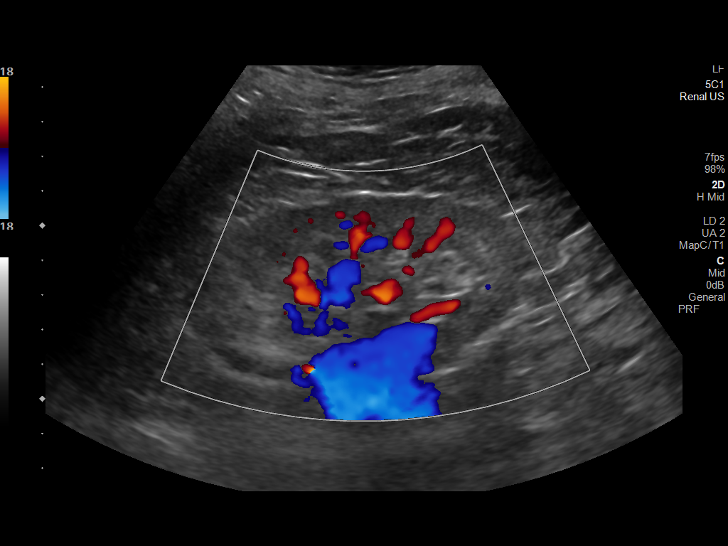
[im 4/20]
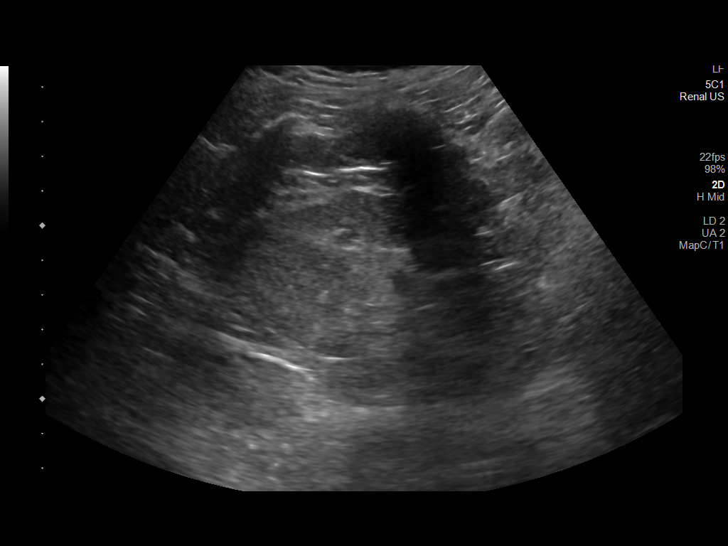
[im 6/20]
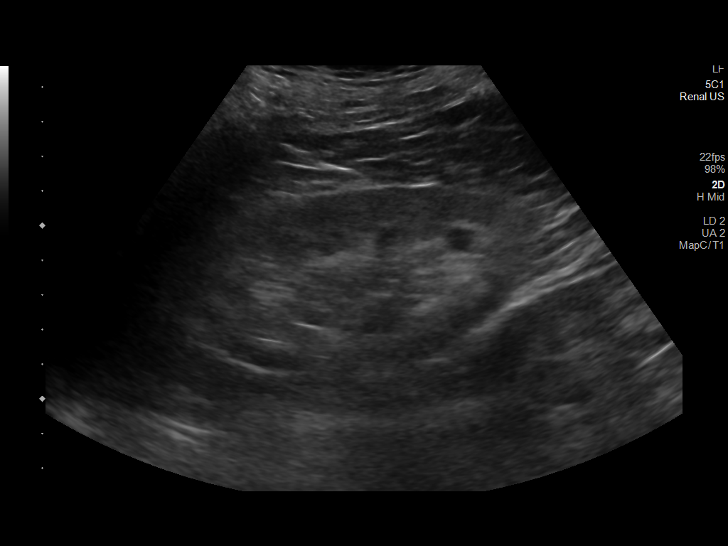
[im 7/20]
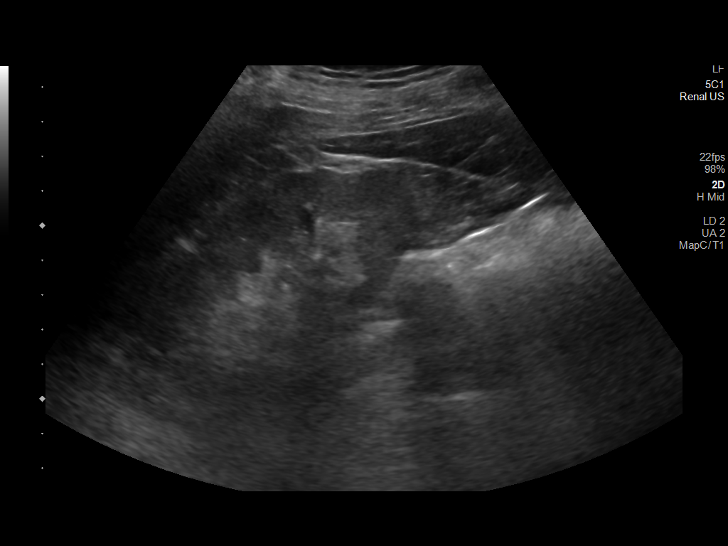
[im 9/20]
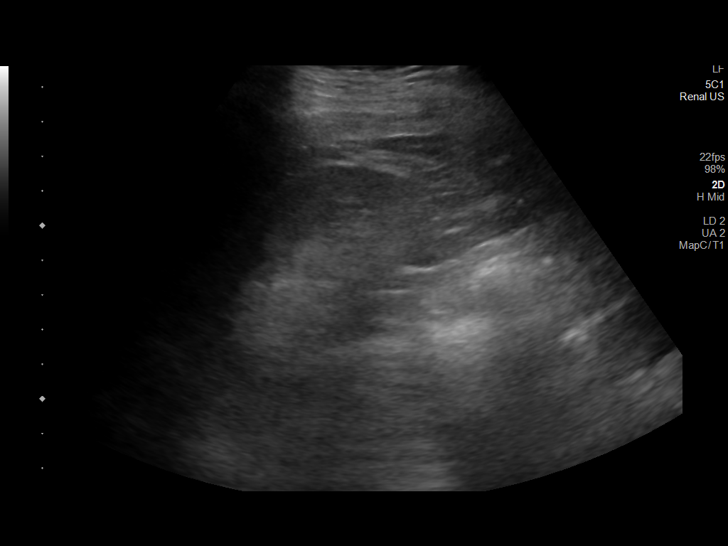
[im 11/20]
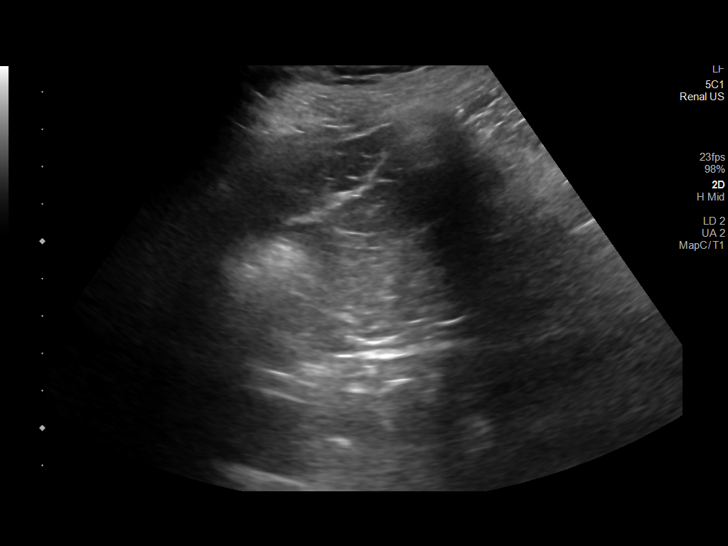
[im 12/20]
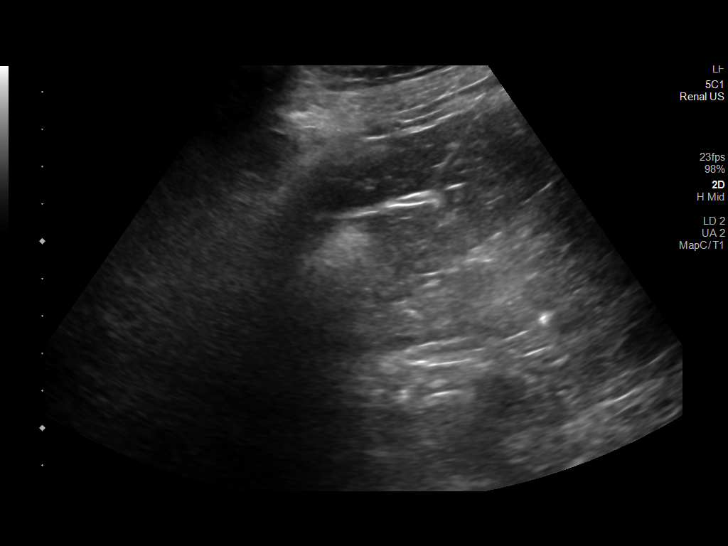
[im 14/20]
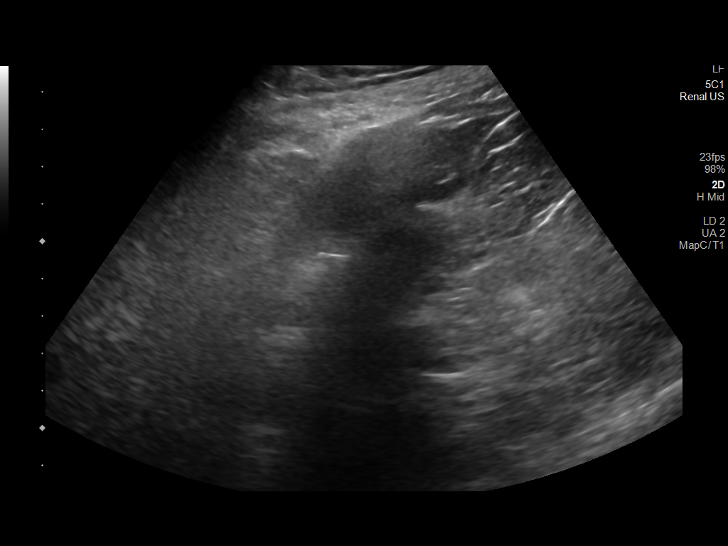
[im 15/20]
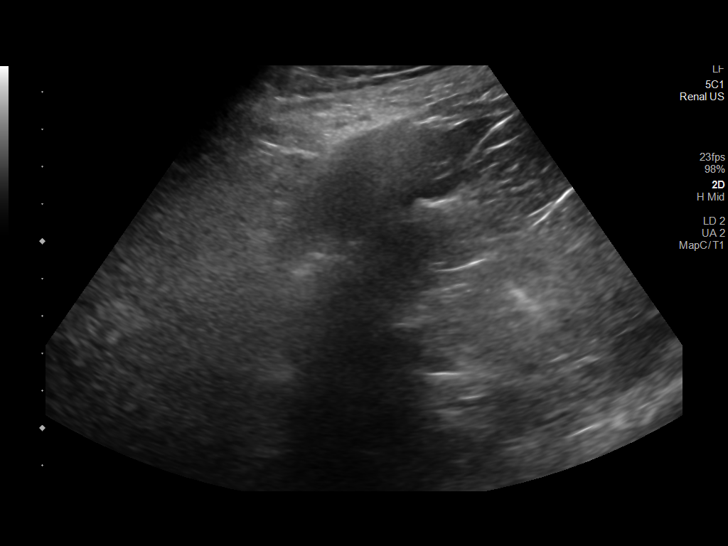
[im 17/20]
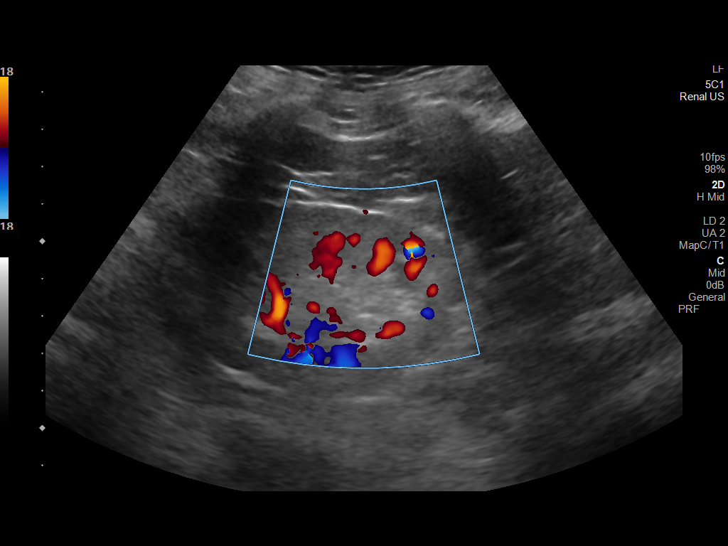
[im 18/20]
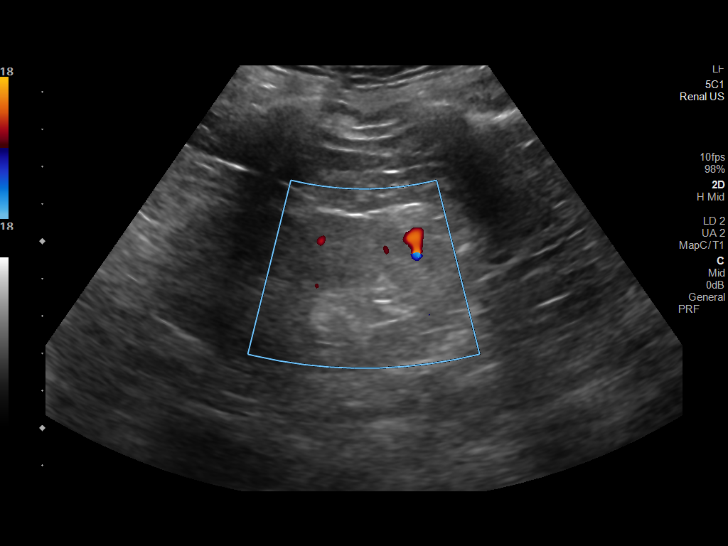
[im 20/20]
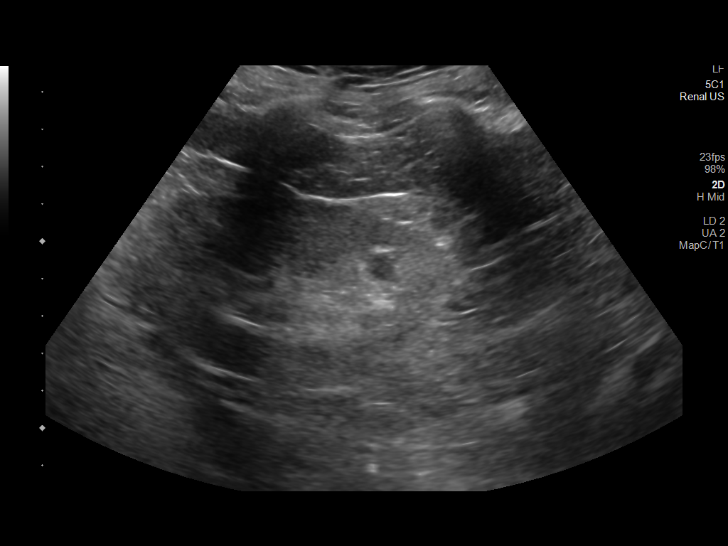

[13 of 20 positions shown; findings below may reference images not displayed]

EXAM:
Ultrasound-guided left renal biopsy

MEDICATIONS:
None.

ANESTHESIA/SEDATION:
Moderate (conscious) sedation was employed during this procedure. A
total of Versed 2.0 mg and Fentanyl 100 mcg was administered
intravenously.

Moderate Sedation Time: 25 minutes. The patient's level of
consciousness and vital signs were monitored continuously by
radiology nursing throughout the procedure under my direct
supervision.

FLUOROSCOPY TIME:  Fluoroscopy Time: None

COMPLICATIONS:
None immediate.

PROCEDURE:
Informed written consent was obtained from the patient after a
thorough discussion of the procedural risks, benefits and
alternatives. All questions were addressed. Maximal Sterile Barrier
Technique was utilized including caps, mask, sterile gowns, sterile
gloves, sterile drape, hand hygiene and skin antiseptic. A timeout
was performed prior to the initiation of the procedure.

Patient was placed prone. Both kidneys were evaluated with
ultrasound. Left kidney was targeted for biopsy. The left flank was
prepped and draped in sterile fashion. Skin was anesthetized using
1% lidocaine. Small incision was made. Using ultrasound guidance, a
16 gauge core needle was directed in the left kidney lower pole. A
total of 2 core biopsies were obtained from the left kidney lower
pole. Specimens placed in saline. Bandage placed over the puncture
site.
FINDINGS: Both kidneys are echogenic without hydronephrosis. Core biopsies
obtained from the left kidney lower pole. No immediate bleeding or
hematoma formation.
IMPRESSION: Ultrasound-guided core biopsies from the left kidney lower pole.

## 2022-10-14 ENCOUNTER — Other Ambulatory Visit (HOSPITAL_COMMUNITY): Payer: Self-pay | Admitting: Nephrology

## 2022-10-14 DIAGNOSIS — I129 Hypertensive chronic kidney disease with stage 1 through stage 4 chronic kidney disease, or unspecified chronic kidney disease: Secondary | ICD-10-CM

## 2022-10-14 DIAGNOSIS — R809 Proteinuria, unspecified: Secondary | ICD-10-CM

## 2022-10-14 DIAGNOSIS — N1831 Chronic kidney disease, stage 3a: Secondary | ICD-10-CM

## 2022-11-06 ENCOUNTER — Other Ambulatory Visit: Payer: Self-pay | Admitting: Student

## 2022-11-06 ENCOUNTER — Other Ambulatory Visit: Payer: Self-pay | Admitting: Radiology

## 2022-11-06 DIAGNOSIS — N189 Chronic kidney disease, unspecified: Secondary | ICD-10-CM

## 2022-11-06 NOTE — H&P (Signed)
Chief Complaint: Patient was seen in consultation today for random renal biopsy at the request of Sanford,Ryan B  Referring Physician(s): Hubbard B  Supervising Physician: Mir, Sharen Heck  Patient Status: [facility] - Out-pt  History of Present Illness: Gary Cox is a 37 y.o. male with past medical history significant for IgA nephropathy, CKD stage III, renal hypertension and proteinuria.  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  Patient is followed by [facility] and was referred by Pearson Grippe, MD, for repeat random renal biopsy due to decline in GFR and persistent nephrotic proteinuria.  Pt denies CP, SOB, N/V, HA or abnormal bleeding.  He is Npo per order. He denies the use of AC/APs  Past Medical History:  Diagnosis Date   IgA nephropathy 08/26/2017   Renal disorder    UTI (urinary tract infection)     Past Surgical History:  Procedure Laterality Date   CYST REMOVAL NECK      Allergies: Patient has no known allergies.  Medications: Prior to Admission medications   Medication Sig Start Date End Date Taking? Authorizing Provider  dapagliflozin propanediol (FARXIGA) 10 MG TABS tablet Take 10 mg by mouth in the morning.   Yes [provider]  lisinopril (ZESTRIL) 10 MG tablet Take 10 mg by mouth at bedtime.   Yes [provider]  LOKELMA 5 g packet Take 1 packet by mouth daily before breakfast. 10/15/22  Yes [provider]  mycophenolate (CELLCEPT) 500 MG tablet Take 500 mg by mouth 2 (two) times daily. 10/15/22  Yes [provider]     Family History  Problem Relation Age of Onset   Hypertension Mother    Hypertension Father     Social History   Socioeconomic History   Marital status: Married    Spouse name: Not on file   Number of children: Not on file   Years of  education: Not on file   Highest education level: Not on file  Occupational History   Not on file  Tobacco Use   Smoking status: Former    Packs/day: 1.00    Years: 3.00    Total pack years: 3.00    Types: Cigarettes    Quit date: 08/27/2015    Years since quitting: 7.2   Smokeless tobacco: Never  Vaping Use   Vaping Use: Never used  Substance and Sexual Activity   Alcohol use: No   Drug use: No   Sexual activity: Yes  Other Topics Concern   Not on file  Social History Narrative   ** Merged History Encounter **       Social Determinants of Health   Financial Resource Strain: Not on file  Food Insecurity: Not on file  Transportation Needs: Not on file  Physical Activity: Not on file  Stress: Not on file  Social Connections: Not on file    Review of Systems: A 12 point ROS discussed and pertinent positives are indicated in the HPI above.  All other systems are negative.  Review of Systems  All other systems reviewed and are negative.   Vital Signs: BP (!) 130/96   Pulse 82   Temp (!) 97.4 F (36.3 C) (Temporal)   Resp 17   Ht 5\' 7"  (1.702 m)   Wt 156 lb 8.4 oz (71 kg)   SpO2 96%   BMI 24.52 kg/m     Physical Exam Vitals reviewed.  Constitutional:      General: He is not in acute distress.    Appearance: Normal appearance. He is not ill-appearing.  HENT:     Head: Normocephalic and atraumatic.     Mouth/Throat:     Mouth: Mucous membranes are dry.     Pharynx: Oropharynx is clear.  Eyes:     Extraocular Movements: Extraocular movements intact.     Pupils: Pupils are equal, round, and reactive to light.  Cardiovascular:     Rate and Rhythm: Normal rate and regular rhythm.     Pulses: Normal pulses.     Heart sounds: Normal heart sounds. No murmur heard. Pulmonary:     Effort: Pulmonary effort is normal. No respiratory distress.     Breath sounds: Normal breath sounds.  Abdominal:     General: Bowel sounds are normal. There is no distension.      Palpations: Abdomen is soft.     Tenderness: There is no abdominal tenderness. There is no guarding.  Musculoskeletal:     Right lower leg: No edema.     Left lower leg: No edema.  Skin:    General: Skin is warm and dry.  Neurological:     Mental Status: He is alert.  Psychiatric:        Mood and Affect: Mood normal.        Behavior: Behavior normal.        Thought Content: Thought content normal.        Judgment: Judgment normal.     Imaging: No results found.  Labs:  CBC: Recent Labs    11/07/22 0648  WBC 6.3  HGB 13.7  HCT 39.7  PLT 253    COAGS: Recent Labs    11/07/22 0648  INR 1.0    BMP: No results for input(s): "NA", "K", "CL", "CO2", "GLUCOSE", "BUN", "CALCIUM", "CREATININE", "GFRNONAA", "GFRAA" in the last 8760 hours.  Invalid input(s): "CMP"  LIVER FUNCTION TESTS: No results for input(s): "BILITOT", "AST", "ALT", "ALKPHOS", "PROT", "ALBUMIN" in the last 8760 hours.  TUMOR MARKERS: No results for input(s): "AFPTM", "CEA", "CA199", "CHROMGRNA" in the last 8760 hours.  Assessment and Plan: 37 year old male presents to IR for random renal biopsy due to decreasing GFR and persistent proteinuria.  Pt resting in bed.  He is A&O and calm.  He is in no distress.  VSS, labs WNL  Risks and benefits of random renal biopsy with moderate sedation was discussed with the patient and/or patient's family including, but not limited to bleeding, infection, damage to adjacent structures or low yield requiring additional tests.  All of the questions were answered and there is agreement to proceed.  Consent signed and in chart. Thank you for this interesting consult.  I greatly enjoyed meeting Gary Cox and look forward to participating in their care.  A copy of this report was sent to the requesting provider on this date.  Electronically Signed: Tyson Alias, NP 11/07/2022, 7:37 AM   I spent a total of 20 minutes in face to face in clinical consultation,  greater than 50% of which was counseling/coordinating care for IgA nephropathy and proteinuria.

## 2022-11-07 ENCOUNTER — Ambulatory Visit (HOSPITAL_COMMUNITY)
Admission: RE | Admit: 2022-11-07 | Discharge: 2022-11-07 | Disposition: A | Discharge status: Home / Self Care | Payer: Medicaid Other | Source: Ambulatory Visit | Attending: Nephrology | Admitting: Nephrology

## 2022-11-07 ENCOUNTER — Other Ambulatory Visit: Payer: Self-pay

## 2022-11-07 ENCOUNTER — Encounter (HOSPITAL_COMMUNITY): Payer: Self-pay

## 2022-11-07 DIAGNOSIS — I129 Hypertensive chronic kidney disease with stage 1 through stage 4 chronic kidney disease, or unspecified chronic kidney disease: Secondary | ICD-10-CM | POA: Insufficient documentation

## 2022-11-07 DIAGNOSIS — N02B1 Recurrent and persistent immunoglobulin A nephropathy with glomerular lesion: Secondary | ICD-10-CM | POA: Diagnosis not present

## 2022-11-07 DIAGNOSIS — N041 Nephrotic syndrome with focal and segmental glomerular lesions: Secondary | ICD-10-CM | POA: Insufficient documentation

## 2022-11-07 DIAGNOSIS — R809 Proteinuria, unspecified: Secondary | ICD-10-CM | POA: Diagnosis not present

## 2022-11-07 DIAGNOSIS — N189 Chronic kidney disease, unspecified: Secondary | ICD-10-CM

## 2022-11-07 DIAGNOSIS — Z87891 Personal history of nicotine dependence: Secondary | ICD-10-CM | POA: Insufficient documentation

## 2022-11-07 DIAGNOSIS — N1831 Chronic kidney disease, stage 3a: Secondary | ICD-10-CM | POA: Diagnosis present

## 2022-11-07 LAB — CBC
HCT: 39.7 % (ref 39.0–52.0)
Hemoglobin: 13.7 g/dL (ref 13.0–17.0)
MCH: 29 pg (ref 26.0–34.0)
MCHC: 34.5 g/dL (ref 30.0–36.0)
MCV: 83.9 fL (ref 80.0–100.0)
Platelets: 253 10*3/uL (ref 150–400)
RBC: 4.73 MIL/uL (ref 4.22–5.81)
RDW: 12.7 % (ref 11.5–15.5)
WBC: 6.3 10*3/uL (ref 4.0–10.5)
nRBC: 0 % (ref 0.0–0.2)

## 2022-11-07 LAB — PROTIME-INR
INR: 1 (ref 0.8–1.2)
Prothrombin Time: 12.6 seconds (ref 11.4–15.2)

## 2022-11-07 MED ORDER — LIDOCAINE HCL (PF) 1 % IJ SOLN
INTRAMUSCULAR | Status: AC
  Filled 2022-11-07: qty 30

## 2022-11-07 MED ORDER — GELATIN ABSORBABLE 12-7 MM EX MISC
1.0000 | Freq: Once | CUTANEOUS | Status: AC
  Administered 2022-11-07: 1 via TOPICAL

## 2022-11-07 MED ORDER — MIDAZOLAM HCL 2 MG/2ML IJ SOLN
INTRAMUSCULAR | Status: AC | PRN
  Administered 2022-11-07: 1 mg via INTRAVENOUS
  Administered 2022-11-07: .5 mg via INTRAVENOUS

## 2022-11-07 MED ORDER — GELATIN ABSORBABLE 12-7 MM EX MISC
CUTANEOUS | Status: AC
  Filled 2022-11-07: qty 1

## 2022-11-07 MED ORDER — FENTANYL CITRATE (PF) 100 MCG/2ML IJ SOLN
INTRAMUSCULAR | Status: AC
  Filled 2022-11-07: qty 2

## 2022-11-07 MED ORDER — SODIUM CHLORIDE 0.9 % IV SOLN: INTRAVENOUS | Status: DC

## 2022-11-07 MED ORDER — HYDROCODONE-ACETAMINOPHEN 5-325 MG PO TABS: 1.0000 | ORAL_TABLET | ORAL | Status: DC | PRN

## 2022-11-07 MED ORDER — MIDAZOLAM HCL 2 MG/2ML IJ SOLN
INTRAMUSCULAR | Status: AC
  Filled 2022-11-07: qty 2

## 2022-11-07 MED ORDER — FENTANYL CITRATE (PF) 100 MCG/2ML IJ SOLN
INTRAMUSCULAR | Status: AC | PRN
  Administered 2022-11-07: 25 ug via INTRAVENOUS
  Administered 2022-11-07: 50 ug via INTRAVENOUS

## 2022-11-07 MED ORDER — LIDOCAINE HCL (PF) 1 % IJ SOLN
10.0000 mL | Freq: Once | INTRAMUSCULAR | Status: AC
  Administered 2022-11-07: 10 mL via INTRADERMAL

## 2022-11-07 NOTE — Progress Notes (Addendum)
0930. Pt very lethargic, c/o dizziness, pt had 1.5 mg versed, 75 mcg fentanyl, slow pupil reaction to light, can not get pt to do hand grip, command not followed for facial smile but no drooping noted, called radiology PA no answer to pa room, Called Patty charge RN radiology, explained symptoms, she will inform Dr Dwaine Gale and get a rad PA here.  0940 pt able to grip and denies dizziness, Pa came and assessed pt.

## 2022-11-07 NOTE — Progress Notes (Signed)
Called to [facility] due to patient "not doing well."  Patient is s/p random renal bx.  [place] RN states that patient was very drowsy and not able to follow command at all when he arrived to [facility], but he became more a/o and was able to follow commands.   Patient laying in bed, NAD, appears lethargic, no resp distress. VSS A/O and able to answer questions appropriately, denies pain, SOB, lightheadedness, N/V.  R flank puncture site soft and non tender, no sign of acute bleeding.  Pupils normal size bilaterally.   Patient states that the sedation medicine he received at the beginning of the random renal bx was ineffective and he had to get more medicine.   Per chart patient has CKD stage III with declining GFR (No RF in chart since May 2022.) Sx most likely due to sedation meds.   Patient will be monitored in [facility] for couple hours, OK to discharge if he continues to able to follow commands and a/o.    Armando Gang Candice Lunney PA-C 11/07/2022 9:55 AM

## 2022-11-07 NOTE — Procedures (Signed)
Interventional Radiology Procedure Note  Procedure: Random renal biopsy with US guidance  Indication: CKD  Findings: Please refer to procedural dictation for full description.  Complications: None  EBL: < 10 mL  Miachel Roux, MD [phone]

## 2022-11-19 ENCOUNTER — Encounter (HOSPITAL_COMMUNITY): Payer: Self-pay

## 2022-11-27 LAB — SURGICAL PATHOLOGY

## 2023-02-23 ENCOUNTER — Emergency Department (HOSPITAL_COMMUNITY): Payer: Medicaid Other

## 2023-02-23 ENCOUNTER — Encounter (HOSPITAL_COMMUNITY): Payer: Self-pay | Admitting: *Deleted

## 2023-02-23 ENCOUNTER — Other Ambulatory Visit: Payer: Self-pay

## 2023-02-23 ENCOUNTER — Emergency Department (HOSPITAL_COMMUNITY)
Admission: EM | Admit: 2023-02-23 | Discharge: 2023-02-23 | Disposition: A | Discharge status: Home / Self Care | Payer: Medicaid Other | Attending: Emergency Medicine | Admitting: Emergency Medicine

## 2023-02-23 DIAGNOSIS — R1084 Generalized abdominal pain: Secondary | ICD-10-CM | POA: Diagnosis not present

## 2023-02-23 DIAGNOSIS — R109 Unspecified abdominal pain: Secondary | ICD-10-CM | POA: Diagnosis present

## 2023-02-23 DIAGNOSIS — R112 Nausea with vomiting, unspecified: Secondary | ICD-10-CM

## 2023-02-23 LAB — URINALYSIS, ROUTINE W REFLEX MICROSCOPIC
Bacteria, UA: NONE SEEN
Bilirubin Urine: NEGATIVE
Glucose, UA: 150 mg/dL — AB
Ketones, ur: NEGATIVE mg/dL
Leukocytes,Ua: NEGATIVE
Nitrite: NEGATIVE
Protein, ur: >=300 mg/dL — AB
Specific Gravity, Urine: 1.011 (ref 1.005–1.030)
pH: 5 (ref 5.0–8.0)

## 2023-02-23 LAB — COMPREHENSIVE METABOLIC PANEL
ALT: 30 U/L (ref 0–44)
AST: 24 U/L (ref 15–41)
Albumin: 3.1 g/dL — ABNORMAL LOW (ref 3.5–5.0)
Alkaline Phosphatase: 61 U/L (ref 38–126)
Anion gap: 9 (ref 5–15)
BUN: 37 mg/dL — ABNORMAL HIGH (ref 6–20)
CO2: 25 mmol/L (ref 22–32)
Calcium: 9 mg/dL (ref 8.9–10.3)
Chloride: 106 mmol/L (ref 98–111)
Creatinine, Ser: 2.94 mg/dL — ABNORMAL HIGH (ref 0.61–1.24)
GFR, Estimated: 28 mL/min — ABNORMAL LOW (ref >60)
Glucose, Bld: 101 mg/dL — ABNORMAL HIGH (ref 70–99)
Potassium: 5.1 mmol/L (ref 3.5–5.1)
Sodium: 140 mmol/L (ref 135–145)
Total Bilirubin: 0.3 mg/dL (ref 0.3–1.2)
Total Protein: 6.2 g/dL — ABNORMAL LOW (ref 6.5–8.1)

## 2023-02-23 LAB — CBC
HCT: 40.9 % (ref 39.0–52.0)
Hemoglobin: 13.3 g/dL (ref 13.0–17.0)
MCH: 28.1 pg (ref 26.0–34.0)
MCHC: 32.5 g/dL (ref 30.0–36.0)
MCV: 86.5 fL (ref 80.0–100.0)
Platelets: 268 10*3/uL (ref 150–400)
RBC: 4.73 MIL/uL (ref 4.22–5.81)
RDW: 12.4 % (ref 11.5–15.5)
WBC: 11.8 10*3/uL — ABNORMAL HIGH (ref 4.0–10.5)
nRBC: 0 % (ref 0.0–0.2)

## 2023-02-23 LAB — LIPASE, BLOOD: Lipase: 57 U/L — ABNORMAL HIGH (ref 11–51)

## 2023-02-23 MED ORDER — HYDROMORPHONE HCL 1 MG/ML IJ SOLN
0.5000 mg | Freq: Once | INTRAMUSCULAR | Status: AC
  Administered 2023-02-23: 0.5 mg via INTRAVENOUS
  Filled 2023-02-23: qty 1

## 2023-02-23 MED ORDER — METOCLOPRAMIDE HCL 5 MG/ML IJ SOLN
10.0000 mg | Freq: Once | INTRAMUSCULAR | Status: AC
  Administered 2023-02-23: 10 mg via INTRAVENOUS
  Filled 2023-02-23: qty 2

## 2023-02-23 MED ORDER — OXYCODONE-ACETAMINOPHEN 5-325 MG PO TABS
1.0000 | ORAL_TABLET | Freq: Once | ORAL | Status: AC
  Administered 2023-02-23: 1 via ORAL
  Filled 2023-02-23: qty 1

## 2023-02-23 MED ORDER — SODIUM CHLORIDE 0.9 % IV BOLUS
500.0000 mL | Freq: Once | INTRAVENOUS | Status: AC
  Administered 2023-02-23: 500 mL via INTRAVENOUS

## 2023-02-23 NOTE — ED Provider Triage Note (Signed)
Emergency Medicine Provider Triage Evaluation Note  Gary Cox , a 37 y.o. male  was evaluated in triage.  Patient complains of abdominal pain, nausea and vomiting that started this morning.  Denies any change in his diet.  No known sick contacts.  Says that the amount of emesis has made him lightheaded  Review of Systems  Positive:  Negative:   Physical Exam  BP (!) 164/105 (BP Location: Right Arm)   Pulse 74   Temp 97.9 F (36.6 C) (Oral)   Resp 17   Ht  (1.702 m)   Wt 71 kg   SpO2 99%   BMI 24.52 kg/m  Gen:   Awake, no distress   Resp:  Normal effort  MSK:   Moves extremities without difficulty  Other:    Medical Decision Making  Medically screening exam initiated at 4:58 PM.  Appropriate orders placed.  Gary Cox was informed that the remainder of the evaluation will be completed by another provider, this initial triage assessment does not replace that evaluation, and the importance of remaining in the ED until their evaluation is complete.     Saddie Benders, PA-C 02/23/23 1659

## 2023-02-23 NOTE — ED Triage Notes (Signed)
The pt is c/o abd pain dizziness and nausea and vomiting since this am

## 2023-02-23 NOTE — Discharge Instructions (Addendum)
Follow up with your Physician for recheck.  Return if any problems.  

## 2023-02-23 NOTE — ED Provider Notes (Signed)
[place] EMERGENCY DEPARTMENT AT [facility] Provider Note   CSN: [id number] Arrival date & time: 02/23/23  1615     History  Chief Complaint  Patient presents with   Abdominal Pain    Gary Cox is a 37 y.o. male.  Patient complains of abdominal pain nausea and vomiting.  Patient reports he has a past medical history of kidney problems.  He is s being followed by nephrology at [facility].  Patient has an IgA nephropathy.  He has been evaluated by the transplant clinic.  They are watching his GFR.  The history is provided by the patient. No language interpreter was used.  Abdominal Pain Pain location:  Generalized Pain quality: bloating   Pain severity:  Moderate Timing:  Constant Progression:  Worsening      Home Medications Prior to Admission medications   Medication Sig Start Date End Date Taking? Authorizing Provider  dapagliflozin propanediol (FARXIGA) 10 MG TABS tablet Take 10 mg by mouth in the morning.    [provider]  lisinopril (ZESTRIL) 10 MG tablet Take 10 mg by mouth at bedtime.    [provider]  LOKELMA 5 g packet Take 1 packet by mouth daily before breakfast. 10/15/22   [provider]  mycophenolate (CELLCEPT) 500 MG tablet Take 500 mg by mouth 2 (two) times daily. 10/15/22   [provider]      Allergies    Patient has no known allergies.    Review of Systems   Review of Systems  Gastrointestinal:  Positive for abdominal pain.  All other systems reviewed and are negative.   Physical Exam Updated Vital Signs BP (!) 169/119 (BP Location: Right Arm)   Pulse 75   Temp 98.2 F (36.8 C) (Oral)   Resp 16   Ht  (1.702 m)   Wt 71 kg   SpO2 100%   BMI 24.52 kg/m  Physical Exam Vitals and nursing note reviewed.  Constitutional:      Appearance: He is well-developed.  HENT:     Head: Normocephalic.  Eyes:     Extraocular Movements: Extraocular movements intact.  Cardiovascular:      Rate and Rhythm: Normal rate and regular rhythm.  Pulmonary:     Effort: Pulmonary effort is normal.  Abdominal:     General: Abdomen is flat. There is no distension.     Palpations: Abdomen is soft.     Tenderness: There is generalized abdominal tenderness.  Musculoskeletal:        General: Normal range of motion.     Cervical back: Normal range of motion.  Skin:    General: Skin is warm.  Neurological:     Mental Status: He is alert and oriented to person, place, and time.     ED Results / Procedures / Treatments   Labs (all labs ordered are listed, but only abnormal results are displayed) Labs Reviewed  LIPASE, BLOOD - Abnormal; Notable for the following components:      Result Value   Lipase 57 (*)    All other components within normal limits  COMPREHENSIVE METABOLIC PANEL - Abnormal; Notable for the following components:   Glucose, Bld 101 (*)    BUN 37 (*)    Creatinine, Ser 2.94 (*)    Total Protein 6.2 (*)    Albumin 3.1 (*)    GFR, Estimated 28 (*)    All other components within normal limits  CBC - Abnormal; Notable for the following  components:   WBC 11.8 (*)    All other components within normal limits  URINALYSIS, ROUTINE W REFLEX MICROSCOPIC - Abnormal; Notable for the following components:   Color, Urine STRAW (*)    Glucose, UA 150 (*)    Hgb urine dipstick MODERATE (*)    Protein, ur >=300 (*)    All other components within normal limits    EKG None  Radiology CT ABDOMEN PELVIS WO CONTRAST  Result Date: 02/23/2023 CLINICAL DATA:  Abdominal pain, acute, non localized. Dizziness. Nausea and vomiting beginning this morning. EXAM: CT ABDOMEN AND PELVIS WITHOUT CONTRAST TECHNIQUE: Multidetector CT imaging of the abdomen and pelvis was performed following the standard protocol without IV contrast. RADIATION DOSE REDUCTION: This exam was performed according to the departmental dose-optimization program which includes automated exposure control, adjustment  of the mA and/or kV according to patient size and/or use of iterative reconstruction technique. COMPARISON:  CT renal stone study 08/06/2017 FINDINGS: Lower chest: The lung bases are clear without focal nodule, mass, or airspace disease. Heart size is normal. No significant pleural or pericardial effusion is present. Hepatobiliary: No focal liver abnormality is seen. No gallstones, gallbladder wall thickening, or biliary dilatation. Pancreas: Unremarkable. No pancreatic ductal dilatation or surrounding inflammatory changes. Spleen: Normal in size without focal abnormality. Adrenals/Urinary Tract: Adrenal glands are normal bilaterally. Three low-density lesions are present in the right kidney, new since the prior exam. Two are present at the midportion of the kidney measuring 11 and 7 mm actively. More inferiorly and anterior exophytic lesion measures 11 mm. A less well-defined area of hypoattenuation is present near the upper pole of the left kidney measuring up to 17 mm. No stone or obstruction is present in either kidney. Ureters are within normal limits. The urinary bladder is normal. Stomach/Bowel: The stomach and duodenum are within normal limits. Small bowel is unremarkable. The terminal ileum is within normal limits. The appendix is visualized and normal. The ascending and transverse colon are normal. The descending and sigmoid colon are normal. Vascular/Lymphatic: No significant vascular findings are present. No enlarged abdominal or pelvic lymph nodes. Reproductive: Prostate is unremarkable. Seminal vesicles are within normal limits. Other: No abdominal wall hernia or abnormality. No abdominopelvic ascites. Musculoskeletal: The vertebral body heights and alignment are normal. No focal osseous lesions are present. IMPRESSION: 1. No acute or focal lesion to explain the patient's symptoms. 2. Three low-density lesions in the right kidney are new since the prior exam. A less well-defined and somewhat  heterogeneous hypodensity is present near the upper pole of the left kidney. These lesions are incompletely characterized. Recommend renal ultrasound for further evaluation. Electronically Signed   By: Marin Roberts M.D.   On: 02/23/2023 18:57    Procedures Procedures    Medications Ordered in ED Medications  HYDROmorphone (DILAUDID) injection 0.5 mg (has no administration in time range)  metoCLOPramide (REGLAN) injection 10 mg (has no administration in time range)  oxyCODONE-acetaminophen (PERCOCET/ROXICET) 5-325 MG per tablet 1 tablet (1 tablet Oral Given 02/23/23 2041)    ED Course/ Medical Decision Making/ A&P                             Medical Decision Making Patient complains of abdominal pain nausea and vomiting  Amount and/or Complexity of Data Reviewed External Data Reviewed: notes.    Details: Primary care notes reviewed Nephrology note reviewed Labs: ordered. Decision-making details documented in ED Course.    Details: Labs  ordered reviewed and interpreted UA shows large blood.  Creatinine is 2.94.  BUN is 37 patient has a white blood cell count of 11.8 Radiology: ordered and independent interpretation performed. Decision-making details documented in ED Course.    Details: CT abdomen shows no acute focal lesion to explain symptoms radiology reports there are 3 low-density lesions to right kidney since prior exam.  Radiologist recommended renal ultrasound.  Risk Prescription drug management. Risk Details: Pt reports relief of nausea and vomiting from pain medication and reglan.             Final Clinical Impression(s) / ED Diagnoses Final diagnoses:  Generalized abdominal pain  Nausea and vomiting, unspecified vomiting type    Rx / DC Orders ED Discharge Orders     None      An After Visit Summary was printed and given to the patient.    Osie Cheeks 02/23/23 2320    Nira Conn, MD 02/24/23 [phone]

## 2023-04-20 ENCOUNTER — Encounter (HOSPITAL_COMMUNITY): Payer: Self-pay

## 2023-04-20 ENCOUNTER — Other Ambulatory Visit: Payer: Self-pay

## 2023-04-20 ENCOUNTER — Inpatient Hospital Stay (HOSPITAL_COMMUNITY)
Admission: EM | Admit: 2023-04-20 | Discharge: 2023-04-25 | DRG: 305 | Disposition: A | Discharge status: Home / Self Care | Payer: Medicaid Other | Attending: Internal Medicine | Admitting: Internal Medicine

## 2023-04-20 DIAGNOSIS — E875 Hyperkalemia: Secondary | ICD-10-CM | POA: Diagnosis present

## 2023-04-20 DIAGNOSIS — T464X5A Adverse effect of angiotensin-converting-enzyme inhibitors, initial encounter: Secondary | ICD-10-CM | POA: Diagnosis present

## 2023-04-20 DIAGNOSIS — Z8249 Family history of ischemic heart disease and other diseases of the circulatory system: Secondary | ICD-10-CM | POA: Diagnosis not present

## 2023-04-20 DIAGNOSIS — F17200 Nicotine dependence, unspecified, uncomplicated: Secondary | ICD-10-CM | POA: Diagnosis present

## 2023-04-20 DIAGNOSIS — E8809 Other disorders of plasma-protein metabolism, not elsewhere classified: Secondary | ICD-10-CM | POA: Diagnosis present

## 2023-04-20 DIAGNOSIS — I160 Hypertensive urgency: Secondary | ICD-10-CM | POA: Diagnosis present

## 2023-04-20 DIAGNOSIS — G40009 Localization-related (focal) (partial) idiopathic epilepsy and epileptic syndromes with seizures of localized onset, not intractable, without status epilepticus: Secondary | ICD-10-CM | POA: Diagnosis present

## 2023-04-20 DIAGNOSIS — N02B9 Other recurrent and persistent immunoglobulin A nephropathy: Secondary | ICD-10-CM | POA: Diagnosis present

## 2023-04-20 DIAGNOSIS — R519 Headache, unspecified: Secondary | ICD-10-CM | POA: Diagnosis present

## 2023-04-20 DIAGNOSIS — D84821 Immunodeficiency due to drugs: Secondary | ICD-10-CM | POA: Diagnosis present

## 2023-04-20 DIAGNOSIS — Z87891 Personal history of nicotine dependence: Secondary | ICD-10-CM

## 2023-04-20 DIAGNOSIS — Z79899 Other long term (current) drug therapy: Secondary | ICD-10-CM

## 2023-04-20 DIAGNOSIS — I161 Hypertensive emergency: Principal | ICD-10-CM | POA: Diagnosis present

## 2023-04-20 DIAGNOSIS — K5900 Constipation, unspecified: Secondary | ICD-10-CM | POA: Diagnosis present

## 2023-04-20 DIAGNOSIS — I129 Hypertensive chronic kidney disease with stage 1 through stage 4 chronic kidney disease, or unspecified chronic kidney disease: Secondary | ICD-10-CM | POA: Diagnosis present

## 2023-04-20 DIAGNOSIS — I959 Hypotension, unspecified: Secondary | ICD-10-CM | POA: Diagnosis not present

## 2023-04-20 DIAGNOSIS — N184 Chronic kidney disease, stage 4 (severe): Secondary | ICD-10-CM | POA: Diagnosis present

## 2023-04-20 DIAGNOSIS — R569 Unspecified convulsions: Secondary | ICD-10-CM | POA: Diagnosis not present

## 2023-04-20 DIAGNOSIS — N179 Acute kidney failure, unspecified: Secondary | ICD-10-CM | POA: Diagnosis present

## 2023-04-20 DIAGNOSIS — Z87820 Personal history of traumatic brain injury: Secondary | ICD-10-CM | POA: Diagnosis not present

## 2023-04-20 DIAGNOSIS — Z79624 Long term (current) use of inhibitors of nucleotide synthesis: Secondary | ICD-10-CM

## 2023-04-20 DIAGNOSIS — R008 Other abnormalities of heart beat: Secondary | ICD-10-CM | POA: Diagnosis not present

## 2023-04-20 LAB — COMPREHENSIVE METABOLIC PANEL
ALT: 17 U/L (ref 0–44)
AST: 21 U/L (ref 15–41)
Albumin: 2.9 g/dL — ABNORMAL LOW (ref 3.5–5.0)
Alkaline Phosphatase: 43 U/L (ref 38–126)
Anion gap: 8 (ref 5–15)
BUN: 40 mg/dL — ABNORMAL HIGH (ref 6–20)
CO2: 23 mmol/L (ref 22–32)
Calcium: 8.4 mg/dL — ABNORMAL LOW (ref 8.9–10.3)
Chloride: 107 mmol/L (ref 98–111)
Creatinine, Ser: 3.41 mg/dL — ABNORMAL HIGH (ref 0.61–1.24)
GFR, Estimated: 23 mL/min — ABNORMAL LOW (ref >60)
Glucose, Bld: 89 mg/dL (ref 70–99)
Potassium: 4.2 mmol/L (ref 3.5–5.1)
Sodium: 138 mmol/L (ref 135–145)
Total Bilirubin: 0.7 mg/dL (ref 0.3–1.2)
Total Protein: 6.2 g/dL — ABNORMAL LOW (ref 6.5–8.1)

## 2023-04-20 LAB — CBC WITH DIFFERENTIAL/PLATELET
Abs Immature Granulocytes: 0.04 10*3/uL (ref 0.00–0.07)
Basophils Absolute: 0 10*3/uL (ref 0.0–0.1)
Basophils Relative: 0 %
Eosinophils Absolute: 0.1 10*3/uL (ref 0.0–0.5)
Eosinophils Relative: 1 %
HCT: 37.9 % — ABNORMAL LOW (ref 39.0–52.0)
Hemoglobin: 12.8 g/dL — ABNORMAL LOW (ref 13.0–17.0)
Immature Granulocytes: 0 %
Lymphocytes Relative: 29 %
Lymphs Abs: 2.7 10*3/uL (ref 0.7–4.0)
MCH: 28.1 pg (ref 26.0–34.0)
MCHC: 33.8 g/dL (ref 30.0–36.0)
MCV: 83.1 fL (ref 80.0–100.0)
Monocytes Absolute: 0.5 10*3/uL (ref 0.1–1.0)
Monocytes Relative: 6 %
Neutro Abs: 6 10*3/uL (ref 1.7–7.7)
Neutrophils Relative %: 64 %
Platelets: 257 10*3/uL (ref 150–400)
RBC: 4.56 MIL/uL (ref 4.22–5.81)
RDW: 12.5 % (ref 11.5–15.5)
WBC: 9.3 10*3/uL (ref 4.0–10.5)
nRBC: 0 % (ref 0.0–0.2)

## 2023-04-20 LAB — TROPONIN I (HIGH SENSITIVITY): Troponin I (High Sensitivity): 5 ng/L (ref <18)

## 2023-04-20 MED ORDER — HYDRALAZINE HCL 20 MG/ML IJ SOLN
10.0000 mg | Freq: Once | INTRAMUSCULAR | Status: AC
  Administered 2023-04-20: 10 mg via INTRAVENOUS
  Filled 2023-04-20: qty 1

## 2023-04-20 MED ORDER — PROCHLORPERAZINE EDISYLATE 10 MG/2ML IJ SOLN
10.0000 mg | Freq: Once | INTRAMUSCULAR | Status: AC
  Administered 2023-04-20: 10 mg via INTRAVENOUS
  Filled 2023-04-20: qty 2

## 2023-04-20 MED ORDER — LACTATED RINGERS IV BOLUS
500.0000 mL | Freq: Once | INTRAVENOUS | Status: AC
  Administered 2023-04-20: 500 mL via INTRAVENOUS

## 2023-04-20 MED ORDER — MORPHINE SULFATE (PF) 2 MG/ML IV SOLN
2.0000 mg | Freq: Once | INTRAVENOUS | Status: AC
  Administered 2023-04-20: 2 mg via INTRAVENOUS
  Filled 2023-04-20: qty 1

## 2023-04-20 MED ORDER — DIPHENHYDRAMINE HCL 50 MG/ML IJ SOLN
25.0000 mg | Freq: Once | INTRAMUSCULAR | Status: AC
  Administered 2023-04-20: 25 mg via INTRAVENOUS
  Filled 2023-04-20: qty 1

## 2023-04-20 MED ORDER — ONDANSETRON HCL 4 MG/2ML IJ SOLN
4.0000 mg | Freq: Once | INTRAMUSCULAR | Status: AC
  Administered 2023-04-20: 4 mg via INTRAVENOUS
  Filled 2023-04-20: qty 2

## 2023-04-20 NOTE — H&P (Signed)
History and Physical    PatientRyn Cox ZOX:[id number] DOB: 1986-02-22 DOA: 04/20/2023 DOS: the patient was seen and examined on 04/20/2023 PCP: Massie Maroon, FNP  Patient coming from: Home  Chief Complaint:  Chief Complaint  Patient presents with   Hypertension   HPI: Gary Cox is a 37 y.o. 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  Patient was apparently at a family gathering today when he started having the headaches.  Blood pressure at the time check was 193/123.  He has been having the headache on and off for the last 4 days.  Also constipation during the..  Patient came to the ER where he was seen and evaluated.  Appears to have blood pressure of 191/125 on arrival.  Also pulse of 103.  He appears to have hypertensive urgency.  Renal function also has gotten worse creatinine now 3.41 up from 2.942 months ago.  Nephrology was consulted and will see patient in the hospital.  He is being admitted with hypertensive urgency and worsening renal function.  Also ongoing headache.  Head CT without contrast currently pending to workup the headache.  Review of Systems: As mentioned in the history of present illness. All other systems reviewed and are negative. Past Medical History:  Diagnosis Date   IgA nephropathy 08/26/2017   Renal disorder    UTI (urinary tract infection)    Past Surgical History:  Procedure Laterality Date   CYST REMOVAL NECK     Social History:  reports that he quit smoking about 7 years ago. His smoking use included cigarettes. He has a 3.00 pack-year smoking history. He has never used smokeless tobacco. He reports that he does not drink alcohol and does not use drugs.  No Known Allergies  Family History  Problem Relation Age of Onset   Hypertension Mother    Hypertension Father      Prior to Admission medications   Medication Sig Start Date End Date Taking? Authorizing Provider  dapagliflozin propanediol (FARXIGA) 10 MG TABS tablet Take 10 mg by mouth in the morning.    [provider]  lisinopril (ZESTRIL) 10 MG tablet Take 10 mg by mouth at bedtime.    [provider]  LOKELMA 5 g packet Take 1 packet by mouth daily before breakfast. 10/15/22   [provider]  mycophenolate (CELLCEPT) 500 MG tablet Take 500 mg by mouth 2 (two) times daily. 10/15/22   [provider]    Physical Exam: Vitals:   04/20/23 2215 04/20/23 2230 04/20/23 2245 04/20/23 2300  BP: (!) 169/118 (!) 174/112 (!) 172/109 (!) 176/109  Pulse: 80 95 93 (!) 103  Resp: 17 16 14 18   Temp:      TempSrc:      SpO2: 98% 99% 98% 99%  Weight:      Height:       Constitutional: Acutely ill looking, NAD, calm, comfortable Eyes: PERRL, lids and conjunctivae normal ENMT: Mucous membranes are moist. Posterior pharynx clear of any exudate or lesions.Normal dentition.  Neck: normal, supple, no masses, no thyromegaly Respiratory: clear to auscultation bilaterally, no wheezing, no crackles. Normal respiratory effort. No accessory muscle use.  Cardiovascular: Sinus tachycardia no murmurs / rubs / gallops. No extremity edema. 2+ pedal pulses. No carotid bruits.  Abdomen: no tenderness, no masses palpated. No hepatosplenomegaly. Bowel sounds positive.  Musculoskeletal: Good range of motion,  no joint swelling or tenderness, Skin: no rashes, lesions, ulcers. No induration Neurologic: CN 2-12 grossly intact. Sensation intact, DTR normal. Strength 5/5 in all 4.  Psychiatric: Normal judgment and insight. Alert and oriented x 3. Normal mood  Data Reviewed:  Sodium is 138, BUN 40 creatinine 3.41 and calcium 8.4.  Albumin 2.9.  Total protein 6.2.  Hemoglobin 12.8.  Urinalysis showed straw urine.  CT abdomen pelvis showed no acute findings.  There is 3 low-density lesion in the  right kidney which are new since prior exam.  Assessment and Plan:  #1 hypertensive urgency: Secondary to renal disease.  Patient will be admitted.  Will try IV hydralazine.  Nephrology will advise further.  Beta-blockers will be other options.  Especially with his tachycardia.  #2 acute on chronic kidney disease stage IV: Worsening renal function may be related to ongoing hypertension with IgA nephropathy.  Will defer to nephrology.  #3 IgA nephropathy: Patient had recent renal biopsy in April.  He is being worked up by nephrology in the outpatient setting.  Will defer management to them.  #4 persistent headache: Most likely secondary to uncontrolled hypertension.  Head CT without contrast ordered to rule out any bleed.  #5 history of tobacco use: Tobacco cessation counseling.  Offered nicotine patch.    Advance Care Planning:   Code Status: Full Code   Consults: Nephrology  Family Communication: No family at bedside  Severity of Illness: The appropriate patient status for this patient is INPATIENT. Inpatient status is judged to be reasonable and necessary in order to provide the required intensity of service to ensure the patient's safety. The patient's presenting symptoms, physical exam findings, and initial radiographic and laboratory data in the context of their chronic comorbidities is felt to place them at high risk for further clinical deterioration. Furthermore, it is not anticipated that the patient will be medically stable for discharge from the hospital within 2 midnights of admission.   * I certify that at the point of admission it is my clinical judgment that the patient will require inpatient hospital care spanning beyond 2 midnights from the point of admission due to high intensity of service, high risk for further deterioration and high frequency of surveillance required.*  AuthorLonia Blood, MD 04/20/2023 11:13 PM  For on call review [email or web address].
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
## 2023-04-20 NOTE — ED Triage Notes (Signed)
Patient arrives POV c/o high blood pressure. Patient has stage 4 kidney failure failure; patient was at a family gathering today when BP was 193/123. Patient is taking Farxiga, amlodipine, and lokelma. Patient also reports headache and constipation x6 days.

## 2023-04-21 ENCOUNTER — Inpatient Hospital Stay (HOSPITAL_COMMUNITY): Payer: Medicaid Other

## 2023-04-21 ENCOUNTER — Other Ambulatory Visit (HOSPITAL_COMMUNITY): Payer: Medicaid Other

## 2023-04-21 DIAGNOSIS — I160 Hypertensive urgency: Secondary | ICD-10-CM | POA: Diagnosis not present

## 2023-04-21 LAB — CBC
HCT: 36.5 % — ABNORMAL LOW (ref 39.0–52.0)
HCT: 34.8 % — ABNORMAL LOW (ref 39.0–52.0)
Hemoglobin: 12.6 g/dL — ABNORMAL LOW (ref 13.0–17.0)
Hemoglobin: 12.2 g/dL — ABNORMAL LOW (ref 13.0–17.0)
MCH: 29 pg (ref 26.0–34.0)
MCH: 29.4 pg (ref 26.0–34.0)
MCHC: 34.5 g/dL (ref 30.0–36.0)
MCHC: 35.1 g/dL (ref 30.0–36.0)
MCV: 84.1 fL (ref 80.0–100.0)
MCV: 83.9 fL (ref 80.0–100.0)
Platelets: 244 10*3/uL (ref 150–400)
Platelets: 239 10*3/uL (ref 150–400)
RBC: 4.34 MIL/uL (ref 4.22–5.81)
RBC: 4.15 MIL/uL — ABNORMAL LOW (ref 4.22–5.81)
RDW: 12.5 % (ref 11.5–15.5)
RDW: 12.6 % (ref 11.5–15.5)
WBC: 9 10*3/uL (ref 4.0–10.5)
WBC: 8.4 10*3/uL (ref 4.0–10.5)
nRBC: 0 % (ref 0.0–0.2)
nRBC: 0 % (ref 0.0–0.2)

## 2023-04-21 LAB — URINALYSIS, ROUTINE W REFLEX MICROSCOPIC
Bacteria, UA: NONE SEEN
Bilirubin Urine: NEGATIVE
Glucose, UA: >=500 mg/dL — AB
Ketones, ur: NEGATIVE mg/dL
Leukocytes,Ua: NEGATIVE
Nitrite: NEGATIVE
Protein, ur: >=300 mg/dL — AB
Specific Gravity, Urine: 1.008 (ref 1.005–1.030)
pH: 5 (ref 5.0–8.0)

## 2023-04-21 LAB — RENAL FUNCTION PANEL
Albumin: 2.6 g/dL — ABNORMAL LOW (ref 3.5–5.0)
Anion gap: 12 (ref 5–15)
BUN: 37 mg/dL — ABNORMAL HIGH (ref 6–20)
CO2: 22 mmol/L (ref 22–32)
Calcium: 8.2 mg/dL — ABNORMAL LOW (ref 8.9–10.3)
Chloride: 107 mmol/L (ref 98–111)
Creatinine, Ser: 3.23 mg/dL — ABNORMAL HIGH (ref 0.61–1.24)
GFR, Estimated: 25 mL/min — ABNORMAL LOW (ref >60)
Glucose, Bld: 115 mg/dL — ABNORMAL HIGH (ref 70–99)
Phosphorus: 4.4 mg/dL (ref 2.5–4.6)
Potassium: 3.7 mmol/L (ref 3.5–5.1)
Sodium: 141 mmol/L (ref 135–145)

## 2023-04-21 LAB — CREATININE, SERUM
Creatinine, Ser: 3.22 mg/dL — ABNORMAL HIGH (ref 0.61–1.24)
GFR, Estimated: 25 mL/min — ABNORMAL LOW (ref >60)

## 2023-04-21 LAB — TROPONIN I (HIGH SENSITIVITY): Troponin I (High Sensitivity): 7 ng/L (ref <18)

## 2023-04-21 LAB — TSH: TSH: 2.177 u[IU]/mL (ref 0.350–4.500)

## 2023-04-21 LAB — HIV ANTIBODY (ROUTINE TESTING W REFLEX): HIV Screen 4th Generation wRfx: NONREACTIVE

## 2023-04-21 MED ORDER — CARVEDILOL 6.25 MG PO TABS
6.2500 mg | ORAL_TABLET | Freq: Two times a day (BID) | ORAL | Status: DC
  Administered 2023-04-21 – 2023-04-22 (×2): 6.25 mg via ORAL
  Filled 2023-04-21 (×2): qty 1

## 2023-04-21 MED ORDER — CAMPHOR-MENTHOL 0.5-0.5 % EX LOTN: 1.0000 | TOPICAL_LOTION | Freq: Three times a day (TID) | CUTANEOUS | Status: DC | PRN

## 2023-04-21 MED ORDER — DIPHENHYDRAMINE HCL 50 MG/ML IJ SOLN
12.5000 mg | Freq: Once | INTRAMUSCULAR | Status: AC
  Administered 2023-04-21: 12.5 mg via INTRAVENOUS
  Filled 2023-04-21: qty 1

## 2023-04-21 MED ORDER — SORBITOL 70 % SOLN
30.0000 mL | Status: DC | PRN
  Administered 2023-04-21: 30 mL via ORAL
  Filled 2023-04-21: qty 30

## 2023-04-21 MED ORDER — DAPAGLIFLOZIN PROPANEDIOL 10 MG PO TABS
10.0000 mg | ORAL_TABLET | Freq: Every day | ORAL | Status: DC
  Administered 2023-04-21: 10 mg via ORAL
  Filled 2023-04-21: qty 1

## 2023-04-21 MED ORDER — ACETAMINOPHEN 650 MG RE SUPP: 650.0000 mg | Freq: Four times a day (QID) | RECTAL | Status: DC | PRN

## 2023-04-21 MED ORDER — MORPHINE SULFATE (PF) 2 MG/ML IV SOLN
2.0000 mg | INTRAVENOUS | Status: DC | PRN
  Administered 2023-04-21 – 2023-04-22 (×2): 2 mg via INTRAVENOUS
  Filled 2023-04-21 (×2): qty 1

## 2023-04-21 MED ORDER — HYDROXYZINE HCL 25 MG PO TABS
25.0000 mg | ORAL_TABLET | Freq: Three times a day (TID) | ORAL | Status: DC | PRN
  Administered 2023-04-21: 25 mg via ORAL
  Filled 2023-04-21: qty 1

## 2023-04-21 MED ORDER — LACTATED RINGERS IV SOLN: INTRAVENOUS | Status: DC

## 2023-04-21 MED ORDER — ACETAMINOPHEN 325 MG PO TABS: 650.0000 mg | ORAL_TABLET | Freq: Four times a day (QID) | ORAL | Status: DC | PRN

## 2023-04-21 MED ORDER — PROCHLORPERAZINE EDISYLATE 10 MG/2ML IJ SOLN
10.0000 mg | Freq: Once | INTRAMUSCULAR | Status: AC
  Administered 2023-04-21: 10 mg via INTRAVENOUS
  Filled 2023-04-21: qty 2

## 2023-04-21 MED ORDER — NEPRO/CARBSTEADY PO LIQD: 237.0000 mL | Freq: Three times a day (TID) | ORAL | Status: DC | PRN

## 2023-04-21 MED ORDER — AMLODIPINE BESYLATE 10 MG PO TABS
10.0000 mg | ORAL_TABLET | Freq: Every day | ORAL | Status: DC
  Administered 2023-04-21 – 2023-04-23 (×3): 10 mg via ORAL
  Filled 2023-04-21 (×3): qty 1

## 2023-04-21 MED ORDER — ORAL CARE MOUTH RINSE: 15.0000 mL | OROMUCOSAL | Status: DC | PRN

## 2023-04-21 MED ORDER — ONDANSETRON HCL 4 MG/2ML IJ SOLN: 4.0000 mg | Freq: Four times a day (QID) | INTRAMUSCULAR | Status: DC | PRN

## 2023-04-21 MED ORDER — HYDRALAZINE HCL 25 MG PO TABS
25.0000 mg | ORAL_TABLET | Freq: Three times a day (TID) | ORAL | Status: DC
  Administered 2023-04-21 – 2023-04-22 (×3): 25 mg via ORAL
  Filled 2023-04-21 (×3): qty 1

## 2023-04-21 MED ORDER — CALCIUM CARBONATE ANTACID 1250 MG/5ML PO SUSP: 500.0000 mg | Freq: Four times a day (QID) | ORAL | Status: DC | PRN

## 2023-04-21 MED ORDER — SODIUM ZIRCONIUM CYCLOSILICATE 5 G PO PACK: 5.0000 g | PACK | Freq: Every day | ORAL | Status: DC

## 2023-04-21 MED ORDER — ACETAMINOPHEN 325 MG PO TABS
650.0000 mg | ORAL_TABLET | Freq: Four times a day (QID) | ORAL | Status: DC | PRN
  Administered 2023-04-21 (×2): 650 mg via ORAL
  Filled 2023-04-21 (×2): qty 2

## 2023-04-21 MED ORDER — ONDANSETRON HCL 4 MG PO TABS: 4.0000 mg | ORAL_TABLET | Freq: Four times a day (QID) | ORAL | Status: DC | PRN

## 2023-04-21 MED ORDER — LABETALOL HCL 5 MG/ML IV SOLN
10.0000 mg | INTRAVENOUS | Status: DC | PRN
  Filled 2023-04-21 (×2): qty 4

## 2023-04-21 MED ORDER — DOCUSATE SODIUM 283 MG RE ENEM
1.0000 | ENEMA | RECTAL | Status: DC | PRN
  Administered 2023-04-23: 283 mg via RECTAL
  Filled 2023-04-21 (×2): qty 1

## 2023-04-21 MED ORDER — ACETAMINOPHEN 500 MG PO TABS
1000.0000 mg | ORAL_TABLET | Freq: Three times a day (TID) | ORAL | Status: AC
  Administered 2023-04-21 – 2023-04-24 (×7): 1000 mg via ORAL
  Filled 2023-04-21 (×8): qty 2

## 2023-04-21 MED ORDER — HEPARIN SODIUM (PORCINE) 5000 UNIT/ML IJ SOLN
5000.0000 [IU] | Freq: Three times a day (TID) | INTRAMUSCULAR | Status: DC
  Administered 2023-04-21 – 2023-04-25 (×11): 5000 [IU] via SUBCUTANEOUS
  Filled 2023-04-21 (×11): qty 1

## 2023-04-21 MED ORDER — HYDRALAZINE HCL 20 MG/ML IJ SOLN
10.0000 mg | Freq: Four times a day (QID) | INTRAMUSCULAR | Status: DC | PRN
  Administered 2023-04-21 – 2023-04-22 (×2): 10 mg via INTRAVENOUS
  Filled 2023-04-21 (×3): qty 1

## 2023-04-21 MED ORDER — HYDRALAZINE HCL 20 MG/ML IJ SOLN: 10.0000 mg | Freq: Four times a day (QID) | INTRAMUSCULAR | Status: DC | PRN

## 2023-04-21 MED ORDER — ZOLPIDEM TARTRATE 5 MG PO TABS: 5.0000 mg | ORAL_TABLET | Freq: Every evening | ORAL | Status: DC | PRN

## 2023-04-21 MED ORDER — AMLODIPINE BESYLATE 5 MG PO TABS: 5.0000 mg | ORAL_TABLET | Freq: Every day | ORAL | Status: DC
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
## 2023-04-21 NOTE — Consult Note (Signed)
[facility] Renal Consultation Note  Requesting MD: Lacretia Nicks, MD Indication for Consultation:  CKD and IgA nephropathy   Chief complaint: headache  HPI:  Gary Cox is a 37 y.o. male with a history including CKD, IgA nephropathy, HTN, recurrent UTI, and tobacco abuse who presented to the hospital with considerable headache.  He was found to be markedly hypertensive.  Blood pressure on initial check was 193/123 per charting.  Nephrology is consulted for assistance with management of CKD and hypertension.  He has been on [place] and until recently had been on CellCept and lisinopril 10 mg daily.  He was recently on Lokelma.  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  Note SGLT2i and ACEi, started around 2022.  He was previously on prednisone from 09/04/21 - 03/2022 per charting.  Later he got another biopsy on 11/07/22 which demonstrated severe chronic injury with no glomerular activity.  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 No mesangial expansion or hypercellularity and no endocapillary hypercellularity, fibrinoid necrosis, wire loops hyaline thrombi, or cellular crescent formation.  He saw [facility] in 12/2022 and tarpeyo was recommended.  On 04/09/2023 he had labs demonstrating a creatinine of 3.67 and a potassium of 6.3.  In response to that, his lisinopril was stopped per charting.  He had not been using Lokelma and he picked up samples.  Repeat labs done on 6/7 had a creatinine of 3.37 and a potassium of 5.5.  Lokelma dosing was increased to 10 g daily.  On his last outpatient labs: 04/18/23 - Cr 3.55, BUN 46, eGFR 22, K 6.1, bicarb 24.  Note his amlodipine was increased to 10 mg daily after 6/14 visit and per charting next step was to be coreg.  Per the 6/14 labs with hyperkalemia he was to be started  on chlorthalidone (however hadn't actually started it yet as just got message today).   Note that in the past he had history of acute HTN/emergency and seizure (vs. Syncope per report?) 03/[facility] at [facility].   I spoke with the patient and his wife joined via speakerphone.  She provides the bulk of the history.  I offered to call an Arabic interpreter and he declined; he said that he was comfortable speaking with me in [place].  His headache is getting better.  His BP has gotten better here.  They had been called by [facility] to start chlorthalidone but just got this message today.  His wife is pregnant and due any day now.  His wife would feel most comfortable if he were to stay tonight because we are starting a new medication.  There is family who can take her to the hospital if needed.  He reports constipation as his only other concern.     Creat  Date/Time Value Ref Range Status  11/27/2016 09:22 AM 0.90 0.60 - 1.35 mg/dL Final  16/08/9603 54:09 AM 0.90 0.60 - 1.35 mg/dL Final  81/19/1478 29:56 PM 0.95 0.60 - 1.35 mg/dL Final  21/30/8657 84:69 PM 0.84 0.60 - 1.35 mg/dL Final   Creatinine, Ser  Date/Time Value Ref Range Status  04/21/2023 02:45 AM 3.23 (H) 0.61 - 1.24 mg/dL Final  62/95/2841 32:44 AM 3.22 (H) 0.61 - 1.24 mg/dL Final  11/06/7251 66:44 PM 3.41 (H) 0.61 - 1.24 mg/dL Final  03/47/4259 56:38 PM 2.94 (H)  0.61 - 1.24 mg/dL Final  16/08/9603 54:09 AM 1.02 0.61 - 1.24 mg/dL Final  81/19/1478 29:56 PM 1.10 0.61 - 1.24 mg/dL Final  21/30/8657 84:69 PM 1.12 0.61 - 1.24 mg/dL Final  62/95/2841 32:44 PM 1.11 0.61 - 1.24 mg/dL Final  11/06/7251 66:44 PM 1.00 0.61 - 1.24 mg/dL Final  03/47/4259 56:38 PM 1.25 (H) 0.61 - 1.24 mg/dL Final  75/64/3329 51:88 AM 0.91 0.61 - 1.24 mg/dL Final  41/66/0630 16:01 AM 1.03 0.61 - 1.24 mg/dL Final  09/32/3557 32:20 AM 1.49 (H) 0.61 - 1.24 mg/dL Final  25/42/7062 37:62 AM 1.01 0.50 - 1.35 mg/dL Final  83/15/1761 60:73 AM 0.97 0.50 - 1.35 mg/dL  Final  71/04/2693 85:46 AM 1.16 0.50 - 1.35 mg/dL Final  27/01/5008 38:18 AM 1.04 0.50 - 1.35 mg/dL Final  29/93/7169 67:89 AM 1.03 0.50 - 1.35 mg/dL Final  38/08/1750 02:58 AM 1.12 0.50 - 1.35 mg/dL Final  52/77/8242 35:36 PM 1.14 0.50 - 1.35 mg/dL Final     PMHx:   Past Medical History:  Diagnosis Date   IgA nephropathy 08/26/2017   Renal disorder    UTI (urinary tract infection)     Past Surgical History:  Procedure Laterality Date   CYST REMOVAL NECK      Family Hx:  Family History  Problem Relation Age of Onset   Hypertension Mother    Hypertension Father     Social History:  reports that he quit smoking about 7 years ago. His smoking use included cigarettes. He has a 3.00 pack-year smoking history. He has never used smokeless tobacco. He reports that he does not drink alcohol and does not use drugs.  Allergies: No Known Allergies  Medications: Prior to Admission medications   Medication Sig Start Date End Date Taking? Authorizing Provider  dapagliflozin propanediol (FARXIGA) 10 MG TABS tablet Take 10 mg by mouth in the morning.    [provider]  LOKELMA 10 g packet Take 1 packet by mouth daily before breakfast. 10/15/22   [provider]  Amlodipine 10 mg daily   I have reviewed the patient's current and reported prior to admission medications.   Labs:     Latest Ref Rng & Units 04/21/2023    2:45 AM 04/21/2023    1:04 AM 04/20/2023   10:08 PM  BMP  Glucose 70 - 99 mg/dL 144   89   BUN 6 - 20 mg/dL 37   40   Creatinine 3.15 - 1.24 mg/dL 4.00  8.67  6.19   Sodium 135 - 145 mmol/L 141   138   Potassium 3.5 - 5.1 mmol/L 3.7   4.2   Chloride 98 - 111 mmol/L 107   107   CO2 22 - 32 mmol/L 22   23   Calcium 8.9 - 10.3 mg/dL 8.2   8.4     Urinalysis    Component Value Date/Time   COLORURINE STRAW (A) 02/23/2023 1649   APPEARANCEUR CLEAR 02/23/2023 1649   LABSPEC 1.011 02/23/2023 1649   PHURINE 5.0 02/23/2023 1649   GLUCOSEU 150 (A)  02/23/2023 1649   HGBUR MODERATE (A) 02/23/2023 1649   BILIRUBINUR NEGATIVE 02/23/2023 1649   BILIRUBINUR negative 02/20/2016 1551   KETONESUR NEGATIVE 02/23/2023 1649   PROTEINUR >=300 (A) 02/23/2023 1649   UROBILINOGEN 0.2 11/27/2016 0927   NITRITE NEGATIVE 02/23/2023 1649   LEUKOCYTESUR NEGATIVE 02/23/2023 1649     ROS:  Pertinent items noted in HPI and remainder of comprehensive ROS otherwise negative.  Physical  Exam: Vitals:   04/21/23 0750 04/21/23 1107  BP: (!) 155/109 (!) 146/96  Pulse: 94 94  Resp: 18 17  Temp: 98.7 F (37.1 C) 98.7 F (37.1 C)  SpO2: 96% 97%     General: adult male in bed in NAD  HEENT: NCAT Eyes: EOMI  Neck: sclera anicteric Heart: supple trachea midline  Lungs: clear to auscultation; normal work of breathing at rest; on room air  Abdomen: soft/nt/nd Extremities: no edema appreciated; no cyanosis or clubbing Skin: no rash on extremities exposed  Neuro: alert and oriented x 3 provides a basic history and follows commands  Psych: no anxiety or agitation    Lab trends: per [facility] and primary neph charting:  Creatinine/proteinuria: 04/2021: 1.6 07/13/21: 2.0 09/04/21: 2.07, ACR 2.6 10/18/21: 1.97, ACR 2500 11/30/21: 1.87 10/11/22: 2.79 10/14/22: 2.97, ACR 2.3  04/09/2023 - Cr 3.67 and a potassium of 6.3.   6/7 - Cr 3.37 and a potassium of 5.5 04/18/23 - Cr 3.55, K 6.1, BUN 46   Assessment/Plan:  # AKI  - Setting of hypertensive emergency and failure of autoregulation   # CKD stage IV with progressive disease - Secondary to IgA nephropathy  - Follows with Dr. Marisue Humble at [facility] and has seen [facility] (Dr. Elson Clan) for additional opinion  - would please choose an alternative to morphine for pain in light of the AKI/CKD  # IgA nephropathy - as an outpatient is on tarpeyo (budesonide) however note is not on formulary here - Per [facility] the degree of scarring demonstrated indicates that immunosuppression would be of minimal benefit - he is now off  of cellcept   # Hypertensive emergency  - Had been on RAAS blockade - this was discontinued recently due to hyperkalemia  - Control is improving - Start coreg 6.25 mg BID  - can continue hydralazine   # Hyperkalemia - resolved  - note he had recurrent hyperkalemia while on low dose RAAS blockade  - has been on daily lokelma outpatient - now off and this can be held for now.  K 3.7.   # Headache - Secondary to hypertensive emergency   Disposition - if labs stable and blood pressure improved tomorrow then he would be acceptable for discharge home tomorrow from a strictly renal standpoint   Estanislado Emms 04/21/2023, 4:08 PM

## 2023-04-21 NOTE — ED Notes (Signed)
ED TO INPATIENT HANDOFF REPORT  ED Nurse Name and Phone #: Ivonne Dianah Pruett RN  [phone]  S Name/Age/Gender Gary Cox 37 y.o. male Room/Bed: 043C/043C  Code Status   Code Status: Full Code  Home/SNF/Other Home Patient oriented to: self, place, time, and situation Is this baseline? Yes   Triage Complete: Triage complete  Chief Complaint Hypertensive urgency [I16.0]  Triage Note Patient arrives POV c/o high blood pressure. Patient has stage 4 kidney failure failure; patient was at a family gathering today when BP was 193/123. Patient is taking Farxiga, amlodipine, and lokelma. Patient also reports headache and constipation x6 days.     Allergies No Known Allergies  Level of Care/Admitting Diagnosis ED Disposition     ED Disposition  Admit   Condition  --   Comment  Hospital Area: [facility] [[id number]]  Level of Care: Telemetry Medical [104]  May admit patient to [facility] or [facility] if equivalent level of care is available:: No  Covid Evaluation: Asymptomatic - no recent exposure (last 10 days) testing not required  Diagnosis: Hypertensive urgency [829562]  Admitting Physician: Rometta Emery [[id number]]  Attending Physician: Rometta Emery [[id number]]  Certification:: I certify this patient will need inpatient services for at least 2 midnights  Estimated Length of Stay: 4          B Medical/Surgery History Past Medical History:  Diagnosis Date   IgA nephropathy 08/26/2017   Renal disorder    UTI (urinary tract infection)    Past Surgical History:  Procedure Laterality Date   CYST REMOVAL NECK       A IV Location/Drains/Wounds Patient Lines/Drains/Airways Status     Active Line/Drains/Airways     Name Placement date Placement time Site Days   Peripheral IV 04/20/23 Anterior;Right Forearm 04/20/23  2213  Forearm  1   Wound / Incision (Open or Dehisced) 08/25/17 Puncture Throat Mid;Anterior 3 CM puncture wound 08/25/17  1621   Throat  2065   Wound / Incision (Open or Dehisced) 08/21/21 Puncture Flank Lateral;Left renal biopsy site 08/21/21  0854  Flank  608   Wound / Incision (Open or Dehisced) 11/07/22 Puncture Flank Left 11/07/22  0847  Flank  165            Intake/Output Last 24 hours No intake or output data in the 24 hours ending 04/21/23 0135  Labs/Imaging Results for orders placed or performed during the hospital encounter of 04/20/23 (from the past 48 hour(s))  Comprehensive metabolic panel     Status: Abnormal   Collection Time: 04/20/23 10:08 PM  Result Value Ref Range   Sodium 138 135 - 145 mmol/L   Potassium 4.2 3.5 - 5.1 mmol/L    Comment: HEMOLYSIS AT THIS LEVEL MAY AFFECT RESULT   Chloride 107 98 - 111 mmol/L   CO2 23 22 - 32 mmol/L   Glucose, Bld 89 70 - 99 mg/dL    Comment: Glucose reference range applies only to samples taken after fasting for at least 8 hours.   BUN 40 (H) 6 - 20 mg/dL   Creatinine, Ser 1.30 (H) 0.61 - 1.24 mg/dL   Calcium 8.4 (L) 8.9 - 10.3 mg/dL   Total Protein 6.2 (L) 6.5 - 8.1 g/dL   Albumin 2.9 (L) 3.5 - 5.0 g/dL   AST 21 15 - 41 U/L    Comment: HEMOLYSIS AT THIS LEVEL MAY AFFECT RESULT   ALT 17 0 - 44 U/L    Comment: HEMOLYSIS AT  THIS LEVEL MAY AFFECT RESULT   Alkaline Phosphatase 43 38 - 126 U/L   Total Bilirubin 0.7 0.3 - 1.2 mg/dL    Comment: HEMOLYSIS AT THIS LEVEL MAY AFFECT RESULT   GFR, Estimated 23 (L) >60 mL/min    Comment: (NOTE) Calculated using the CKD-EPI Creatinine Equation (2021)    Anion gap 8 5 - 15    Comment: Performed at [facility], [address] [place], [place] [address]  CBC with Differential     Status: Abnormal   Collection Time: 04/20/23 10:08 PM  Result Value Ref Range   WBC 9.3 4.0 - 10.5 K/uL   RBC 4.56 4.22 - 5.81 MIL/uL   Hemoglobin 12.8 (L) 13.0 - 17.0 g/dL   HCT 95.2 (L) 84.1 - 32.4 %   MCV 83.1 80.0 - 100.0 fL   MCH 28.1 26.0 - 34.0 pg   MCHC 33.8 30.0 - 36.0 g/dL   RDW 40.1 02.7 - 25.3 %   Platelets  257 150 - 400 K/uL   nRBC 0.0 0.0 - 0.2 %   Neutrophils Relative % 64 %   Neutro Abs 6.0 1.7 - 7.7 K/uL   Lymphocytes Relative 29 %   Lymphs Abs 2.7 0.7 - 4.0 K/uL   Monocytes Relative 6 %   Monocytes Absolute 0.5 0.1 - 1.0 K/uL   Eosinophils Relative 1 %   Eosinophils Absolute 0.1 0.0 - 0.5 K/uL   Basophils Relative 0 %   Basophils Absolute 0.0 0.0 - 0.1 K/uL   Immature Granulocytes 0 %   Abs Immature Granulocytes 0.04 0.00 - 0.07 K/uL    Comment: Performed at [facility], [address] [place], [place] [address]  Troponin I (High Sensitivity)     Status: None   Collection Time: 04/20/23 10:08 PM  Result Value Ref Range   Troponin I (High Sensitivity) 5 <18 ng/L    Comment: (NOTE) Elevated high sensitivity troponin I (hsTnI) values and significant  changes across serial measurements may suggest ACS but many other  chronic and acute conditions are known to elevate hsTnI results.  Refer to the "Links" section for chest pain algorithms and additional  guidance. Performed at [facility], [address] [place], [place] [address]   Troponin I (High Sensitivity)     Status: None   Collection Time: 04/20/23 11:20 PM  Result Value Ref Range   Troponin I (High Sensitivity) 7 <18 ng/L    Comment: (NOTE) Elevated high sensitivity troponin I (hsTnI) values and significant  changes across serial measurements may suggest ACS but many other  chronic and acute conditions are known to elevate hsTnI results.  Refer to the "Links" section for chest pain algorithms and additional  guidance. Performed at [facility], [address] [place], [place] [address]   CBC     Status: Abnormal   Collection Time: 04/21/23  1:04 AM  Result Value Ref Range   WBC 9.0 4.0 - 10.5 K/uL   RBC 4.34 4.22 - 5.81 MIL/uL   Hemoglobin 12.6 (L) 13.0 - 17.0 g/dL   HCT 87.5 (L) 64.3 - 32.9 %   MCV 84.1 80.0 - 100.0 fL   MCH 29.0 26.0 - 34.0 pg   MCHC 34.5 30.0 - 36.0 g/dL   RDW 51.8  84.1 - 66.0 %   Platelets 244 150 - 400 K/uL   nRBC 0.0 0.0 - 0.2 %    Comment: Performed at [facility],  [address], [place], [place] [address]   CT Head Wo Contrast  Result Date: 04/21/2023 CLINICAL DATA:  Headache EXAM: CT HEAD WITHOUT CONTRAST TECHNIQUE: Contiguous axial images were obtained from the base of the skull through the vertex without intravenous contrast. RADIATION DOSE REDUCTION: This exam was performed according to the departmental dose-optimization program which includes automated exposure control, adjustment of the mA and/or kV according to patient size and/or use of iterative reconstruction technique. COMPARISON:  None Available. FINDINGS: Brain: No evidence of acute infarction, hemorrhage, mass, mass effect, or midline shift. No hydrocephalus or extra-axial fluid collection. 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 Vascular: No hyperdense vessel. Skull: Negative for fracture or focal lesion. Sinuses/Orbits: Mucosal thickening in the right maxillary sinus. No acute finding in the orbits. Other: The mastoid air cells are well aerated. IMPRESSION: 1. No acute intracranial process. 2. 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 Electronically Signed   By: Wiliam Ke M.D.   On: 04/21/2023 01:04    Pending Labs Unresulted Labs (From admission, onward)     Start     Ordered   04/21/23 0500  Renal function panel  Tomorrow morning,   R        04/21/23 0017   04/21/23 0500  CBC  Tomorrow morning,   R        04/21/23 0017   04/21/23 0018  HIV Antibody (routine testing w rflx)  (HIV Antibody (Routine testing w reflex) panel)  Once,   R        04/21/23 0017   04/21/23 0018  Creatinine, serum  (heparin)  Once,   R       Comments: Baseline for heparin therapy IF NOT ALREADY DRAWN.    04/21/23  0017            Vitals/Pain Today's Vitals   04/21/23 0000 04/21/23 0015 04/21/23 0100 04/21/23 0130  BP: (!) 164/101 (!) 155/107 (!) 167/109 (!) 168/105  Pulse: 88 94 93 94  Resp: 17 18 17 15   Temp:  98.1 F (36.7 C)    TempSrc:  Oral    SpO2: 97% 97% 97% 98%  Weight:      Height:      PainSc:        Isolation Precautions No active isolations  Medications Medications  dapagliflozin propanediol (FARXIGA) tablet 10 mg (has no administration in time range)  sodium zirconium cyclosilicate (LOKELMA) packet 5 g (has no administration in time range)  acetaminophen (TYLENOL) tablet 650 mg (has no administration in time range)    Or  acetaminophen (TYLENOL) suppository 650 mg (has no administration in time range)  zolpidem (AMBIEN) tablet 5 mg (has no administration in time range)  camphor-menthol (SARNA) lotion 1 Application (has no administration in time range)    And  hydrOXYzine (ATARAX) tablet 25 mg (has no administration in time range)  sorbitol 70 % solution 30 mL (has no administration in time range)  docusate sodium (ENEMEEZ) enema 283 mg (has no administration in time range)  ondansetron (ZOFRAN) tablet 4 mg (has no administration in time range)    Or  ondansetron (ZOFRAN) injection 4 mg (has no administration in time range)  calcium carbonate (dosed in mg elemental calcium) suspension 500 mg of elemental calcium (has no administration in time range)  feeding supplement (NEPRO CARB STEADY) liquid 237 mL (  has no administration in time range)  heparin injection 5,000 Units (has no administration in time range)  lactated ringers infusion ( Intravenous New Bag/Given 04/21/23 0103)  morphine (PF) 2 MG/ML injection 2 mg (has no administration in time range)  ondansetron (ZOFRAN) injection 4 mg (4 mg Intravenous Given 04/20/23 2213)  lactated ringers bolus 500 mL (500 mLs Intravenous Bolus 04/20/23 2213)  hydrALAZINE (APRESOLINE) injection 10 mg (10 mg Intravenous Given  04/20/23 2213)  prochlorperazine (COMPAZINE) injection 10 mg (10 mg Intravenous Given 04/20/23 2309)  diphenhydrAMINE (BENADRYL) injection 25 mg (25 mg Intravenous Given 04/20/23 2308)  morphine (PF) 2 MG/ML injection 2 mg (2 mg Intravenous Given 04/20/23 2338)    Mobility walks     Focused Assessments     R Recommendations: See Admitting Provider Note  Report given to:   Additional Notes:  ]

## 2023-04-21 NOTE — Progress Notes (Signed)
PROGRESS NOTE    Gary Cox  ZOX:[id number] DOB: 05/25/1986 DOA: 04/20/2023 PCP: Gary Maroon, FNP  Chief Complaint  Patient presents with   Hypertension    Brief Narrative:   Gary Cox is Gary Cox 37 y.o. 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  Patient was apparently at [name] family gathering today when he started having the headaches.  Blood pressure at the time check was 193/123.  He has been having the headache on and off for the last 4 days.  Also constipation during the..  Patient came to the ER where he was seen and evaluated.  Appears to have blood pressure of 191/125 on arrival.  Also pulse of 103.  He appears to have hypertensive urgency.  Renal function also has gotten worse creatinine now 3.41 up from 2.942 months ago.  Nephrology was consulted and will see patient in the hospital.  He is being admitted with hypertensive urgency and worsening renal function.  Also ongoing headache.  Head CT without contrast currently pending to workup the headache.   Assessment & Plan:   Principal Problem:   Hypertensive urgency Active Problems:   Acute renal failure (ARF) (HCC)   Chronic IgA nephropathy   Tobacco dependence   Headache  Hypertensive Crisis  Suspect this represents hypertensive crisis with severe hypertension with headache, acute kidney injury  Head CT without acute intracranial process, chronic encephalomalacia in bilateral frontal lobes HTN appears chronic given BP 02/2023 in ED 169/119 Continue IV labetalol, hydralazine prn Amlodipine 10 mg (home med), will add PO hydralazine - uptitrate as tolerated Appreciate renal assistance in the setting of his IgA nephropathy Will get echocardiogram, urinalysis, TSH  Intractable Headache Unilateral, right sided, migraine vs blood pressure related Continue APAP.   Unable to use NSAIDs with CKD. Headache cocktail for intractable pain Consider additional imaging if not improving, head CT as above  AKI on CKD IV  IgA Nephropathy Baseline appears to be around 2.4-2.9 (12/2022-02/2023) 3.4 on presentation here, meets criteria for acute kidney injury (renal function appears to be gradually worsening) Improved today, will monitor.   Follow UA Appreciate renal assistance Takes tarpeyo 4 mg daily, lokelma 10 mg daily, and farxiga (hold farxiga for now with AKI, will defer tarpeyo to renal as not on formulary here, hold lokelma given normal potassium)  Tobacco Abuse Encourage cessation     DVT prophylaxis: heparin Code Status: full Family Communication: none Disposition:   Status is: Inpatient Remains inpatient appropriate because: need for inpatient care for hypertensive crisis   Consultants:  none  Procedures:  none  Antimicrobials:  Anti-infectives (From admission, onward)    None       Subjective: HA 4/10, no vision changes, numbness, tingling weakness R sided, has hard time describing quality He declined arabic interpreter, I told him to not hesitate to ask if he wanted one  Objective: Vitals:   04/21/23 0130 04/21/23 0210 04/21/23 0438 04/21/23 0750  BP: (!) 168/105 (!) 159/100 (!) 163/107 (!) 155/109  Pulse: 94 96 88 94  Resp: 15 18 18 18   Temp:  97.6 F (36.4 C) 97.6 F (36.4 C) 98.7 F (37.1 C)  TempSrc:  Oral Oral Oral  SpO2: 98% 99% 97% 96%  Weight:  81.6 kg    Height:        Intake/Output Summary (Last 24 hours) at 04/21/2023 1108 Last data filed  at 04/21/2023 1107 Gross per 24 hour  Intake 482.67 ml  Output 1375 ml  Net -892.33 ml   Filed Weights   04/20/23 2013 04/21/23 0210  Weight: 83 kg 81.6 kg    Examination:  General exam: Appears calm and comfortable, resting with lights off in bed Respiratory system: Clear to auscultation. Respiratory effort normal. Cardiovascular system: S1 & S2 heard, RRR.   Gastrointestinal system: Abdomen is nondistended, soft and nontender. Central nervous system: Alert and oriented. No focal neurological deficits. Extremities: no LEE   Data Reviewed: I have personally reviewed following labs and imaging studies  CBC: Recent Labs  Lab 04/20/23 2208 04/21/23 0104 04/21/23 0245  WBC 9.3 9.0 8.4  NEUTROABS 6.0  --   --   HGB 12.8* 12.6* 12.2*  HCT 37.9* 36.5* 34.8*  MCV 83.1 84.1 83.9  PLT 257 244 239    Basic Metabolic Panel: Recent Labs  Lab 04/20/23 2208 04/21/23 0104 04/21/23 0245  NA 138  --  141  K 4.2  --  3.7  CL 107  --  107  CO2 23  --  22  GLUCOSE 89  --  115*  BUN 40*  --  37*  CREATININE 3.41* 3.22* 3.23*  CALCIUM 8.4*  --  8.2*  PHOS  --   --  4.4    GFR: Estimated Creatinine Clearance: 33 mL/min ([name]) (by C-G formula based on SCr of 3.23 mg/dL (H)).  Liver Function Tests: Recent Labs  Lab 04/20/23 2208 04/21/23 0245  AST 21  --   ALT 17  --   ALKPHOS 43  --   BILITOT 0.7  --   PROT 6.2*  --   ALBUMIN 2.9* 2.6*    CBG: No results for input(s): "GLUCAP" in the last 168 hours.   No results found for this or any previous visit (from the past 240 hour(s)).       Radiology Studies: CT Head Wo Contrast  Result Date: 04/21/2023 CLINICAL DATA:  Headache EXAM: CT HEAD WITHOUT CONTRAST TECHNIQUE: Contiguous axial images were obtained from the base of the skull through the vertex without intravenous contrast. RADIATION DOSE REDUCTION: This exam was performed according to the departmental dose-optimization program which includes automated exposure control, adjustment of the mA and/or kV according to patient size and/or use of iterative reconstruction technique. COMPARISON:  None Available. FINDINGS: Brain: No evidence of acute infarction, hemorrhage, mass, mass effect, or midline shift. No hydrocephalus or extra-axial fluid collection. Chronic encephalomalacia in the bilateral medial frontal lobes, which may be  posttraumatic or reflect sequela of remote ACA infarcts. Vascular: No hyperdense vessel. Skull: Negative for fracture or focal lesion. Sinuses/Orbits: Mucosal thickening in the right maxillary sinus. No acute finding in the orbits. Other: The mastoid air cells are well aerated. IMPRESSION: 1. No acute intracranial process. 2. 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 Electronically Signed   By: Wiliam Ke M.D.   On: 04/21/2023 01:04        Scheduled Meds:  amLODipine  10 mg Oral Daily   dapagliflozin propanediol  10 mg Oral Daily   heparin  5,000 Units Subcutaneous Q8H   hydrALAZINE  25 mg Oral Q8H   [START ON 04/22/2023] sodium zirconium cyclosilicate  5 g Oral QAC breakfast   Continuous Infusions:   LOS: 1 day    Time spent: over 30 min    Lacretia Nicks, MD [facility]  To contact the attending provider between 7A-7P or the covering provider during after hours 7P-7A, please log into the web site [email or web address] and access using universal [place] password for that web site. If you do not have the password, please call the hospital operator.  04/21/2023, 11:08 AM

## 2023-04-22 ENCOUNTER — Inpatient Hospital Stay (HOSPITAL_COMMUNITY): Payer: Medicaid Other

## 2023-04-22 DIAGNOSIS — R008 Other abnormalities of heart beat: Secondary | ICD-10-CM | POA: Diagnosis not present

## 2023-04-22 DIAGNOSIS — I160 Hypertensive urgency: Secondary | ICD-10-CM | POA: Diagnosis not present

## 2023-04-22 LAB — CBC WITH DIFFERENTIAL/PLATELET
Abs Immature Granulocytes: 0.03 10*3/uL (ref 0.00–0.07)
Basophils Absolute: 0.1 10*3/uL (ref 0.0–0.1)
Basophils Relative: 1 %
Eosinophils Absolute: 0.1 10*3/uL (ref 0.0–0.5)
Eosinophils Relative: 2 %
HCT: 38.4 % — ABNORMAL LOW (ref 39.0–52.0)
Hemoglobin: 13.3 g/dL (ref 13.0–17.0)
Immature Granulocytes: 0 %
Lymphocytes Relative: 41 %
Lymphs Abs: 3.5 10*3/uL (ref 0.7–4.0)
MCH: 29.2 pg (ref 26.0–34.0)
MCHC: 34.6 g/dL (ref 30.0–36.0)
MCV: 84.2 fL (ref 80.0–100.0)
Monocytes Absolute: 0.5 10*3/uL (ref 0.1–1.0)
Monocytes Relative: 6 %
Neutro Abs: 4.3 10*3/uL (ref 1.7–7.7)
Neutrophils Relative %: 50 %
Platelets: 235 10*3/uL (ref 150–400)
RBC: 4.56 MIL/uL (ref 4.22–5.81)
RDW: 12.6 % (ref 11.5–15.5)
WBC: 8.6 10*3/uL (ref 4.0–10.5)
nRBC: 0 % (ref 0.0–0.2)

## 2023-04-22 LAB — COMPREHENSIVE METABOLIC PANEL
ALT: 20 U/L (ref 0–44)
AST: 15 U/L (ref 15–41)
Albumin: 2.8 g/dL — ABNORMAL LOW (ref 3.5–5.0)
Alkaline Phosphatase: 42 U/L (ref 38–126)
Anion gap: 10 (ref 5–15)
BUN: 42 mg/dL — ABNORMAL HIGH (ref 6–20)
CO2: 24 mmol/L (ref 22–32)
Calcium: 8.7 mg/dL — ABNORMAL LOW (ref 8.9–10.3)
Chloride: 108 mmol/L (ref 98–111)
Creatinine, Ser: 3.75 mg/dL — ABNORMAL HIGH (ref 0.61–1.24)
GFR, Estimated: 21 mL/min — ABNORMAL LOW (ref >60)
Glucose, Bld: 111 mg/dL — ABNORMAL HIGH (ref 70–99)
Potassium: 4.1 mmol/L (ref 3.5–5.1)
Sodium: 142 mmol/L (ref 135–145)
Total Bilirubin: 0.7 mg/dL (ref 0.3–1.2)
Total Protein: 5.9 g/dL — ABNORMAL LOW (ref 6.5–8.1)

## 2023-04-22 LAB — ECHOCARDIOGRAM COMPLETE
Area-P 1/2: 4.26 cm2
Height: 67 in
S' Lateral: 2.8 cm
Weight: 2812.8 oz

## 2023-04-22 LAB — GLUCOSE, CAPILLARY
Glucose-Capillary: 165 mg/dL — ABNORMAL HIGH (ref 70–99)
Glucose-Capillary: 180 mg/dL — ABNORMAL HIGH (ref 70–99)

## 2023-04-22 LAB — MAGNESIUM: Magnesium: 1.9 mg/dL (ref 1.7–2.4)

## 2023-04-22 LAB — PHOSPHORUS: Phosphorus: 4.4 mg/dL (ref 2.5–4.6)

## 2023-04-22 MED ORDER — HYDRALAZINE HCL 25 MG PO TABS
25.0000 mg | ORAL_TABLET | Freq: Three times a day (TID) | ORAL | Status: DC
  Administered 2023-04-22 – 2023-04-24 (×5): 25 mg via ORAL
  Filled 2023-04-22 (×6): qty 1

## 2023-04-22 MED ORDER — NALOXONE HCL 0.4 MG/ML IJ SOLN
0.4000 mg | INTRAMUSCULAR | Status: DC | PRN
  Administered 2023-04-22: 0.4 mg via INTRAVENOUS
  Filled 2023-04-22 (×2): qty 1

## 2023-04-22 MED ORDER — SODIUM CHLORIDE 0.9 % IV BOLUS
1000.0000 mL | Freq: Once | INTRAVENOUS | Status: AC
  Administered 2023-04-22: 1000 mL via INTRAVENOUS

## 2023-04-22 MED ORDER — FUROSEMIDE 10 MG/ML IJ SOLN
40.0000 mg | Freq: Once | INTRAMUSCULAR | Status: DC
  Filled 2023-04-22: qty 4

## 2023-04-22 MED ORDER — POLYETHYLENE GLYCOL 3350 17 G PO PACK
17.0000 g | PACK | Freq: Every day | ORAL | Status: DC
  Administered 2023-04-22 – 2023-04-25 (×4): 17 g via ORAL
  Filled 2023-04-22 (×5): qty 1

## 2023-04-22 MED ORDER — NALOXONE HCL 0.4 MG/ML IJ SOLN
0.4000 mg | Freq: Once | INTRAMUSCULAR | Status: DC
  Filled 2023-04-22: qty 1

## 2023-04-22 MED ORDER — HYDRALAZINE HCL 50 MG PO TABS
50.0000 mg | ORAL_TABLET | Freq: Three times a day (TID) | ORAL | Status: DC
  Filled 2023-04-22: qty 1

## 2023-04-22 MED ORDER — HYDRALAZINE HCL 20 MG/ML IJ SOLN: 10.0000 mg | Freq: Four times a day (QID) | INTRAMUSCULAR | Status: DC | PRN

## 2023-04-22 MED ORDER — HYDRALAZINE HCL 25 MG PO TABS
25.0000 mg | ORAL_TABLET | Freq: Once | ORAL | Status: AC
  Administered 2023-04-22: 25 mg via ORAL
  Filled 2023-04-22: qty 1

## 2023-04-22 MED ORDER — LABETALOL HCL 5 MG/ML IV SOLN: 10.0000 mg | INTRAVENOUS | Status: DC | PRN

## 2023-04-22 MED ORDER — LORAZEPAM 2 MG/ML IJ SOLN
INTRAMUSCULAR | Status: AC
  Filled 2023-04-22: qty 1

## 2023-04-22 MED ORDER — PROCHLORPERAZINE EDISYLATE 10 MG/2ML IJ SOLN
10.0000 mg | Freq: Once | INTRAMUSCULAR | Status: AC
  Administered 2023-04-22: 10 mg via INTRAVENOUS
  Filled 2023-04-22: qty 2

## 2023-04-22 MED ORDER — DIPHENHYDRAMINE HCL 50 MG/ML IJ SOLN
12.5000 mg | Freq: Once | INTRAMUSCULAR | Status: AC
  Administered 2023-04-22: 12.5 mg via INTRAVENOUS
  Filled 2023-04-22: qty 1

## 2023-04-22 MED ORDER — MIDAZOLAM HCL 2 MG/2ML IJ SOLN
2.0000 mg | Freq: Once | INTRAMUSCULAR | Status: AC
  Administered 2023-04-22: 2 mg via INTRAVENOUS
  Filled 2023-04-22: qty 2

## 2023-04-22 MED ORDER — LORAZEPAM 2 MG/ML IJ SOLN: 2.0000 mg | Freq: Once | INTRAMUSCULAR | Status: DC | PRN

## 2023-04-22 MED ORDER — CARVEDILOL 6.25 MG PO TABS
6.2500 mg | ORAL_TABLET | Freq: Two times a day (BID) | ORAL | Status: DC
  Administered 2023-04-22 – 2023-04-25 (×6): 6.25 mg via ORAL
  Filled 2023-04-22 (×6): qty 1

## 2023-04-22 MED ORDER — CARVEDILOL 12.5 MG PO TABS: 12.5000 mg | ORAL_TABLET | Freq: Two times a day (BID) | ORAL | Status: DC

## 2023-04-22 MED ORDER — FUROSEMIDE 10 MG/ML IJ SOLN
20.0000 mg | Freq: Once | INTRAMUSCULAR | Status: AC
  Administered 2023-04-22: 20 mg via INTRAVENOUS

## 2023-04-22 MED ORDER — LEVETIRACETAM 250 MG PO TABS
250.0000 mg | ORAL_TABLET | Freq: Two times a day (BID) | ORAL | Status: DC
  Administered 2023-04-22 – 2023-04-25 (×6): 250 mg via ORAL
  Filled 2023-04-22 (×6): qty 1

## 2023-04-22 MED ORDER — NALOXONE HCL 0.4 MG/ML IJ SOLN
INTRAMUSCULAR | Status: AC
  Administered 2023-04-22: 0.4 mg via INTRAVENOUS
  Filled 2023-04-22: qty 1

## 2023-04-22 NOTE — Progress Notes (Signed)
Patient is not available for EEG at this moment, patient is at imaging, will try back as schedule allows.

## 2023-04-22 NOTE — Significant Event (Signed)
Rapid Response Event Note   Reason for Call :  Lethargy- RN unable to arouse patient  Initial Focused Assessment:  Pt lying in bed, will open eyes however non-sustained. Eyes midline, PERRLA- 3mm. Respirations shallow, 12 breaths/min. Breathing unlabored. Skin warm, dry, pink. Pulses 1+ peripherally.   Pt will move all extremities to noxious stimuli. He is oriented to place and name, however disoriented to time and situation.   VS: BP 95/57, HR 110, RR 12, SpO2 94% on room air CBG: 165  Last narcotic dose: Morphine 2mg  IV @ 0826  Narcan 0.4mg  IV x1 given. Pt will maintain eye opening, however needs verbal stimuli to remain engaged. Grips equally, moving bilateral lower extremities equally. Second dose 0.4mg  IV Narcan given.   VS: BP 96/54, HR 108, RR 14, SpO2 96% on room air  Interventions:  -Narcan 0.4mg  IV x2 -1L IVF bolus  Plan of Care:  -BUN/Cr: 42/3.75 -Pt receiving scheduled PO Hydralazine and Coreg for hypertensive emergency- Hold 1445 dose of Hydralazine per Dr. Lowell Guitar -PRN Narcan -Close monitoring of VS and mentation- f/u with provider following completion of IVF bolus  Call rapid response for additional needs  Event Summary:  MD Notified: Dr. Lowell Guitar Call Time: 1337 Arrival Time: 1340 End Time: 1410  Jennye Moccasin, RN

## 2023-04-22 NOTE — Progress Notes (Addendum)
[facility] Progress Note  Name: Gary Cox MRN: [id number] DOB: February 14, 1986  Chief Complaint:  Headache and HTN   Subjective:  He had 2.1 liters UOP as well as one unmeasured urine void over 6/17.  Blood pressure had improved to 140's systolic but then systolic and diastolic BP are both up this AM.  Spoke with his wife on the phone.  Patient still has a headache and they are worried about BP being worse.  They want to focus on getting him better.   Review of systems:  He reports Constipation - no BM yesterday  Denies shortness of breath or chest pain  Denies n/v   ----------------- Background on referral:  Gary Cox is a 37 y.o. male with a history including CKD, IgA nephropathy, HTN, recurrent UTI, and tobacco abuse who presented to the hospital with considerable headache.  He was found to be markedly hypertensive.  Blood pressure on initial check was 193/123 per charting.  Nephrology is consulted for assistance with management of CKD and hypertension.  He has been on [place] and until recently had been on CellCept and lisinopril 10 mg daily.  He was recently on Lokelma.  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  Note SGLT2i and ACEi, started around 2022.  He was previously on prednisone from 09/04/21 - 03/2022 per charting.  Later he got another biopsy on 11/07/22 which demonstrated severe chronic injury with no glomerular activity.  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 No mesangial expansion or hypercellularity and no endocapillary hypercellularity, fibrinoid necrosis, wire loops hyaline thrombi, or cellular crescent formation.  He saw [facility] in 12/2022 and tarpeyo was recommended.  On 04/09/2023 he had labs demonstrating a creatinine of 3.67 and a potassium of 6.3.  In response to that, his  lisinopril was stopped per charting.  He had not been using Lokelma and he picked up samples.  Repeat labs done on 6/7 had a creatinine of 3.37 and a potassium of 5.5.  Lokelma dosing was increased to 10 g daily.  On his last outpatient labs: 04/18/23 - Cr 3.55, BUN 46, eGFR 22, K 6.1, bicarb 24.  Note his amlodipine was increased to 10 mg daily after 6/14 visit and per charting next step was to be coreg.  Per the 6/14 labs with hyperkalemia he was to be started on chlorthalidone (however hadn't actually started it yet as just got message today).   Note that in the past he had history of acute HTN/emergency and seizure (vs. Syncope per report?) 03/2021 at [facility].    I spoke with the patient and his wife joined via speakerphone.  She provides the bulk of the history.  I offered to call an Arabic interpreter and he declined; he said that he was comfortable speaking with me in [place].  His headache is getting better.  His BP has gotten better here.  They had been called by [facility] to start chlorthalidone but just got this message today.  His wife is pregnant and due any day now.  His wife would feel most comfortable if he were to stay tonight because we are starting a new medication.  There is family who can take her to the hospital if needed.  He reports constipation as his only other concern.     Intake/Output Summary (Last 24  hours) at 04/22/2023 0826 Last data filed at 04/22/2023 0726 Gross per 24 hour  Intake 1180 ml  Output 1425 ml  Net -245 ml    Vitals:  Vitals:   04/21/23 2211 04/22/23 0017 04/22/23 0409 04/22/23 0725  BP: (!) 151/96 (!) 146/97 (!) 148/106 (!) 176/127  Pulse:  98 (!) 103 97  Resp:  18 18 18   Temp:  98.7 F (37.1 C) 98.7 F (37.1 C) 98.1 F (36.7 C)  TempSrc:  Oral Oral Oral  SpO2:  98% 97% 99%  Weight:   79.7 kg   Height:         Physical Exam:  General adult male in bed in no acute distress HEENT normocephalic atraumatic extraocular movements intact  sclera anicteric Neck supple trachea midline Lungs clear to auscultation bilaterally normal work of breathing at rest on room air  Heart S1S2 no rub Abdomen soft nontender nondistended Extremities no edema  Psych normal mood and affect Neuro - alert and oriented x 3 provides hx and follows commands  Medications reviewed   Labs:     Latest Ref Rng & Units 04/21/2023    2:45 AM 04/21/2023    1:04 AM 04/20/2023   10:08 PM  BMP  Glucose 70 - 99 mg/dL 161   89   BUN 6 - 20 mg/dL 37   40   Creatinine 0.96 - 1.24 mg/dL 0.45  4.09  8.11   Sodium 135 - 145 mmol/L 141   138   Potassium 3.5 - 5.1 mmol/L 3.7   4.2   Chloride 98 - 111 mmol/L 107   107   CO2 22 - 32 mmol/L 22   23   Calcium 8.9 - 10.3 mg/dL 8.2   8.4     Lab trends: per [facility] and primary neph charting:  Creatinine/proteinuria: 04/2021: 1.6 07/13/21: 2.0 09/04/21: 2.07, ACR 2.6 10/18/21: 1.97, ACR 2500 11/30/21: 1.87 10/11/22: 2.79 10/14/22: 2.97, ACR 2.3  04/09/2023 - Cr 3.67 and a potassium of 6.3.   6/7 - Cr 3.37 and a potassium of 5.5 04/18/23 - Cr 3.55, K 6.1, BUN 46   Assessment/Plan:   # AKI  - Setting of hypertensive emergency and failure of autoregulation    # CKD stage IV with progressive disease - Secondary to IgA nephropathy  - Follows with Dr. Marisue Humble at [facility] and has seen [facility] (Dr. Elson Clan) for additional opinion  - Please choose an alternative to morphine for pain in light of the AKI/CKD   # IgA nephropathy - As an outpatient is on tarpeyo (budesonide) however note is not on formulary here - Per [facility] the degree of scarring demonstrated indicates that immunosuppression would be of minimal benefit - he is now off of cellcept    # Hypertensive emergency  - Had been on RAAS blockade - this was discontinued recently due to hyperkalemia  - Control is improving - We started coreg 6.25 mg BID yesterday  - Continue hydralazine - increase to 50 mg TID - Lasix 40 mg IV once   # Hyperkalemia - resolved per  last labs and was secondary to lisinopril in setting of advanced CKD - note he had recurrent hyperkalemia while on low dose RAAS blockade  - has been on daily lokelma outpatient but for just a short time.  This can be held for now.  K 3.7 here   # Headache - Secondary to hypertensive emergency    # Constipation  - Ordered miralax daily  Disposition -  still hypertensive and would recommend continued monitoring today with BP med adjustment as above.  If labs stable then he would be acceptable for discharge home as early as tomorrow from a strictly renal standpoint   Estanislado Emms, MD 04/22/2023 8:57 AM

## 2023-04-22 NOTE — Progress Notes (Signed)
EEG complete - results pending 
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
## 2023-04-22 NOTE — Care Plan (Addendum)
Secure chatted about plan of care with Dr. Lowell Guitar d/t elevated b/p overnight and currently high b/p.   Chatted with Dr. Malen Gauze about Lasix administration given his Cr. Dr. Malen Gauze is aware and okayed the administration of Lasix. Stated nephrology is onboard concerning administration of lasix

## 2023-04-22 NOTE — Significant Event (Addendum)
Rapid Response Event Note   Reason for Call :  Seizure like activity witnessed by wife over face time. Pt somnolent on RN arrival to room.  Wife witnessed pt complain of a headache, then she saw him shaking.   Initial Focused Assessment:  Pt lying in bed, eyes midline and half closed. Snorous respirations. Breathing unlabored, tachypneic. PERRLA, 4mm. Pt moving all extremities to noxious stimuli.   VS: T 98.19F, BP 124/82, HR 140, RR 22, SpO2 94% on room air CBG: 180  Mentation slowly improving. Pt able to answer orientation questions. Oriented to person, place. Pt unaware of preceding events. Pt remains lethargic, some restlessness. Skin is warm, dry, pink.   Interventions:  -CBG -MRI  -EEG -Neurology consult  Plan of Care:  -Seizure precautions -PRN Ativan for seizure activity greater than 2 min   Call rapid response for additional needs  Event Summary:  MD Notified: Dr. Lowell Guitar Call Time: 1723 Arrival Time: 1725 End Time: 1815  Jennye Moccasin, RN

## 2023-04-22 NOTE — Progress Notes (Addendum)
PROGRESS NOTE    Gary Cox  BJY:[id number] DOB: [birth year] DOA: 04/20/2023 PCP: Massie Maroon, FNP  Chief Complaint  Patient presents with   Hypertension    Brief Narrative:   Gary Cox is Gary Cox 37 y.o. 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  Patient was apparently at [place] family gathering today when he started having the headaches.  Blood pressure at the time check was 193/123.  He was admitted with hypertensive emergency.  Currently adjusting BP meds with assistance of renal.  See below for additional details  Assessment & Plan:   Principal Problem:   Hypertensive urgency Active Problems:   Acute renal failure (ARF) (HCC)   Chronic IgA nephropathy   Tobacco dependence   Headache  Addendum 6:08 PM Generalized shaking concerning for seizure like activity noted while he was on phone with wife.  Appears to be postictal now.  Neurology consulted, planning for MRI brain.  Of note in 2022 there was concern for Gary Cox possible seizure episode.  Appreciate neurology assistance.   Acute Metabolic Encephalopathy Most likely due to relative hypotension with pressures in the 90's systolic (after being 170's over 120's this AM).  Differential dx includes due to medications (morphine with his CKD), received IV benadryl/compazine for headache (though timing doesn't particularly fit).  No focal deficits when I reevaluated him, he was listless.  On reevaluation, looking better with SBP 130s/90s.  Will continue to monitor for now, decrease hydralazine back to 25 mg TID and decrease coreg to 6.25 mg BID.  Will loosen parameters for IV antihypertensives.  Hypertensive Crisis  Suspect this represents hypertensive crisis with severe hypertension with headache, acute kidney injury  Head CT without acute intracranial process, chronic  encephalomalacia in bilateral frontal lobes HTN appears chronic given BP 02/2023 in ED 169/119 Continue IV labetalol, hydralazine prn Amlodipine 10 mg (home med), PO hydralazine - uptitrate as tolerated Appreciate renal assistance in the setting of his IgA nephropathy Will get echocardiogram, urinalysis (with RBCs, proteinuria), TSH wnl  Intractable Headache Unilateral, right sided, migraine vs blood pressure related Continue APAP.  Unable to use NSAIDs with CKD. Headache cocktail for intractable pain Consider additional imaging if not improving, head CT as above  AKI on CKD IV  IgA Nephropathy Baseline appears to be around 2.4-2.9 (12/2022-02/2023) 3.4 on presentation here, meets criteria for acute kidney injury (renal function appears to be gradually worsening) Improved today, will monitor.   Follow [facility] Appreciate renal assistance Takes tarpeyo 4 mg daily, lokelma 10 mg daily, and farxiga (hold farxiga for now with AKI, will defer tarpeyo to renal as not on formulary here, hold lokelma given normal potassium)  Tobacco Abuse Encourage cessation     DVT prophylaxis: heparin Code Status: full Family Communication: none Disposition:   Status is: Inpatient Remains inpatient appropriate because: need for inpatient care for hypertensive crisis   Consultants:  none  Procedures:  none  Antimicrobials:  Anti-infectives (From admission, onward)    None       Subjective: Continued HA today  Objective: Vitals:   04/22/23 1350 04/22/23 1355 04/22/23 1400 04/22/23 1415  BP: (!) 96/54 (!) 99/56 104/60 118/73  Pulse: (!) 109 (!) 105 (!) 110 100  Resp:      Temp:      TempSrc:      SpO2: 96% 94% 95% 95%  Weight:  Height:        Intake/Output Summary (Last 24 hours) at 04/22/2023 1537 Last data filed at 04/22/2023 0726 Gross per 24 hour  Intake 700 ml  Output 325 ml  Net 375 ml   Filed Weights   04/20/23 2013 04/21/23 0210 04/22/23 0409  Weight: 83 kg 81.6 kg  79.7 kg    Examination:  General: No acute distress. Cardiovascular: RRR Lungs: unlabored Abd: s/nt/nd Neurological: Alert and oriented 3. Moves all extremities 4 with equal strength. Cranial nerves II through XII grossly intact. Extremities: No clubbing or cyanosis. No edema.  Data Reviewed: I have personally reviewed following labs and imaging studies  CBC: Recent Labs  Lab 04/20/23 2208 04/21/23 0104 04/21/23 0245 04/22/23 0740  WBC 9.3 9.0 8.4 8.6  NEUTROABS 6.0  --   --  4.3  HGB 12.8* 12.6* 12.2* 13.3  HCT 37.9* 36.5* 34.8* 38.4*  MCV 83.1 84.1 83.9 84.2  PLT 257 244 239 235    Basic Metabolic Panel: Recent Labs  Lab 04/20/23 2208 04/21/23 0104 04/21/23 0245 04/22/23 0740  NA 138  --  141 142  K 4.2  --  3.7 4.1  CL 107  --  107 108  CO2 23  --  22 24  GLUCOSE 89  --  115* 111*  BUN 40*  --  37* 42*  CREATININE 3.41* 3.22* 3.23* 3.75*  CALCIUM 8.4*  --  8.2* 8.7*  MG  --   --   --  1.9  PHOS  --   --  4.4 4.4    GFR: Estimated Creatinine Clearance: 28.1 mL/min (Gary Cox) (by C-G formula based on SCr of 3.75 mg/dL (H)).  Liver Function Tests: Recent Labs  Lab 04/20/23 2208 04/21/23 0245 04/22/23 0740  AST 21  --  15  ALT 17  --  20  ALKPHOS 43  --  42  BILITOT 0.7  --  0.7  PROT 6.2*  --  5.9*  ALBUMIN 2.9* 2.6* 2.8*    CBG: Recent Labs  Lab 04/22/23 1340  GLUCAP 165*     No results found for this or any previous visit (from the past 240 hour(s)).       Radiology Studies: ECHOCARDIOGRAM COMPLETE  Result Date: 04/22/2023    ECHOCARDIOGRAM REPORT   Patient Name:   Gary Cox Date of Exam: 04/22/2023 Medical Rec #:  [id number]     Height:       67.0 in Accession #:    [id number]    Weight:       175.8 lb Date of Birth:  1986/09/30     BSA:          1.914 m Patient Age:    36 years      BP:           166/111 mmHg Patient Gender: M             HR:           92 bpm. Exam Location:  Inpatient Procedure: 2D Echo, Color Doppler and Cardiac  Doppler Indications:    R00.8 Other abnormalities of heart  History:        Patient has no prior history of Echocardiogram examinations.                 Risk Factors:Former Smoker. CKD, IgA nephropathy, HTN.  Sonographer:    Milda Smart Referring Phys: [phone] Gary Cox IMPRESSIONS  1. Left ventricular ejection fraction, by estimation, is  60 to 65%. The left ventricle has normal function. The left ventricle has no regional wall motion abnormalities. Left ventricular diastolic parameters were normal.  2. Right ventricular systolic function is normal. The right ventricular size is normal. Tricuspid regurgitation signal is inadequate for assessing PA pressure.  3. The mitral valve is normal in structure. No evidence of mitral valve regurgitation. No evidence of mitral stenosis.  4. The aortic valve is normal in structure. Aortic valve regurgitation is not visualized. No aortic stenosis is present.  5. The inferior vena cava is normal in size with greater than 50% respiratory variability, suggesting right atrial pressure of 3 mmHg. FINDINGS  Left Ventricle: Left ventricular ejection fraction, by estimation, is 60 to 65%. The left ventricle has normal function. The left ventricle has no regional wall motion abnormalities. The left ventricular internal cavity size was normal in size. There is  no left ventricular hypertrophy. Left ventricular diastolic parameters were normal. Right Ventricle: The right ventricular size is normal. No increase in right ventricular wall thickness. Right ventricular systolic function is normal. Tricuspid regurgitation signal is inadequate for assessing PA pressure. Left Atrium: Left atrial size was normal in size. Right Atrium: Right atrial size was normal in size. Pericardium: There is no evidence of pericardial effusion. Mitral Valve: The mitral valve is normal in structure. No evidence of mitral valve regurgitation. No evidence of mitral valve stenosis. Tricuspid Valve: The  tricuspid valve is normal in structure. Tricuspid valve regurgitation is not demonstrated. No evidence of tricuspid stenosis. Aortic Valve: The aortic valve is normal in structure. Aortic valve regurgitation is not visualized. No aortic stenosis is present. Pulmonic Valve: The pulmonic valve was normal in structure. Pulmonic valve regurgitation is not visualized. No evidence of pulmonic stenosis. Aorta: The aortic root is normal in size and structure. Venous: The inferior vena cava is normal in size with greater than 50% respiratory variability, suggesting right atrial pressure of 3 mmHg. IAS/Shunts: No atrial level shunt detected by color flow Doppler.  LEFT VENTRICLE PLAX 2D LVIDd:         4.20 cm   Diastology LVIDs:         2.80 cm   LV e' medial:    9.79 cm/s LV PW:         0.80 cm   LV E/e' medial:  8.3 LV IVS:        0.90 cm   LV e' lateral:   11.90 cm/s LVOT diam:     2.20 cm   LV E/e' lateral: 6.8 LV SV:         81 LV SV Index:   42 LVOT Area:     3.80 cm  RIGHT VENTRICLE             IVC RV S prime:     12.80 cm/s  IVC diam: 1.20 cm TAPSE (M-mode): 2.1 cm LEFT ATRIUM             Index        RIGHT ATRIUM           Index LA diam:        3.50 cm 1.83 cm/m   RA Area:     11.90 cm LA Vol (A2C):   33.5 ml 17.50 ml/m  RA Volume:   27.10 ml  14.16 ml/m LA Vol (A4C):   23.8 ml 12.43 ml/m LA Biplane Vol: 27.2 ml 14.21 ml/m  AORTIC VALVE LVOT Vmax:   120.00 cm/s LVOT Vmean:  81.700  cm/s LVOT VTI:    0.213 m  AORTA Ao Root diam: 2.70 cm Ao Asc diam:  3.00 cm MITRAL VALVE MV Area (PHT): 4.26 cm    SHUNTS MV Decel Time: 178 msec    Systemic VTI:  0.21 m MV E velocity: 80.90 cm/s  Systemic Diam: 2.20 cm MV Latria Mccarron velocity: 55.30 cm/s MV E/Orlinda Slomski ratio:  1.46 Mihai Croitoru MD Electronically signed by Thurmon Fair MD Signature Date/Time: 04/22/2023/12:24:12 PM    Final    CT Head Wo Contrast  Result Date: 04/21/2023 CLINICAL DATA:  Headache EXAM: CT HEAD WITHOUT CONTRAST TECHNIQUE: Contiguous axial images were obtained  from the base of the skull through the vertex without intravenous contrast. RADIATION DOSE REDUCTION: This exam was performed according to the departmental dose-optimization program which includes automated exposure control, adjustment of the mA and/or kV according to patient size and/or use of iterative reconstruction technique. COMPARISON:  None Available. FINDINGS: Brain: No evidence of acute infarction, hemorrhage, mass, mass effect, or midline shift. No hydrocephalus or extra-axial fluid collection. 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 Vascular: No hyperdense vessel. Skull: Negative for fracture or focal lesion. Sinuses/Orbits: Mucosal thickening in the right maxillary sinus. No acute finding in the orbits. Other: The mastoid air cells are well aerated. IMPRESSION: 1. No acute intracranial process. 2. 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 Electronically Signed   By: Wiliam Ke M.D.   On: 04/21/2023 01:04        Scheduled Meds:  acetaminophen  1,000 mg Oral Q8H   amLODipine  10 mg Oral Daily   carvedilol  6.25 mg Oral BID WC   heparin  5,000 Units Subcutaneous Q8H   hydrALAZINE  25 mg Oral Q8H   naLOXone (NARCAN)  injection  0.4 mg Intravenous Once   polyethylene glycol  17 g Oral Daily   Continuous Infusions:   LOS: 2 days    Time spent: over 30 min    Lacretia Nicks, MD [facility]   To contact the attending provider between 7A-7P or the covering provider during after hours 7P-7A, please log into the web site [email or web address] and access using universal [place] password for that web site. If you do not have the password, please call the hospital operator.  04/22/2023, 3:37 PM

## 2023-04-22 NOTE — Progress Notes (Signed)
  Echocardiogram 2D Echocardiogram has been performed.  Gary Cox 04/22/2023, 12:10 PM

## 2023-04-23 ENCOUNTER — Inpatient Hospital Stay (HOSPITAL_COMMUNITY): Payer: Medicaid Other

## 2023-04-23 DIAGNOSIS — G40009 Localization-related (focal) (partial) idiopathic epilepsy and epileptic syndromes with seizures of localized onset, not intractable, without status epilepticus: Secondary | ICD-10-CM | POA: Diagnosis present

## 2023-04-23 DIAGNOSIS — I160 Hypertensive urgency: Secondary | ICD-10-CM | POA: Diagnosis not present

## 2023-04-23 DIAGNOSIS — R569 Unspecified convulsions: Secondary | ICD-10-CM | POA: Diagnosis not present

## 2023-04-23 LAB — CBC WITH DIFFERENTIAL/PLATELET
Abs Immature Granulocytes: 0.05 10*3/uL (ref 0.00–0.07)
Basophils Absolute: 0.1 10*3/uL (ref 0.0–0.1)
Basophils Relative: 0 %
Eosinophils Absolute: 0.1 10*3/uL (ref 0.0–0.5)
Eosinophils Relative: 0 %
HCT: 39.5 % (ref 39.0–52.0)
Hemoglobin: 13.7 g/dL (ref 13.0–17.0)
Immature Granulocytes: 0 %
Lymphocytes Relative: 16 %
Lymphs Abs: 2 10*3/uL (ref 0.7–4.0)
MCH: 29.3 pg (ref 26.0–34.0)
MCHC: 34.7 g/dL (ref 30.0–36.0)
MCV: 84.6 fL (ref 80.0–100.0)
Monocytes Absolute: 0.8 10*3/uL (ref 0.1–1.0)
Monocytes Relative: 6 %
Neutro Abs: 10 10*3/uL — ABNORMAL HIGH (ref 1.7–7.7)
Neutrophils Relative %: 78 %
Platelets: 249 10*3/uL (ref 150–400)
RBC: 4.67 MIL/uL (ref 4.22–5.81)
RDW: 12.8 % (ref 11.5–15.5)
WBC: 13 10*3/uL — ABNORMAL HIGH (ref 4.0–10.5)
nRBC: 0 % (ref 0.0–0.2)

## 2023-04-23 LAB — RENAL FUNCTION PANEL
Albumin: 2.9 g/dL — ABNORMAL LOW (ref 3.5–5.0)
Anion gap: 12 (ref 5–15)
BUN: 40 mg/dL — ABNORMAL HIGH (ref 6–20)
CO2: 22 mmol/L (ref 22–32)
Calcium: 8.9 mg/dL (ref 8.9–10.3)
Chloride: 106 mmol/L (ref 98–111)
Creatinine, Ser: 4.12 mg/dL — ABNORMAL HIGH (ref 0.61–1.24)
GFR, Estimated: 19 mL/min — ABNORMAL LOW (ref >60)
Glucose, Bld: 108 mg/dL — ABNORMAL HIGH (ref 70–99)
Phosphorus: 4.9 mg/dL — ABNORMAL HIGH (ref 2.5–4.6)
Potassium: 4.2 mmol/L (ref 3.5–5.1)
Sodium: 140 mmol/L (ref 135–145)

## 2023-04-23 MED ORDER — PROCHLORPERAZINE EDISYLATE 10 MG/2ML IJ SOLN: 5.0000 mg | Freq: Four times a day (QID) | INTRAMUSCULAR | Status: DC | PRN

## 2023-04-23 MED ORDER — DIPHENHYDRAMINE HCL 50 MG/ML IJ SOLN: 12.5000 mg | Freq: Four times a day (QID) | INTRAMUSCULAR | Status: DC | PRN

## 2023-04-23 MED ORDER — VERAPAMIL HCL ER 120 MG PO TBCR
120.0000 mg | EXTENDED_RELEASE_TABLET | Freq: Every day | ORAL | Status: DC
  Administered 2023-04-23 – 2023-04-25 (×3): 120 mg via ORAL
  Filled 2023-04-23 (×3): qty 1

## 2023-04-23 MED ORDER — BUTALBITAL-APAP-CAFFEINE 50-325-40 MG PO TABS
1.0000 | ORAL_TABLET | Freq: Four times a day (QID) | ORAL | Status: DC | PRN
  Administered 2023-04-23: 1 via ORAL
  Filled 2023-04-23: qty 1

## 2023-04-23 MED ORDER — PROCHLORPERAZINE EDISYLATE 10 MG/2ML IJ SOLN
5.0000 mg | Freq: Four times a day (QID) | INTRAMUSCULAR | Status: DC | PRN
  Administered 2023-04-23: 5 mg via INTRAVENOUS
  Filled 2023-04-23: qty 2

## 2023-04-23 NOTE — Procedures (Addendum)
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 EEG to evaluate for seizure  Level of alertness: Awake  AEDs during EEG study: LEV  Technical aspects: This EEG study was done with scalp electrodes positioned according to the 10-20 International system of electrode placement. Electrical activity was reviewed with band pass filter of 1-70Hz , sensitivity of 7 uV/mm, display speed of 1mm/sec with a 60Hz  notched filter applied as appropriate. EEG data were recorded continuously and digitally stored.  Video monitoring was available and reviewed as appropriate.  Description: The posterior dominant rhythm consists of 8 Hz activity of moderate voltage (25-35 uV) seen predominantly in posterior head regions, symmetric and reactive to eye opening and eye closing. Frequent spikes were noted in left fronto-centro-temporal region.  Physiologic photic driving was not seen during photic stimulation.  Hyperventilation was not performed.       ABNORMALITY -Spike, left fronto-centro-temporal region  IMPRESSION: This study showed evidence of epileptogenicity arising from  left fronto-centro-temporal region. No seizures were seen throughout the recording.  [name]

## 2023-04-23 NOTE — Progress Notes (Signed)
[facility] Progress Note  Name: Gary Cox MRN: [id number] DOB: 1986-06-06  Chief Complaint:  Headache and HTN   Subjective:  Strict ins/outs are not available.  He had two unmeasured urine voids over 6/18.  He had marked diastolic hypertension yesterday and a headache.  He got hydralazine 50 mg in the AM and got lasix 20 mg IV once and got hydralazine IV.  He then had hypotension.  Per chart review, he had also gotten IV benadryl, morphine, and compazine for a headache cocktail.  He got a bolus in response to the hypotension.  Blood pressure stabilized and has been mostly in the 130's-140's/90's recently.  Creatinine has slightly worsened.  I spoke with his wife.  She states that she observed shaking concerning for seizure activity yesterday.  This happened once before within the past month or so when BP was high and once two years ago when BP was high.  He was seen by neurology and was started on keppra.   Review of systems:   Had a BM charted today Denies shortness of breath or chest pain  Some nausea  Still with headache  ----------------- Background on referral:  Gary Cox is a 37 y.o. male with a history including CKD, IgA nephropathy, HTN, recurrent UTI, and tobacco abuse who presented to the hospital with considerable headache.  He was found to be markedly hypertensive.  Blood pressure on initial check was 193/123 per charting.  Nephrology is consulted for assistance with management of CKD and hypertension.  He has been on [place] and until recently had been on CellCept and lisinopril 10 mg daily.  He was recently on Lokelma.  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  Note SGLT2i and ACEi, started around 2022.  He was previously on prednisone from 09/04/21 - 03/2022 per charting.  Later he got another biopsy on 11/07/22 which demonstrated severe  chronic injury with no glomerular activity.  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 No mesangial expansion or hypercellularity and no endocapillary hypercellularity, fibrinoid necrosis, wire loops hyaline thrombi, or cellular crescent formation.  He saw [facility] in 12/2022 and tarpeyo was recommended.  On 04/09/2023 he had labs demonstrating a creatinine of 3.67 and a potassium of 6.3.  In response to that, his lisinopril was stopped per charting.  He had not been using Lokelma and he picked up samples.  Repeat labs done on 6/7 had a creatinine of 3.37 and a potassium of 5.5.  Lokelma dosing was increased to 10 g daily.  On his last outpatient labs: 04/18/23 - Cr 3.55, BUN 46, eGFR 22, K 6.1, bicarb 24.  Note his amlodipine was increased to 10 mg daily after 6/14 visit and per charting next step was to be coreg.  Per the 6/14 labs with hyperkalemia he was to be started on chlorthalidone (however hadn't actually started it yet as just got message today).   Note that in the past he had history of acute HTN/emergency and seizure (vs. Syncope per report?) 03/2021 at [facility].    I spoke with the patient and his wife joined via speakerphone.  She provides the bulk of the history.  I offered to call an Arabic interpreter and he declined; he said that he was comfortable speaking with me in [place].  His headache is getting better.  His BP has gotten better here.  They had been called by [facility] to start chlorthalidone but just got this message today.  His wife is pregnant and due any day now.  His wife would feel most comfortable if he were to stay tonight because we are starting a new medication.  There is family who can take her to the hospital if needed.  He reports constipation as his only other concern.     Intake/Output Summary (Last 24 hours) at 04/23/2023 0735 Last data filed at 04/23/2023 0734 Gross per 24 hour  Intake 580 ml  Output 0 ml  Net  580 ml    Vitals:  Vitals:   04/23/23 0200 04/23/23 0300 04/23/23 0400 04/23/23 0725  BP: (!) 142/92 134/87 125/76 (!) 136/90  Pulse: 92 99 99 (!) 121  Resp:   18 18  Temp:   98.1 F (36.7 C) 97.8 F (36.6 C)  TempSrc:   Oral Oral  SpO2: 96% 96% 96% 97%  Weight:   79.7 kg   Height:         Physical Exam:    General adult male in bed in no acute distress HEENT normocephalic atraumatic extraocular movements intact sclera anicteric Neck supple trachea midline Lungs clear to auscultation bilaterally normal work of breathing at rest on room air  Heart S1S2 no rub Abdomen soft nontender nondistended Extremities no edema  Psych normal mood and affect Neuro - alert and oriented x 3 provides hx and follows commands  Medications reviewed   Labs:     Latest Ref Rng & Units 04/23/2023    1:45 AM 04/22/2023    7:40 AM 04/21/2023    2:45 AM  BMP  Glucose 70 - 99 mg/dL 782  956  213   BUN 6 - 20 mg/dL 40  42  37   Creatinine 0.61 - 1.24 mg/dL 0.86  5.78  4.69   Sodium 135 - 145 mmol/L 140  142  141   Potassium 3.5 - 5.1 mmol/L 4.2  4.1  3.7   Chloride 98 - 111 mmol/L 106  108  107   CO2 22 - 32 mmol/L 22  24  22    Calcium 8.9 - 10.3 mg/dL 8.9  8.7  8.2     Lab trends: per [facility] and primary neph charting:  Creatinine/proteinuria: 04/2021: 1.6 07/13/21: 2.0 09/04/21: 2.07, ACR 2.6 10/18/21: 1.97, ACR 2500 11/30/21: 1.87 10/11/22: 2.79 10/14/22: 2.97, ACR 2.3  04/09/2023 - Cr 3.67 and a potassium of 6.3.   6/7 - Cr 3.37 and a potassium of 5.5 04/18/23 - Cr 3.55, K 6.1, BUN 46   Assessment/Plan:   # AKI  - Setting of hypertensive emergency and failure of autoregulation  - Most recently also s/p hypotensive episode.  Got lasix 20 mg IV on 6/18 but also got a liter of fluids    # CKD stage IV with progressive disease - Secondary to IgA nephropathy  - Follows with Dr. Marisue Humble at [facility] and has seen [facility] (Dr. Elson Clan) for additional opinion  - Please avoid morphine for pain in  light of the AKI/CKD   # IgA nephropathy - As an outpatient is on tarpeyo (budesonide) however note is not on formulary here - Per [facility] the degree of scarring demonstrated indicates that immunosuppression would be of minimal benefit - he is now off of cellcept    # Hypertensive emergency  - Had been on RAAS blockade - this was discontinued recently due to  hyperkalemia  - BP Control is improving - We started coreg 6.25 mg BID on 6/17  - Continue hydralazine at 25 mg TID - Avoid hypotension    # Seizure activity  - Per his wife has happened before with uncontrolled HTN  - Per primary team  - Neurology has been consulted  # Hyperkalemia - resolved per last labs and was secondary to lisinopril in setting of advanced CKD - note he had recurrent hyperkalemia while on low dose RAAS blockade  - has been on daily lokelma outpatient but for just a short time.  This can be held as hyperkalemia has resolved   # Headache - Secondary to hypertensive emergency    # Constipation  - improving  - Ordered miralax daily  Disposition - would continue monitoring today.  If labs improved then he would be acceptable for discharge home as early as tomorrow from a strictly renal standpoint   Estanislado Emms, MD 04/23/2023 8:18 AM

## 2023-04-23 NOTE — Progress Notes (Signed)
PROGRESS NOTE    Gary Cox  ZOX:[id number] DOB: [birth year] DOA: 04/20/2023 PCP: Massie Maroon, FNP  Chief Complaint  Patient presents with   Hypertension    Brief Narrative:   Cross Byington is a 37 y.o. 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  Patient was apparently at a family gathering today when he started having the headaches.  Blood pressure at the time check was 193/123.   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  Multifactorial, unclear if seizures could be triggering this as well -Change amlodipine to verapamil, trial of Fioricet -Blood pressure is much better controlled,  Hypertension -Now better controlled, continue Coreg, hydralazine and change amlodipine to verapamil  AKI on CKD IV  IgA Nephropathy Baseline appears to be around 3.6 -Slightly higher today, likely hemodynamically mediated with mild hypotension yesterday, meds decreased -Appreciate nephrology input, at baseline on  Tarpeyo and Lokelma, Farxiga on hold as well  Tobacco Abuse Encourage cessation   DVT prophylaxis: heparin Code Status: full Family Communication: none Disposition:   Status is: Inpatient Remains inpatient appropriate because: need for inpatient care for hypertensive crisis    Antimicrobials:  Anti-infectives (From admission, onward)    None       Subjective: Complains of headache  Objective: Vitals:   04/23/23 0300 04/23/23 0400 04/23/23 0725 04/23/23 1143  BP: 134/87 125/76  (!) 136/90 (!) 141/89  Pulse: 99 99 (!) 121 98  Resp:  18 18 17   Temp:  98.1 F (36.7 C) 97.8 F (36.6 C) 98.6 F (37 C)  TempSrc:  Oral Oral Oral  SpO2: 96% 96% 97% 96%  Weight:  79.7 kg    Height:        Intake/Output Summary (Last 24 hours) at 04/23/2023 1250 Last data filed at 04/23/2023 1231 Gross per 24 hour  Intake 940 ml  Output 0 ml  Net 940 ml   Filed Weights   04/21/23 0210 04/22/23 0409 04/23/23 0400  Weight: 81.6 kg 79.7 kg 79.7 kg    Examination:  General: Sleeping this morning, AAOx3, no distress Cardiovascular: RRR Lungs: unlabored Abd: s/nt/nd Neurological: Alert and oriented 3. Moves all extremities 4 with equal strength. Cranial nerves II through XII grossly intact. Extremities: No clubbing or cyanosis. No edema.  Data Reviewed: I have personally reviewed following labs and imaging studies  CBC: Recent Labs  Lab 04/20/23 2208 04/21/23 0104 04/21/23 0245 04/22/23 0740 04/23/23 0145  WBC 9.3 9.0 8.4 8.6 13.0*  NEUTROABS 6.0  --   --  4.3 10.0*  HGB 12.8* 12.6* 12.2* 13.3 13.7  HCT 37.9* 36.5* 34.8* 38.4* 39.5  MCV 83.1 84.1 83.9 84.2 84.6  PLT 257 244 239 235 249    Basic Metabolic Panel: Recent Labs  Lab 04/20/23 2208 04/21/23 0104 04/21/23 0245 04/22/23 0740 04/23/23 0145  NA 138  --  141 142 140  K 4.2  --  3.7 4.1 4.2  CL 107  --  107 108 106  CO2 23  --  22 24 22   GLUCOSE 89  --  115* 111* 108*  BUN 40*  --  37* 42* 40*  CREATININE 3.41* 3.22* 3.23* 3.75* 4.12*  CALCIUM 8.4*  --  8.2* 8.7* 8.9  MG  --   --   --  1.9  --   PHOS  --   --  4.4 4.4 4.9*    GFR: Estimated Creatinine Clearance: 25.5 mL/min (A) (by C-G formula based on SCr of 4.12 mg/dL (H)).  Liver Function Tests: Recent Labs  Lab 04/20/23 2208 04/21/23 0245 04/22/23 0740 May 08, 2023 0145  AST 21  --  15  --   ALT 17  --  20  --   ALKPHOS 43  --  42  --   BILITOT 0.7  --  0.7  --   PROT 6.2*  --  5.9*  --   ALBUMIN 2.9* 2.6* 2.8* 2.9*     CBG: Recent Labs  Lab 04/22/23 1340 04/22/23 1724  GLUCAP 165* 180*     No results found for this or any previous visit (from the past 240 hour(s)).       Radiology Studies: EEG adult  Result Date: 2023-05-08 Charlsie Quest, MD     2023-05-08  8:36 AM Patient Name: Gary Cox MRN: [id number] Epilepsy Attending: Charlsie Quest Referring Physician/Provider: Zigmund Daniel., MD Date: 04/22/2023 Duration: 24.20 mins Patient history: 37 year old gentleman with significant seizure risk factor of prior TBI with significant bifrontal cortical injury. EEG to evaluate for seizure Level of alertness: Awake AEDs during EEG study: LEV Technical aspects: This EEG study was done with scalp electrodes positioned according to the 10-20 International system of electrode placement. Electrical activity was reviewed with band pass filter of 1-70Hz , sensitivity of 7 uV/mm, display speed of 23mm/sec with a 60Hz  notched filter applied as appropriate. EEG data were recorded continuously and digitally stored.  Video monitoring was available and reviewed as appropriate. Description: The posterior dominant rhythm consists of 8 Hz activity of moderate voltage (25-35 uV) seen predominantly in posterior head regions, symmetric and reactive to eye opening and eye closing. Frequent spikes were noted in left fronto-centro-temporal region. Physiologic photic driving was not seen during photic stimulation.  Hyperventilation was not performed.     ABNORMALITY -Spike, left fronto-centro-temporal region IMPRESSION: This study showed evidence of epileptogenicity arising from  left fronto-centro-temporal region. No seizures were seen throughout the recording. Charlsie Quest   MR BRAIN WO CONTRAST  Result Date: 04/22/2023 CLINICAL DATA:  Altered mental status EXAM: MRI HEAD WITHOUT CONTRAST TECHNIQUE: Multiplanar, multiecho pulse sequences of the brain and surrounding structures were obtained without intravenous  contrast. COMPARISON:  None Available. FINDINGS: Brain: No acute infarct, mass effect or extra-axial collection. No acute or chronic hemorrhage. 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 CSF spaces are normal. The midline structures are normal. Vascular: Major flow voids are preserved. Skull and upper cervical spine: Normal calvarium and skull base. Visualized upper cervical spine and soft tissues are normal. Sinuses/Orbits:No paranasal sinus fluid levels or advanced mucosal thickening. No mastoid or middle ear effusion. Normal orbits. IMPRESSION: 1. No acute intracranial abnormality. 2. 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 Electronically Signed   By: Deatra Robinson M.D.   On: 04/22/2023 19:54   ECHOCARDIOGRAM COMPLETE  Result Date: 04/22/2023    ECHOCARDIOGRAM REPORT   Patient Name:   Gary Cox Date of Exam: 04/22/2023 Medical Rec #:  [id number]     Height:       67.0 in Accession #:    [id number]    Weight:  175.8 lb Date of Birth:  [birth year]     BSA:          1.914 m Patient Age:    36 years      BP:           166/111 mmHg Patient Gender: M             HR:           92 bpm. Exam Location:  Inpatient Procedure: 2D Echo, Color Doppler and Cardiac Doppler Indications:    R00.8 Other abnormalities of heart  History:        Patient has no prior history of Echocardiogram examinations.                 Risk Factors:Former Smoker. CKD, IgA nephropathy, HTN.  Sonographer:    Milda Smart Referring Phys: [id number] A CALDWELL POWELL JR IMPRESSIONS  1. Left ventricular ejection fraction, by estimation, is 60 to 65%. The left ventricle has normal function. The left ventricle has no regional wall motion abnormalities. Left ventricular diastolic parameters were normal.  2. Right ventricular systolic function is normal. The right ventricular size is normal. Tricuspid regurgitation signal is inadequate for assessing PA pressure.  3. The mitral valve is normal in  structure. No evidence of mitral valve regurgitation. No evidence of mitral stenosis.  4. The aortic valve is normal in structure. Aortic valve regurgitation is not visualized. No aortic stenosis is present.  5. The inferior vena cava is normal in size with greater than 50% respiratory variability, suggesting right atrial pressure of 3 mmHg. FINDINGS  Left Ventricle: Left ventricular ejection fraction, by estimation, is 60 to 65%. The left ventricle has normal function. The left ventricle has no regional wall motion abnormalities. The left ventricular internal cavity size was normal in size. There is  no left ventricular hypertrophy. Left ventricular diastolic parameters were normal. Right Ventricle: The right ventricular size is normal. No increase in right ventricular wall thickness. Right ventricular systolic function is normal. Tricuspid regurgitation signal is inadequate for assessing PA pressure. Left Atrium: Left atrial size was normal in size. Right Atrium: Right atrial size was normal in size. Pericardium: There is no evidence of pericardial effusion. Mitral Valve: The mitral valve is normal in structure. No evidence of mitral valve regurgitation. No evidence of mitral valve stenosis. Tricuspid Valve: The tricuspid valve is normal in structure. Tricuspid valve regurgitation is not demonstrated. No evidence of tricuspid stenosis. Aortic Valve: The aortic valve is normal in structure. Aortic valve regurgitation is not visualized. No aortic stenosis is present. Pulmonic Valve: The pulmonic valve was normal in structure. Pulmonic valve regurgitation is not visualized. No evidence of pulmonic stenosis. Aorta: The aortic root is normal in size and structure. Venous: The inferior vena cava is normal in size with greater than 50% respiratory variability, suggesting right atrial pressure of 3 mmHg. IAS/Shunts: No atrial level shunt detected by color flow Doppler.  LEFT VENTRICLE PLAX 2D LVIDd:         4.20 cm    Diastology LVIDs:         2.80 cm   LV e' medial:    9.79 cm/s LV PW:         0.80 cm   LV E/e' medial:  8.3 LV IVS:        0.90 cm   LV e' lateral:   11.90 cm/s LVOT diam:     2.20 cm   LV E/e' lateral: 6.8 LV SV:  81 LV SV Index:   42 LVOT Area:     3.80 cm  RIGHT VENTRICLE             IVC RV S prime:     12.80 cm/s  IVC diam: 1.20 cm TAPSE (M-mode): 2.1 cm LEFT ATRIUM             Index        RIGHT ATRIUM           Index LA diam:        3.50 cm 1.83 cm/m   RA Area:     11.90 cm LA Vol (A2C):   33.5 ml 17.50 ml/m  RA Volume:   27.10 ml  14.16 ml/m LA Vol (A4C):   23.8 ml 12.43 ml/m LA Biplane Vol: 27.2 ml 14.21 ml/m  AORTIC VALVE LVOT Vmax:   120.00 cm/s LVOT Vmean:  81.700 cm/s LVOT VTI:    0.213 m  AORTA Ao Root diam: 2.70 cm Ao Asc diam:  3.00 cm MITRAL VALVE MV Area (PHT): 4.26 cm    SHUNTS MV Decel Time: 178 msec    Systemic VTI:  0.21 m MV E velocity: 80.90 cm/s  Systemic Diam: 2.20 cm MV A velocity: 55.30 cm/s MV E/A ratio:  1.46 Mihai Croitoru MD Electronically signed by Thurmon Fair MD Signature Date/Time: 04/22/2023/12:24:12 PM    Final         Scheduled Meds:  acetaminophen  1,000 mg Oral Q8H   carvedilol  6.25 mg Oral BID WC   heparin  5,000 Units Subcutaneous Q8H   hydrALAZINE  25 mg Oral Q8H   levETIRAcetam  250 mg Oral BID   naLOXone (NARCAN)  injection  0.4 mg Intravenous Once   polyethylene glycol  17 g Oral Daily   verapamil  120 mg Oral Daily   Continuous Infusions:   LOS: 3 days    Time spent: over 30 min    Zannie Cove, MD [facility]   04/23/2023, 12:50 PM

## 2023-04-23 NOTE — Progress Notes (Signed)
Neurology Progress Note  Patient ID: Gary Cox is a 37 y.o. with PMHx of  has a past medical history of IgA nephropathy (08/26/2017), Renal disorder, and UTI (urinary tract infection).  Subjective: - severe headache w/o photophobia, non-positional, reports typically has headache 1x a month or so  - no other acute complaints, confirms history of events as provided by family, amnestic to events himself  Exam: Vitals:   04/23/23 0400 04/23/23 0725  BP: 125/76 (!) 136/90  Pulse: 99 (!) 121  Resp: 18 18  Temp: 98.1 F (36.7 C) 97.8 F (36.6 C)  SpO2: 96% 97%   Gen: In bed, holding head and uncomfortable   Resp: non-labored breathing, no grossly audible wheezing  Neuro: MS: Awake, alert, appropriately conversant, following simple commands CN: PERRL, EOMI, face symmetric Motor: Using all four extremities purposefully, equally    Pertinent Labs:  Basic Metabolic Panel: Recent Labs  Lab 04/20/23 2208 04/21/23 0104 04/21/23 0245 04/22/23 0740 04/23/23 0145  NA 138  --  141 142 140  K 4.2  --  3.7 4.1 4.2  CL 107  --  107 108 106  CO2 23  --  22 24 22   GLUCOSE 89  --  115* 111* 108*  BUN 40*  --  37* 42* 40*  CREATININE 3.41* 3.22* 3.23* 3.75* 4.12*  CALCIUM 8.4*  --  8.2* 8.7* 8.9  MG  --   --   --  1.9  --   PHOS  --   --  4.4 4.4 4.9*    CBC: Recent Labs  Lab 04/20/23 2208 04/21/23 0104 04/21/23 0245 04/22/23 0740 04/23/23 0145  WBC 9.3 9.0 8.4 8.6 13.0*  NEUTROABS 6.0  --   --  4.3 10.0*  HGB 12.8* 12.6* 12.2* 13.3 13.7  HCT 37.9* 36.5* 34.8* 38.4* 39.5  MCV 83.1 84.1 83.9 84.2 84.6  PLT 257 244 239 235 249    Coagulation Studies: No results for input(s): "LABPROT", "INR" in the last 72 hours.   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 Teresa Coombs placed  - Continue keppra 250 mg BID - seizure precautions reviewed, please include in discharge instructions - Inpatient neurology will be available as needed, please reach out with any additional questions / concerns - recs conveyed to primary team via secure chat   Standard seizure precautions: Per [facility] statutes, patients with seizures are not allowed to drive until  they have been seizure-free for six months. Use caution when using heavy equipment or power tools. Avoid working on ladders or at heights. Take showers instead of baths. Ensure the water temperature is not too high on the home water heater. Do not go swimming alone. When caring for infants or small children, sit down when holding, feeding, or changing them to minimize risk of injury to the child in the event you have a seizure.  To reduce risk of seizures, maintain good sleep hygiene avoid alcohol and illicit drug use, take all anti-seizure medications as prescribed.    Brooke Dare MD-PhD [facility] [phone]  Available 7 AM to 7 PM, outside these hours please contact Neurologist on call listed on AMION

## 2023-04-23 NOTE — Progress Notes (Signed)
Wife addressed concerns in changes in pt's behavior since starting Keppra.  Per wife, pt is "louder", but not aggressive.  Also wife is concerned that patient is having some forgetfulness.  Wife states she will address with MDs in the morning as well.  Overall, pt seems relaxed and non-aggressive at this time, but this RN will continue to monitor for further personality changes.   04/23/23 1942  Psychosocial  Psychosocial (WDL) X  Patient Behaviors Appropriate for age;Other (Comment) (per wife, pt is "louder" and seemingly forgetful)  Needs Expressed Physical  Psychosocial Additional Assessments Family behavior  Family Behavior Appropriate for situation  Emotional support given Given to patient;Given to patient's family

## 2023-04-24 DIAGNOSIS — I160 Hypertensive urgency: Secondary | ICD-10-CM | POA: Diagnosis not present

## 2023-04-24 LAB — COMPREHENSIVE METABOLIC PANEL
ALT: 24 U/L (ref 0–44)
AST: 27 U/L (ref 15–41)
Albumin: 2.7 g/dL — ABNORMAL LOW (ref 3.5–5.0)
Alkaline Phosphatase: 41 U/L (ref 38–126)
Anion gap: 11 (ref 5–15)
BUN: 49 mg/dL — ABNORMAL HIGH (ref 6–20)
CO2: 23 mmol/L (ref 22–32)
Calcium: 8.6 mg/dL — ABNORMAL LOW (ref 8.9–10.3)
Chloride: 106 mmol/L (ref 98–111)
Creatinine, Ser: 4.36 mg/dL — ABNORMAL HIGH (ref 0.61–1.24)
GFR, Estimated: 17 mL/min — ABNORMAL LOW (ref >60)
Glucose, Bld: 129 mg/dL — ABNORMAL HIGH (ref 70–99)
Potassium: 3.9 mmol/L (ref 3.5–5.1)
Sodium: 140 mmol/L (ref 135–145)
Total Bilirubin: 0.5 mg/dL (ref 0.3–1.2)
Total Protein: 5.8 g/dL — ABNORMAL LOW (ref 6.5–8.1)

## 2023-04-24 LAB — CBC
HCT: 37.4 % — ABNORMAL LOW (ref 39.0–52.0)
Hemoglobin: 13 g/dL (ref 13.0–17.0)
MCH: 29.5 pg (ref 26.0–34.0)
MCHC: 34.8 g/dL (ref 30.0–36.0)
MCV: 84.8 fL (ref 80.0–100.0)
Platelets: 247 10*3/uL (ref 150–400)
RBC: 4.41 MIL/uL (ref 4.22–5.81)
RDW: 12.5 % (ref 11.5–15.5)
WBC: 10.3 10*3/uL (ref 4.0–10.5)
nRBC: 0 % (ref 0.0–0.2)

## 2023-04-24 MED ORDER — HYDRALAZINE HCL 25 MG PO TABS
25.0000 mg | ORAL_TABLET | Freq: Two times a day (BID) | ORAL | Status: DC
  Administered 2023-04-24 – 2023-04-25 (×2): 25 mg via ORAL
  Filled 2023-04-24 (×2): qty 1

## 2023-04-24 NOTE — Progress Notes (Signed)
PROGRESS NOTE    [name]  ZOX:[id number] DOB: July 11, 1986 DOA: 04/20/2023 PCP: Massie Maroon, FNP   Brief Narrative:  Dray Granier is a 37 y.o. 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  Patient was apparently at a family gathering today when he started having the headaches.  Blood pressure at the time check was 193/123.   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  Continues to have elevated creatinine levels 6/20.   Assessment & Plan:   Principal Problem:   Hypertensive urgency Active Problems:   Acute renal failure (ARF) (HCC)   Chronic IgA nephropathy   Tobacco dependence   Headache   Localization-related (focal) (partial) idiopathic epilepsy and epileptic syndromes with seizures of localized onset, not intractable, without status epilepticus (HCC)  Assessment and Plan:   Focal seizures, epilepsy -Likely related to prior TBI -EEG abnormal, noted spike in the left frontocentral temporal region indicating evidence of epileptogenicity -MRI brain with bifrontal encephalomalacia -Neurology consulting, started on Keppra -Referral sent to [facility]   Severe headaches-improved -?  Multifactorial, unclear if seizures could be triggering this as well -Change amlodipine to verapamil, trial of Fioricet -Blood pressure is much better controlled,   Hypertension -Now better controlled, continue Coreg, hydralazine and change amlodipine to verapamil -Hydralazine dose decreased per nephrology due to persistent creatinine elevation   AKI on CKD IV  IgA Nephropathy Baseline appears to be around 3.6 -Slightly higher today, likely hemodynamically mediated with mild hypotension yesterday, meds decreased -Appreciate nephrology input, at baseline  on  Tarpeyo and Lokelma, Farxiga on hold as well -Hydralazine dose decreased as noted above -Continue to monitor a.m. labs   Tobacco Abuse Encourage cessation     DVT prophylaxis:Heparin Code Status: Full Family Communication: Discussed with wife on phone 6/20 Disposition Plan:  Status is: Inpatient Remains inpatient appropriate because: Need for ongoing monitoring and IV medications.   Consultants:  Neurology Nephrology  Procedures:  None  Antimicrobials:  None   Subjective: Patient seen and evaluated today with no further headaches or seizure activity noted overnight.  Creatinine continues to remain elevated.  Objective: Vitals:   04/24/23 0304 04/24/23 0740 04/24/23 0755 04/24/23 0758  BP: 117/67 133/87 138/85   Pulse:  84  93  Resp: 18 18    Temp: 97.9 F (36.6 C) 97.9 F (36.6 C)    TempSrc: Oral Oral    SpO2: 97%     Weight: 79.6 kg     Height:        Intake/Output Summary (Last 24 hours) at 04/24/2023 1056 Last data filed at 04/24/2023 1053 Gross per 24 hour  Intake 700 ml  Output --  Net 700 ml   Filed Weights   04/22/23 0409 04/23/23 0400 04/24/23 0304  Weight: 79.7 kg 79.7 kg 79.6 kg    Examination:  General exam: Appears calm and comfortable  Respiratory system: Clear to auscultation. Respiratory effort normal. Cardiovascular system: S1 & S2 heard, RRR.  Gastrointestinal system: Abdomen is soft Central nervous system: Alert and awake Extremities: No edema Skin: No significant lesions noted Psychiatry: Flat affect.    Data Reviewed: I have personally reviewed following labs and imaging studies  CBC: Recent Labs  Lab 04/20/23 2208 04/21/23 0104 04/21/23 0245 04/22/23 0740 04/23/23 0145 04/24/23 0101  WBC 9.3 9.0 8.4 8.6  13.0* 10.3  NEUTROABS 6.0  --   --  4.3 10.0*  --   HGB 12.8* 12.6* 12.2* 13.3 13.7 13.0  HCT 37.9* 36.5* 34.8* 38.4* 39.5 37.4*  MCV 83.1 84.1 83.9 84.2 84.6 84.8  PLT 257 244 239 235 249 247   Basic  Metabolic Panel: Recent Labs  Lab 04/20/23 2208 04/21/23 0104 04/21/23 0245 04/22/23 0740 04/23/23 0145 04/24/23 0101  NA 138  --  141 142 140 140  K 4.2  --  3.7 4.1 4.2 3.9  CL 107  --  107 108 106 106  CO2 23  --  22 24 22 23   GLUCOSE 89  --  115* 111* 108* 129*  BUN 40*  --  37* 42* 40* 49*  CREATININE 3.41* 3.22* 3.23* 3.75* 4.12* 4.36*  CALCIUM 8.4*  --  8.2* 8.7* 8.9 8.6*  MG  --   --   --  1.9  --   --   PHOS  --   --  4.4 4.4 4.9*  --    GFR: Estimated Creatinine Clearance: 24.1 mL/min (A) (by C-G formula based on SCr of 4.36 mg/dL (H)). Liver Function Tests: Recent Labs  Lab 04/20/23 2208 04/21/23 0245 04/22/23 0740 04/23/23 0145 04/24/23 0101  AST 21  --  15  --  27  ALT 17  --  20  --  24  ALKPHOS 43  --  42  --  41  BILITOT 0.7  --  0.7  --  0.5  PROT 6.2*  --  5.9*  --  5.8*  ALBUMIN 2.9* 2.6* 2.8* 2.9* 2.7*   No results for input(s): "LIPASE", "AMYLASE" in the last 168 hours. No results for input(s): "AMMONIA" in the last 168 hours. Coagulation Profile: No results for input(s): "INR", "PROTIME" in the last 168 hours. Cardiac Enzymes: No results for input(s): "CKTOTAL", "CKMB", "CKMBINDEX", "TROPONINI" in the last 168 hours. BNP (last 3 results) No results for input(s): "PROBNP" in the last 8760 hours. HbA1C: No results for input(s): "HGBA1C" in the last 72 hours. CBG: Recent Labs  Lab 04/22/23 1340 04/22/23 1724  GLUCAP 165* 180*   Lipid Profile: No results for input(s): "CHOL", "HDL", "LDLCALC", "TRIG", "CHOLHDL", "LDLDIRECT" in the last 72 hours. Thyroid Function Tests: No results for input(s): "TSH", "T4TOTAL", "FREET4", "T3FREE", "THYROIDAB" in the last 72 hours. Anemia Panel: No results for input(s): "VITAMINB12", "FOLATE", "FERRITIN", "TIBC", "IRON", "RETICCTPCT" in the last 72 hours. Sepsis Labs: No results for input(s): "PROCALCITON", "LATICACIDVEN" in the last 168 hours.  No results found for this or any previous visit (from the  past 240 hour(s)).       Radiology Studies: DG Abd 1 View  Result Date: 04/23/2023 CLINICAL DATA:  Constipation EXAM: ABDOMEN - 1 VIEW COMPARISON:  None Available. FINDINGS: Bowel gas pattern is nonspecific. Small amount of stool is seen in colon. There is no fecal impaction in rectum. No abnormal masses or calcifications are seen. Kidneys are partly obscured by bowel contents. IMPRESSION: Nonspecific bowel gas pattern. Electronically Signed   By: Ernie Avena M.D.   On: 04/23/2023 20:18   EEG adult  Result Date: 04/23/2023 Charlsie Quest, MD     04/23/2023  8:36 AM Patient Name: Antavius Buracker MRN: [id number] Epilepsy Attending: Charlsie Quest Referring Physician/Provider: Zigmund Daniel., MD Date: 04/22/2023 Duration: 24.20 mins Patient history: 37 year old gentleman with significant seizure risk factor of prior TBI with significant bifrontal cortical injury. EEG to evaluate for seizure Level of alertness:  Awake AEDs during EEG study: LEV Technical aspects: This EEG study was done with scalp electrodes positioned according to the 10-20 International system of electrode placement. Electrical activity was reviewed with band pass filter of 1-70Hz , sensitivity of 7 uV/mm, display speed of 11mm/sec with a 60Hz  notched filter applied as appropriate. EEG data were recorded continuously and digitally stored.  Video monitoring was available and reviewed as appropriate. Description: The posterior dominant rhythm consists of 8 Hz activity of moderate voltage (25-35 uV) seen predominantly in posterior head regions, symmetric and reactive to eye opening and eye closing. Frequent spikes were noted in left fronto-centro-temporal region. Physiologic photic driving was not seen during photic stimulation.  Hyperventilation was not performed.     ABNORMALITY -Spike, left fronto-centro-temporal region IMPRESSION: This study showed evidence of epileptogenicity arising from  left fronto-centro-temporal  region. No seizures were seen throughout the recording. Charlsie Quest   MR BRAIN WO CONTRAST  Result Date: 04/22/2023 CLINICAL DATA:  Altered mental status EXAM: MRI HEAD WITHOUT CONTRAST TECHNIQUE: Multiplanar, multiecho pulse sequences of the brain and surrounding structures were obtained without intravenous contrast. COMPARISON:  None Available. FINDINGS: Brain: No acute infarct, mass effect or extra-axial collection. No acute or chronic hemorrhage. 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 CSF spaces are normal. The midline structures are normal. Vascular: Major flow voids are preserved. Skull and upper cervical spine: Normal calvarium and skull base. Visualized upper cervical spine and soft tissues are normal. Sinuses/Orbits:No paranasal sinus fluid levels or advanced mucosal thickening. No mastoid or middle ear effusion. Normal orbits. IMPRESSION: 1. No acute intracranial abnormality. 2. 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 Electronically Signed   By: Deatra Robinson M.D.   On: 04/22/2023 19:54   ECHOCARDIOGRAM COMPLETE  Result Date: 04/22/2023    ECHOCARDIOGRAM REPORT   Patient Name:   RAFID WATT Date of Exam: 04/22/2023 Medical Rec #:  [id number]     Height:       67.0 in Accession #:    [id number]    Weight:       175.8 lb Date of Birth:  03-29-1986     BSA:          1.914 m Patient Age:    36 years      BP:           166/111 mmHg Patient Gender: M             HR:           92 bpm. Exam Location:  Inpatient Procedure: 2D Echo, Color Doppler and Cardiac Doppler Indications:    R00.8 Other abnormalities of heart  History:        Patient has no prior history of Echocardiogram examinations.                 Risk Factors:Former Smoker. CKD, IgA nephropathy, HTN.  Sonographer:    Milda Smart Referring Phys: [phone] A CALDWELL POWELL JR IMPRESSIONS  1. Left ventricular ejection fraction, by estimation, is 60 to 65%. The left ventricle has normal  function. The left ventricle has no regional wall motion abnormalities. Left ventricular diastolic parameters were normal.  2. Right ventricular systolic function is normal. The right ventricular size is normal. Tricuspid regurgitation signal is inadequate for assessing PA pressure.  3. The mitral valve is normal in structure. No evidence of mitral valve regurgitation. No evidence of mitral stenosis.  4. The aortic valve is normal in structure. Aortic valve regurgitation is not visualized. No  aortic stenosis is present.  5. The inferior vena cava is normal in size with greater than 50% respiratory variability, suggesting right atrial pressure of 3 mmHg. FINDINGS  Left Ventricle: Left ventricular ejection fraction, by estimation, is 60 to 65%. The left ventricle has normal function. The left ventricle has no regional wall motion abnormalities. The left ventricular internal cavity size was normal in size. There is  no left ventricular hypertrophy. Left ventricular diastolic parameters were normal. Right Ventricle: The right ventricular size is normal. No increase in right ventricular wall thickness. Right ventricular systolic function is normal. Tricuspid regurgitation signal is inadequate for assessing PA pressure. Left Atrium: Left atrial size was normal in size. Right Atrium: Right atrial size was normal in size. Pericardium: There is no evidence of pericardial effusion. Mitral Valve: The mitral valve is normal in structure. No evidence of mitral valve regurgitation. No evidence of mitral valve stenosis. Tricuspid Valve: The tricuspid valve is normal in structure. Tricuspid valve regurgitation is not demonstrated. No evidence of tricuspid stenosis. Aortic Valve: The aortic valve is normal in structure. Aortic valve regurgitation is not visualized. No aortic stenosis is present. Pulmonic Valve: The pulmonic valve was normal in structure. Pulmonic valve regurgitation is not visualized. No evidence of pulmonic  stenosis. Aorta: The aortic root is normal in size and structure. Venous: The inferior vena cava is normal in size with greater than 50% respiratory variability, suggesting right atrial pressure of 3 mmHg. IAS/Shunts: No atrial level shunt detected by color flow Doppler.  LEFT VENTRICLE PLAX 2D LVIDd:         4.20 cm   Diastology LVIDs:         2.80 cm   LV e' medial:    9.79 cm/s LV PW:         0.80 cm   LV E/e' medial:  8.3 LV IVS:        0.90 cm   LV e' lateral:   11.90 cm/s LVOT diam:     2.20 cm   LV E/e' lateral: 6.8 LV SV:         81 LV SV Index:   42 LVOT Area:     3.80 cm  RIGHT VENTRICLE             IVC RV S prime:     12.80 cm/s  IVC diam: 1.20 cm TAPSE (M-mode): 2.1 cm LEFT ATRIUM             Index        RIGHT ATRIUM           Index LA diam:        3.50 cm 1.83 cm/m   RA Area:     11.90 cm LA Vol (A2C):   33.5 ml 17.50 ml/m  RA Volume:   27.10 ml  14.16 ml/m LA Vol (A4C):   23.8 ml 12.43 ml/m LA Biplane Vol: 27.2 ml 14.21 ml/m  AORTIC VALVE LVOT Vmax:   120.00 cm/s LVOT Vmean:  81.700 cm/s LVOT VTI:    0.213 m  AORTA Ao Root diam: 2.70 cm Ao Asc diam:  3.00 cm MITRAL VALVE MV Area (PHT): 4.26 cm    SHUNTS MV Decel Time: 178 msec    Systemic VTI:  0.21 m MV E velocity: 80.90 cm/s  Systemic Diam: 2.20 cm MV A velocity: 55.30 cm/s MV E/A ratio:  1.46 Mihai Croitoru MD Electronically signed by Thurmon Fair MD Signature Date/Time: 04/22/2023/12:24:12 PM    Final  Scheduled Meds:  acetaminophen  1,000 mg Oral Q8H   carvedilol  6.25 mg Oral BID WC   heparin  5,000 Units Subcutaneous Q8H   hydrALAZINE  25 mg Oral BID   levETIRAcetam  250 mg Oral BID   naLOXone (NARCAN)  injection  0.4 mg Intravenous Once   polyethylene glycol  17 g Oral Daily   verapamil  120 mg Oral Daily     LOS: 4 days    Time spent: 35 minutes    Jex Strausbaugh Hoover Brunette, DO [facility]  If 7PM-7AM, please contact night-coverage [email or web address] 04/24/2023, 10:56 AM

## 2023-04-24 NOTE — Progress Notes (Signed)
Ativan IV 2MG /1ML wasted with Karie Kirks, RRT nurse during an RRT in pt's room on 6/18

## 2023-04-24 NOTE — Progress Notes (Signed)
[facility] Progress Note  Name: Gary Cox MRN: [id number] DOB: January 07, 1986  Chief Complaint:  Headache and HTN   Subjective:  Strict ins/outs are not available.  He had one unmeasured urine void over 6/19.  I have asked him to try and make sure not to discard urine and reinforced with nursing.    He feels better today.  His headache is gone.  His wife joined via speakerphone.  She asked if a family friend who is a doctor could interpret for the patient so that he could understand in the language he is most comfortable with - he had previously declined an interpreter for me.  We got an arabic video interpreter and discussed his AKI, CKD.  They asked about referral for kidney transplant and I let them know that this is considered an acute event but if GFR persistently less than 20 then a referral for transplant would be indicated.  His wife shared that he didn't quite seem like himself yesterday and she asked about the seizure medications and she wonders if it's the meds - I asked for her to reach out to his primary team or neurology for more guidance about his options.   Review of systems:  Had a BM charted yesterday Denies shortness of breath or chest pain  Denies n/v Denies headache today - this is better  ----------------- Background on referral:  Gary Cox is a 37 y.o. male with a history including CKD, IgA nephropathy, HTN, recurrent UTI, and tobacco abuse who presented to the hospital with considerable headache.  He was found to be markedly hypertensive.  Blood pressure on initial check was 193/123 per charting.  Nephrology is consulted for assistance with management of CKD and hypertension.  He has been on [place] and until recently had been on CellCept and lisinopril 10 mg daily.  He was recently on Lokelma.  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  Note SGLT2i and ACEi, started around 2022.  He was previously on prednisone from 09/04/21 - 03/2022 per charting.  Later he got another biopsy on 11/07/22 which demonstrated severe chronic injury with no glomerular activity.  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 No mesangial expansion or hypercellularity and no endocapillary hypercellularity, fibrinoid necrosis, wire loops hyaline thrombi, or cellular crescent formation.  He saw [facility] in 12/2022 and tarpeyo was recommended.  On 04/09/2023 he had labs demonstrating a creatinine of 3.67 and a potassium of 6.3.  In response to that, his lisinopril was stopped per charting.  He had not been using Lokelma and he picked up samples.  Repeat labs done on 6/7 had a creatinine of 3.37 and a potassium of 5.5.  Lokelma dosing was increased to 10 g daily.  On his last outpatient labs: 04/18/23 - Cr 3.55, BUN 46, eGFR 22, K 6.1, bicarb 24.  Note his amlodipine was increased to 10 mg daily after 6/14 visit and per charting next step was to be coreg.  Per the 6/14 labs with hyperkalemia he was to be started on chlorthalidone (however hadn't actually started it yet as just got message today).   Note that in the past he had history of acute HTN/emergency and seizure (vs. Syncope per report?) 03/2021 at [facility].    I spoke with the patient and his wife joined via speakerphone.  She provides the bulk of the history.  I offered to call an Arabic interpreter and he declined; he said that he was comfortable speaking with me in [place].  His headache is getting better.  His BP has gotten better here.  They had been called by [facility] to start chlorthalidone but just got this message today.  His wife is pregnant and due any day now.  His wife would feel most comfortable if he were to stay tonight because we are starting a new medication.  There is family who can take her to the hospital if needed.  He reports  constipation as his only other concern.     Intake/Output Summary (Last 24 hours) at 04/24/2023 0955 Last data filed at 04/23/2023 1231 Gross per 24 hour  Intake 360 ml  Output --  Net 360 ml    Vitals:  Vitals:   04/24/23 0304 04/24/23 0740 04/24/23 0755 04/24/23 0758  BP: 117/67 133/87 138/85   Pulse:  84  93  Resp: 18 18    Temp: 97.9 F (36.6 C) 97.9 F (36.6 C)    TempSrc: Oral Oral    SpO2: 97%     Weight: 79.6 kg     Height:         Physical Exam:     General adult male in bed in no acute distress HEENT normocephalic atraumatic extraocular movements intact sclera anicteric Neck supple trachea midline Lungs clear to auscultation bilaterally normal work of breathing at rest on room air  Heart S1S2 no rub Abdomen soft nontender nondistended Extremities no edema  Psych normal mood and affect Neuro - alert and oriented x 3 provides hx and follows commands  Medications reviewed   Labs:     Latest Ref Rng & Units 04/24/2023    1:01 AM 04/23/2023    1:45 AM 04/22/2023    7:40 AM  BMP  Glucose 70 - 99 mg/dL 914  782  956   BUN 6 - 20 mg/dL 49  40  42   Creatinine 0.61 - 1.24 mg/dL 2.13  0.86  5.78   Sodium 135 - 145 mmol/L 140  140  142   Potassium 3.5 - 5.1 mmol/L 3.9  4.2  4.1   Chloride 98 - 111 mmol/L 106  106  108   CO2 22 - 32 mmol/L 23  22  24    Calcium 8.9 - 10.3 mg/dL 8.6  8.9  8.7     Lab trends: per [facility] and primary neph charting:  Creatinine/proteinuria: 04/2021: 1.6 07/13/21: 2.0 09/04/21: 2.07, ACR 2.6 10/18/21: 1.97, ACR 2500 11/30/21: 1.87 10/11/22: 2.79 10/14/22: 2.97, ACR 2.3  04/09/2023 - Cr 3.67 and a potassium of 6.3.   6/7 - Cr 3.37 and a potassium of 5.5 04/18/23 - Cr 3.55, K 6.1, BUN 46   Assessment/Plan:   # AKI  - Setting of hypertensive emergency and failure of autoregulation.  Now with worsening after his hypotensive episode.  Hopeful for creatinine plateau.  Note he got lasix 20 mg IV on 6/18 but also got a liter of fluids so less  contribution from any prerenal insult  - I have lowered the dose of hydralazine to 25 mg BID - I have removed his current fluid restriction for now  - Reinforced with pt and staff the need for strict ins/outs   # CKD stage IV with progressive disease - Secondary to IgA nephropathy  - Follows with [facility]. Marisue Humble at [facility] and has seen  [facility] (Dr. Elson Clan) for additional opinion  - Please avoid morphine for pain in light of the AKI/CKD   # IgA nephropathy - As an outpatient is on tarpeyo (budesonide) however note is not on formulary here - Per [facility] the degree of scarring demonstrated indicates that immunosuppression would be of minimal benefit - he is now off of cellcept    # Hypertensive emergency  - Had been on RAAS blockade - this was discontinued recently due to hyperkalemia.  He has had headache this admission and also had a seizure  - BP Control is improving - We started coreg 6.25 mg BID on 6/17  - Hydralazine was also started on 6/17 at 25 mg TID.  He got one dose of 50 mg and then was reduced back to 25 mg - Reduce hydralazine to 25 mg BID  - Avoid hypotension    # Seizure activity  - Per his wife has happened before with uncontrolled HTN  - Per primary team  - Neurology has been consulted   # Hyperkalemia - resolved and was secondary to lisinopril in setting of advanced CKD - note he had recurrent hyperkalemia while on low dose RAAS blockade  - lokelma has been discontinued (was a new med and no longer needed)   # Headache - Secondary to hypertensive emergency    # Constipation  - improving. Ordered miralax daily  Disposition - would continue monitoring today.  If labs improved then he would be acceptable for discharge home as early as tomorrow from a strictly renal standpoint   Estanislado Emms, MD 04/24/2023 10:49 AM

## 2023-04-25 DIAGNOSIS — I160 Hypertensive urgency: Secondary | ICD-10-CM | POA: Diagnosis not present

## 2023-04-25 LAB — CBC
HCT: 37.5 % — ABNORMAL LOW (ref 39.0–52.0)
Hemoglobin: 12.7 g/dL — ABNORMAL LOW (ref 13.0–17.0)
MCH: 28.2 pg (ref 26.0–34.0)
MCHC: 33.9 g/dL (ref 30.0–36.0)
MCV: 83.3 fL (ref 80.0–100.0)
Platelets: 240 10*3/uL (ref 150–400)
RBC: 4.5 MIL/uL (ref 4.22–5.81)
RDW: 12.5 % (ref 11.5–15.5)
WBC: 8.2 10*3/uL (ref 4.0–10.5)
nRBC: 0 % (ref 0.0–0.2)

## 2023-04-25 LAB — BASIC METABOLIC PANEL
Anion gap: 10 (ref 5–15)
BUN: 51 mg/dL — ABNORMAL HIGH (ref 6–20)
CO2: 22 mmol/L (ref 22–32)
Calcium: 8.6 mg/dL — ABNORMAL LOW (ref 8.9–10.3)
Chloride: 106 mmol/L (ref 98–111)
Creatinine, Ser: 4.13 mg/dL — ABNORMAL HIGH (ref 0.61–1.24)
GFR, Estimated: 18 mL/min — ABNORMAL LOW (ref >60)
Glucose, Bld: 108 mg/dL — ABNORMAL HIGH (ref 70–99)
Potassium: 4.2 mmol/L (ref 3.5–5.1)
Sodium: 138 mmol/L (ref 135–145)

## 2023-04-25 LAB — MAGNESIUM: Magnesium: 2.2 mg/dL (ref 1.7–2.4)

## 2023-04-25 MED ORDER — BUTALBITAL-APAP-CAFFEINE 50-325-40 MG PO TABS: 1.0000 | ORAL_TABLET | Freq: Four times a day (QID) | ORAL | 0 refills | Status: DC | PRN

## 2023-04-25 MED ORDER — CALCIUM CARBONATE ANTACID 1250 MG/5ML PO SUSP: 500.0000 mg | Freq: Four times a day (QID) | ORAL | 0 refills | Status: AC | PRN

## 2023-04-25 MED ORDER — CARVEDILOL 6.25 MG PO TABS: 6.2500 mg | ORAL_TABLET | Freq: Two times a day (BID) | ORAL | 1 refills | Status: DC

## 2023-04-25 MED ORDER — LEVETIRACETAM 250 MG PO TABS: 250.0000 mg | ORAL_TABLET | Freq: Two times a day (BID) | ORAL | 1 refills | Status: DC

## 2023-04-25 MED ORDER — NEPRO/CARBSTEADY PO LIQD: 237.0000 mL | Freq: Three times a day (TID) | ORAL | 0 refills | Status: DC | PRN

## 2023-04-25 MED ORDER — VERAPAMIL HCL ER 120 MG PO TBCR: 120.0000 mg | EXTENDED_RELEASE_TABLET | Freq: Every day | ORAL | 1 refills | Status: AC

## 2023-04-25 MED ORDER — HYDRALAZINE HCL 25 MG PO TABS: 25.0000 mg | ORAL_TABLET | Freq: Two times a day (BID) | ORAL | 1 refills | Status: DC

## 2023-04-25 NOTE — TOC Transition Note (Signed)
Transition of Care [facility]) - CM/SW Discharge Note   Patient Details  Name: Gary Cox MRN: [id number] Date of Birth: Aug 13, 1986  [facility] CM/SW Contact:  Leone Haven, RN Phone Number: 04/25/2023, 10:02 AM   Clinical Narrative:    Patient is for dc today, he has no needs. Wife will transport him home.    Final next level of care: Home/Self Care Barriers to Discharge: Continued Medical Work up   Patient Goals and CMS Choice   Choice offered to / list presented to : NA  Discharge Placement                         Discharge Plan and Services Additional resources added to the After Visit Summary for   In-house Referral: NA Discharge Planning Services: CM Consult Post Acute Care Choice: NA          DME Arranged: N/A DME Agency: NA       HH Arranged: NA          Social Determinants of Health (SDOH) Interventions SDOH Screenings   Food Insecurity: No Food Insecurity (04/21/2023)  Housing: Low Risk  (04/21/2023)  Transportation Needs: No Transportation Needs (04/21/2023)  Utilities: Not At Risk (04/21/2023)  Tobacco Use: Medium Risk (04/20/2023)     Readmission Risk Interventions     No data to display

## 2023-04-25 NOTE — Progress Notes (Signed)
Pt care has been taken over by myself and report given to me by RN Melanie Pt has telesitter at the bedside  He is sleeping at this time Pt has no s/s or c/o sob/dyspnea No s/s or c/o pain or discomfort No n/v/d to note at this time Pt callbell/belongings are within reach Bed in proper position for safety Will continue to monitor for changes in status

## 2023-04-25 NOTE — Progress Notes (Signed)
[facility] NEPHROLOGY PROGRESS NOTE  Assessment/ Plan: Pt is a 37 y.o. yo male with history of IgA nephropathy, hypertension, CKD presented with headache and hypertensive urgency.  # Acute kidney injury on CKD stage IV presumably due to hypertensive emergency/hemodynamic change with correction of blood pressure.  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 Also received a dose of Lasix which is off now. Patient is nonoliguric and has no signs or symptoms of uremia.  Creatinine level is trending down nicely.  No need for dialysis.  Blood pressure seems to be well controlled on current treatment regimen.  Recommend to follow-up with Dr. Marisue Humble after discharge from the hospital.  # Hypertensive urgency/emergency: Blood pressure is improving on current regimen including carvedilol, hydralazine, verapamil.  Monitor BP.  # Hyperkalemia: Off of ACE inhibitor.  Recommend low potassium diet.  Sign off, please call us back with question.  Discussed with the primary team and of the patient.  Subjective: Seen and examined at bedside.  Patient reports feeling fatigued.  Denies nausea, vomiting, dysgeusia, chest pain or shortness of breath.  Blood pressure seems to be better controlled.  Urine output is recorded around a liter. Objective Vital signs in last 24 hours: Vitals:   04/24/23 2017 04/25/23 0039 04/25/23 0534 04/25/23 0725  BP: (!) 128/94 115/82 132/78 (!) 143/99  Pulse: 84 80 78 86  Resp: 17 18 18 18   Temp: 98.2 F (36.8 C) 98 F (36.7 C) 97.9 F (36.6 C) 97.7 F (36.5 C)  TempSrc: Oral Oral Oral Oral  SpO2: 97% 96% 97% 97%  Weight:      Height:       Weight change:   Intake/Output Summary (Last 24 hours) at 04/25/2023 0857 Last data filed at 04/25/2023 0830 Gross per 24 hour  Intake 580 ml  Output 965 ml  Net -385 ml       Labs: RENAL PANEL Recent Labs  Lab 04/20/23 2208 04/21/23 0104 04/21/23 0245 04/22/23 0740 04/23/23 0145 04/24/23 0101 04/25/23 0112  NA 138   --  141 142 140 140 138  K 4.2  --  3.7 4.1 4.2 3.9 4.2  CL 107  --  107 108 106 106 106  CO2 23  --  22 24 22 23 22   GLUCOSE 89  --  115* 111* 108* 129* 108*  BUN 40*  --  37* 42* 40* 49* 51*  CREATININE 3.41*   < > 3.23* 3.75* 4.12* 4.36* 4.13*  CALCIUM 8.4*  --  8.2* 8.7* 8.9 8.6* 8.6*  MG  --   --   --  1.9  --   --  2.2  PHOS  --   --  4.4 4.4 4.9*  --   --   ALBUMIN 2.9*  --  2.6* 2.8* 2.9* 2.7*  --    < > = values in this interval not displayed.    Liver Function Tests: Recent Labs  Lab 04/20/23 2208 04/21/23 0245 04/22/23 0740 04/23/23 0145 04/24/23 0101  AST 21  --  15  --  27  ALT 17  --  20  --  24  ALKPHOS 43  --  42  --  41  BILITOT 0.7  --  0.7  --  0.5  PROT 6.2*  --  5.9*  --  5.8*  ALBUMIN 2.9*   < > 2.8* 2.9* 2.7*   < > = values in this interval not displayed.   No results for input(s): "LIPASE", "AMYLASE" in the last  168 hours. No results for input(s): "AMMONIA" in the last 168 hours. CBC: Recent Labs    04/21/23 0245 04/22/23 0740 04/23/23 0145 04/24/23 0101 04/25/23 0112  HGB 12.2* 13.3 13.7 13.0 12.7*  MCV 83.9 84.2 84.6 84.8 83.3    Cardiac Enzymes: No results for input(s): "CKTOTAL", "CKMB", "CKMBINDEX", "TROPONINI" in the last 168 hours. CBG: Recent Labs  Lab 04/22/23 1340 04/22/23 1724  GLUCAP 165* 180*    Iron Studies: No results for input(s): "IRON", "TIBC", "TRANSFERRIN", "FERRITIN" in the last 72 hours. Studies/Results: DG Abd 1 View  Result Date: 04/23/2023 CLINICAL DATA:  Constipation EXAM: ABDOMEN - 1 VIEW COMPARISON:  None Available. FINDINGS: Bowel gas pattern is nonspecific. Small amount of stool is seen in colon. There is no fecal impaction in rectum. No abnormal masses or calcifications are seen. Kidneys are partly obscured by bowel contents. IMPRESSION: Nonspecific bowel gas pattern. Electronically Signed   By: Ernie Avena M.D.   On: 04/23/2023 20:18    Medications: Infusions:   Scheduled Medications:   carvedilol  6.25 mg Oral BID WC   heparin  5,000 Units Subcutaneous Q8H   hydrALAZINE  25 mg Oral BID   levETIRAcetam  250 mg Oral BID   polyethylene glycol  17 g Oral Daily   verapamil  120 mg Oral Daily    have reviewed scheduled and prn medications.  Physical Exam: General:NAD, comfortable Heart:RRR, s1s2 nl Lungs:clear b/l, no crackle Abdomen:soft, Non-tender, non-distended Extremities:No edema Neurology: Alert awake and following commands.  Charlii Yost Prasad Alcus Bradly 04/25/2023,8:57 AM  LOS: 5 days

## 2023-04-25 NOTE — Discharge Summary (Signed)
Physician Discharge Summary  Gary Cox ZOX:[id number] DOB: 05-26-1986 DOA: 04/20/2023  PCP: Massie Maroon, FNP  Admit date: 04/20/2023  Discharge date: 04/25/2023  Admitted From:Home  Disposition:  Home  Recommendations for Outpatient Follow-up:  Follow up with PCP in 1-2 weeks Follow up with Neurology Dr. Teresa Coombs with referral sent Continue Keppra BID and Fioricet as needed for headache Continue BP medications as noted below and follow up with Nephrology Dr. Marisue Humble as scheduled.  Home Health:None  Equipment/Devices:None  Discharge Condition:Stable  CODE STATUS: Full  Diet recommendation: Heart Healthy  Brief/Interim Summary: Gary Cox is a 37 y.o. 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  Patient was apparently at a family gathering today when he started having the headaches.  Blood pressure at the time check was 193/123.   He was admitted for evaluation of focal seizures/epilepsy and was seen by neurology and started on Keppra twice daily.  He was developing headaches and was given migraine cocktail as well as Fioricet and has now been started on verapamil.  During the course of his stay, he developed acute kidney injury on CKD stage IV related to hypertensive emergency and hemodynamic change.  With correction of his blood pressure, his creatinine levels have trended in the right direction and there has been no need for hemodialysis per nephrology.  He is no longer symptomatic and is in stable condition for discharge with close nephrology and neurology follow-up planned.  No other acute events or concerns noted throughout the course of the stay.  Discharge Diagnoses:  Principal Problem:   Hypertensive urgency Active Problems:   Acute renal failure (ARF) (HCC)   Chronic IgA nephropathy   Tobacco  dependence   Headache   Localization-related (focal) (partial) idiopathic epilepsy and epileptic syndromes with seizures of localized onset, not intractable, without status epilepticus (HCC)  Principal discharge diagnosis: Headache in the setting of hypertensive urgency/emergency with associated AKI on CKD stage IV.  Seizures.  Discharge Instructions  Discharge Instructions     Ambulatory referral to Neurology   Complete by: As directed    An appointment is requested in approximately: 4 weeks - 8 weeks   Diet - low sodium heart healthy   Complete by: As directed    Increase activity slowly   Complete by: As directed       Allergies as of 04/25/2023   No Known Allergies      Medication List     STOP taking these medications    amLODipine 10 MG tablet Commonly known as: NORVASC   lisinopril 10 MG tablet Commonly known as: ZESTRIL       TAKE these medications    butalbital-acetaminophen-caffeine 50-325-40 MG tablet Commonly known as: FIORICET Take 1 tablet by mouth every 6 (six) hours as needed for headache.   calcium carbonate (dosed in mg elemental calcium) 1250 MG/5ML Susp Take 5 mLs (500 mg of elemental calcium total) by mouth every 6 (six) hours as needed for indigestion.   carvedilol 6.25 MG tablet Commonly known as: COREG Take 1 tablet (6.25 mg total) by mouth 2 (two) times daily with a meal.   dapagliflozin propanediol 10 MG Tabs tablet Commonly known as: FARXIGA Take 10 mg by mouth in the morning.   feeding supplement (NEPRO CARB STEADY) Liqd Take 237 mLs by mouth 3 (three) times daily as needed (Supplement).   hydrALAZINE 25 MG  tablet Commonly known as: APRESOLINE Take 1 tablet (25 mg total) by mouth 2 (two) times daily.   levETIRAcetam 250 MG tablet Commonly known as: KEPPRA Take 1 tablet (250 mg total) by mouth 2 (two) times daily.   Lokelma 5 g packet Generic drug: sodium zirconium cyclosilicate Take 1 packet by mouth daily before  breakfast.   mycophenolate 500 MG tablet Commonly known as: CELLCEPT Take 500 mg by mouth 2 (two) times daily.   Tarpeyo 4 MG Cpdr Generic drug: Budesonide Take 4 mg by mouth daily with breakfast.   verapamil 120 MG CR tablet Commonly known as: CALAN-SR Take 1 tablet (120 mg total) by mouth daily. Start taking on: April 26, 2023        Follow-up Information     [facility]. Go on 05/05/2023.   Specialty: Internal Medicine Why: @2 :20pm Contact information: [address] [place] [address] [phone]               No Known Allergies  Consultations: Nephrology Neurology   Procedures/Studies: DG Abd 1 View  Result Date: 04/23/2023 CLINICAL DATA:  Constipation EXAM: ABDOMEN - 1 VIEW COMPARISON:  None Available. FINDINGS: Bowel gas pattern is nonspecific. Small amount of stool is seen in colon. There is no fecal impaction in rectum. No abnormal masses or calcifications are seen. Kidneys are partly obscured by bowel contents. IMPRESSION: Nonspecific bowel gas pattern. Electronically Signed   By: Ernie Avena M.D.   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 EEG to evaluate for seizure Level of alertness: Awake AEDs during EEG study: LEV Technical aspects: This EEG study was done with scalp electrodes positioned according to the 10-20 International system of electrode placement. Electrical activity was reviewed with band pass filter of 1-70Hz , sensitivity of 7 uV/mm, display speed of 59mm/sec with a 60Hz  notched filter applied as appropriate. EEG data were recorded  continuously and digitally stored.  Video monitoring was available and reviewed as appropriate. Description: The posterior dominant rhythm consists of 8 Hz activity of moderate voltage (25-35 uV) seen predominantly in posterior head regions, symmetric and reactive to eye opening and eye closing. Frequent spikes were noted in left fronto-centro-temporal region. Physiologic photic driving was not seen during photic stimulation.  Hyperventilation was not performed.     ABNORMALITY -Spike, left fronto-centro-temporal region IMPRESSION: This study showed evidence of epileptogenicity arising from  left fronto-centro-temporal region. No seizures were seen throughout the recording. Charlsie Quest   MR BRAIN WO CONTRAST  Result Date: 04/22/2023 CLINICAL DATA:  Altered mental status EXAM: MRI HEAD WITHOUT CONTRAST TECHNIQUE: Multiplanar, multiecho pulse sequences of the brain and surrounding structures were obtained without intravenous contrast. COMPARISON:  None Available. FINDINGS: Brain: No acute infarct, mass effect or extra-axial collection. No acute or chronic hemorrhage. 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 CSF spaces are normal. The midline structures are normal. Vascular: Major flow voids are preserved. Skull and upper cervical spine: Normal calvarium and skull base. Visualized upper cervical spine and soft tissues are normal. Sinuses/Orbits:No paranasal sinus fluid levels or advanced mucosal thickening. No mastoid or middle ear effusion. Normal orbits. IMPRESSION: 1. No acute  intracranial abnormality. 2. 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 Electronically Signed   By: Deatra Robinson M.D.   On: 04/22/2023 19:54   ECHOCARDIOGRAM COMPLETE  Result Date: 04/22/2023    ECHOCARDIOGRAM REPORT   Patient Name:   Gary Cox Date of Exam: 04/22/2023 Medical Rec #:  [id number]     Height:       67.0 in Accession #:    [id number]    Weight:       175.8 lb Date of  Birth:  [birth year]     BSA:          1.914 m Patient Age:    36 years      BP:           166/111 mmHg Patient Gender: M             HR:           92 bpm. Exam Location:  Inpatient Procedure: 2D Echo, Color Doppler and Cardiac Doppler Indications:    R00.8 Other abnormalities of heart  History:        Patient has no prior history of Echocardiogram examinations.                 Risk Factors:Former Smoker. CKD, IgA nephropathy, HTN.  Sonographer:    Milda Smart Referring Phys: [phone] A CALDWELL POWELL JR IMPRESSIONS  1. Left ventricular ejection fraction, by estimation, is 60 to 65%. The left ventricle has normal function. The left ventricle has no regional wall motion abnormalities. Left ventricular diastolic parameters were normal.  2. Right ventricular systolic function is normal. The right ventricular size is normal. Tricuspid regurgitation signal is inadequate for assessing PA pressure.  3. The mitral valve is normal in structure. No evidence of mitral valve regurgitation. No evidence of mitral stenosis.  4. The aortic valve is normal in structure. Aortic valve regurgitation is not visualized. No aortic stenosis is present.  5. The inferior vena cava is normal in size with greater than 50% respiratory variability, suggesting right atrial pressure of 3 mmHg. FINDINGS  Left Ventricle: Left ventricular ejection fraction, by estimation, is 60 to 65%. The left ventricle has normal function. The left ventricle has no regional wall motion abnormalities. The left ventricular internal cavity size was normal in size. There is  no left ventricular hypertrophy. Left ventricular diastolic parameters were normal. Right Ventricle: The right ventricular size is normal. No increase in right ventricular wall thickness. Right ventricular systolic function is normal. Tricuspid regurgitation signal is inadequate for assessing PA pressure. Left Atrium: Left atrial size was normal in size. Right Atrium: Right atrial size was normal  in size. Pericardium: There is no evidence of pericardial effusion. Mitral Valve: The mitral valve is normal in structure. No evidence of mitral valve regurgitation. No evidence of mitral valve stenosis. Tricuspid Valve: The tricuspid valve is normal in structure. Tricuspid valve regurgitation is not demonstrated. No evidence of tricuspid stenosis. Aortic Valve: The aortic valve is normal in structure. Aortic valve regurgitation is not visualized. No aortic stenosis is present. Pulmonic Valve: The pulmonic valve was normal in structure. Pulmonic valve regurgitation is not visualized. No evidence of pulmonic stenosis. Aorta: The aortic root is normal in size and structure. Venous: The inferior vena cava is normal in size with greater than 50% respiratory variability, suggesting right atrial pressure of 3 mmHg. IAS/Shunts: No atrial level shunt detected by color flow Doppler.  LEFT VENTRICLE PLAX 2D LVIDd:  4.20 cm   Diastology LVIDs:         2.80 cm   LV e' medial:    9.79 cm/s LV PW:         0.80 cm   LV E/e' medial:  8.3 LV IVS:        0.90 cm   LV e' lateral:   11.90 cm/s LVOT diam:     2.20 cm   LV E/e' lateral: 6.8 LV SV:         81 LV SV Index:   42 LVOT Area:     3.80 cm  RIGHT VENTRICLE             IVC RV S prime:     12.80 cm/s  IVC diam: 1.20 cm TAPSE (M-mode): 2.1 cm LEFT ATRIUM             Index        RIGHT ATRIUM           Index LA diam:        3.50 cm 1.83 cm/m   RA Area:     11.90 cm LA Vol (A2C):   33.5 ml 17.50 ml/m  RA Volume:   27.10 ml  14.16 ml/m LA Vol (A4C):   23.8 ml 12.43 ml/m LA Biplane Vol: 27.2 ml 14.21 ml/m  AORTIC VALVE LVOT Vmax:   120.00 cm/s LVOT Vmean:  81.700 cm/s LVOT VTI:    0.213 m  AORTA Ao Root diam: 2.70 cm Ao Asc diam:  3.00 cm MITRAL VALVE MV Area (PHT): 4.26 cm    SHUNTS MV Decel Time: 178 msec    Systemic VTI:  0.21 m MV E velocity: 80.90 cm/s  Systemic Diam: 2.20 cm MV A velocity: 55.30 cm/s MV E/A ratio:  1.46 Mihai Croitoru MD Electronically signed by  Thurmon Fair MD Signature Date/Time: 04/22/2023/12:24:12 PM    Final    CT Head Wo Contrast  Result Date: 04/21/2023 CLINICAL DATA:  Headache EXAM: CT HEAD WITHOUT CONTRAST TECHNIQUE: Contiguous axial images were obtained from the base of the skull through the vertex without intravenous contrast. RADIATION DOSE REDUCTION: This exam was performed according to the departmental dose-optimization program which includes automated exposure control, adjustment of the mA and/or kV according to patient size and/or use of iterative reconstruction technique. COMPARISON:  None Available. FINDINGS: Brain: No evidence of acute infarction, hemorrhage, mass, mass effect, or midline shift. No hydrocephalus or extra-axial fluid collection. 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 Vascular: No hyperdense vessel. Skull: Negative for fracture or focal lesion. Sinuses/Orbits: Mucosal thickening in the right maxillary sinus. No acute finding in the orbits. Other: The mastoid air cells are well aerated. IMPRESSION: 1. No acute intracranial process. 2. 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 Electronically Signed   By: Wiliam Ke M.D.   On: 04/21/2023 01:04     Discharge Exam: Vitals:   04/25/23 0534 04/25/23 0725  BP: 132/78 (!) 143/99  Pulse: 78 86  Resp: 18 18  Temp: 97.9 F (36.6 C) 97.7 F (36.5 C)  SpO2: 97% 97%   Vitals:   04/24/23 2017 04/25/23 0039 04/25/23 0534 04/25/23 0725  BP: (!) 128/94 115/82 132/78 (!) 143/99  Pulse: 84 80 78 86  Resp: 17 18 18 18   Temp: 98.2 F (36.8 C) 98 F (36.7 C) 97.9 F (  36.6 C) 97.7 F (36.5 C)  TempSrc: Oral Oral Oral Oral  SpO2: 97% 96% 97% 97%  Weight:      Height:        General: Pt is alert, awake, not in acute distress Cardiovascular: RRR, S1/S2 +, no rubs, no  gallops Respiratory: CTA bilaterally, no wheezing, no rhonchi Abdominal: Soft, NT, ND, bowel sounds + Extremities: no edema, no cyanosis    The results of significant diagnostics from this hospitalization (including imaging, microbiology, ancillary and laboratory) are listed below for reference.     Microbiology: No results found for this or any previous visit (from the past 240 hour(s)).   Labs: BNP (last 3 results) No results for input(s): "BNP" in the last 8760 hours. Basic Metabolic Panel: Recent Labs  Lab 04/21/23 0245 04/22/23 0740 04/23/23 0145 04/24/23 0101 04/25/23 0112  NA 141 142 140 140 138  K 3.7 4.1 4.2 3.9 4.2  CL 107 108 106 106 106  CO2 22 24 22 23 22   GLUCOSE 115* 111* 108* 129* 108*  BUN 37* 42* 40* 49* 51*  CREATININE 3.23* 3.75* 4.12* 4.36* 4.13*  CALCIUM 8.2* 8.7* 8.9 8.6* 8.6*  MG  --  1.9  --   --  2.2  PHOS 4.4 4.4 4.9*  --   --    Liver Function Tests: Recent Labs  Lab 04/20/23 2208 04/21/23 0245 04/22/23 0740 04/23/23 0145 04/24/23 0101  AST 21  --  15  --  27  ALT 17  --  20  --  24  ALKPHOS 43  --  42  --  41  BILITOT 0.7  --  0.7  --  0.5  PROT 6.2*  --  5.9*  --  5.8*  ALBUMIN 2.9* 2.6* 2.8* 2.9* 2.7*   No results for input(s): "LIPASE", "AMYLASE" in the last 168 hours. No results for input(s): "AMMONIA" in the last 168 hours. CBC: Recent Labs  Lab 04/20/23 2208 04/21/23 0104 04/21/23 0245 04/22/23 0740 04/23/23 0145 04/24/23 0101 04/25/23 0112  WBC 9.3   < > 8.4 8.6 13.0* 10.3 8.2  NEUTROABS 6.0  --   --  4.3 10.0*  --   --   HGB 12.8*   < > 12.2* 13.3 13.7 13.0 12.7*  HCT 37.9*   < > 34.8* 38.4* 39.5 37.4* 37.5*  MCV 83.1   < > 83.9 84.2 84.6 84.8 83.3  PLT 257   < > 239 235 249 247 240   < > = values in this interval not displayed.   Cardiac Enzymes: No results for input(s): "CKTOTAL", "CKMB", "CKMBINDEX", "TROPONINI" in the last 168 hours. BNP: Invalid input(s): "POCBNP" CBG: Recent Labs  Lab 04/22/23 1340  04/22/23 1724  GLUCAP 165* 180*   D-Dimer No results for input(s): "DDIMER" in the last 72 hours. Hgb A1c No results for input(s): "HGBA1C" in the last 72 hours. Lipid Profile No results for input(s): "CHOL", "HDL", "LDLCALC", "TRIG", "CHOLHDL", "LDLDIRECT" in the last 72 hours. Thyroid function studies No results for input(s): "TSH", "T4TOTAL", "T3FREE", "THYROIDAB" in the last 72 hours.  Invalid input(s): "FREET3" Anemia work up No results for input(s): "VITAMINB12", "FOLATE", "FERRITIN", "TIBC", "IRON", "RETICCTPCT" in the last 72 hours. Urinalysis    Component Value Date/Time   COLORURINE STRAW (A) 04/21/2023 1112   APPEARANCEUR CLEAR 04/21/2023 1112   LABSPEC 1.008 04/21/2023 1112   PHURINE 5.0 04/21/2023 1112   GLUCOSEU >=500 (A) 04/21/2023 1112   HGBUR LARGE (A) 04/21/2023 1112   BILIRUBINUR  NEGATIVE 04/21/2023 1112   BILIRUBINUR negative 02/20/2016 1551   KETONESUR NEGATIVE 04/21/2023 1112   PROTEINUR >=300 (A) 04/21/2023 1112   UROBILINOGEN 0.2 11/27/2016 0927   NITRITE NEGATIVE 04/21/2023 1112   LEUKOCYTESUR NEGATIVE 04/21/2023 1112   Sepsis Labs Recent Labs  Lab 04/22/23 0740 04/23/23 0145 04/24/23 0101 04/25/23 0112  WBC 8.6 13.0* 10.3 8.2   Microbiology No results found for this or any previous visit (from the past 240 hour(s)).   Time coordinating discharge: 35 minutes  SIGNED:   Erick Blinks, DO [facility] 04/25/2023, 9:34 AM  If 7PM-7AM, please contact night-coverage [email or web address]

## 2023-04-30 ENCOUNTER — Emergency Department (HOSPITAL_BASED_OUTPATIENT_CLINIC_OR_DEPARTMENT_OTHER)
Admission: EM | Admit: 2023-04-30 | Discharge: 2023-04-30 | Disposition: A | Discharge status: Home / Self Care | Payer: Medicaid Other | Attending: Emergency Medicine | Admitting: Emergency Medicine

## 2023-04-30 ENCOUNTER — Other Ambulatory Visit: Payer: Self-pay

## 2023-04-30 ENCOUNTER — Encounter (HOSPITAL_BASED_OUTPATIENT_CLINIC_OR_DEPARTMENT_OTHER): Payer: Self-pay

## 2023-04-30 DIAGNOSIS — N189 Chronic kidney disease, unspecified: Secondary | ICD-10-CM | POA: Diagnosis not present

## 2023-04-30 DIAGNOSIS — R799 Abnormal finding of blood chemistry, unspecified: Secondary | ICD-10-CM | POA: Diagnosis present

## 2023-04-30 DIAGNOSIS — R899 Unspecified abnormal finding in specimens from other organs, systems and tissues: Secondary | ICD-10-CM

## 2023-04-30 LAB — COMPREHENSIVE METABOLIC PANEL
ALT: 47 U/L — ABNORMAL HIGH (ref 0–44)
AST: 19 U/L (ref 15–41)
Albumin: 3.7 g/dL (ref 3.5–5.0)
Alkaline Phosphatase: 52 U/L (ref 38–126)
Anion gap: 12 (ref 5–15)
BUN: 61 mg/dL — ABNORMAL HIGH (ref 6–20)
CO2: 24 mmol/L (ref 22–32)
Calcium: 8.9 mg/dL (ref 8.9–10.3)
Chloride: 104 mmol/L (ref 98–111)
Creatinine, Ser: 3.96 mg/dL — ABNORMAL HIGH (ref 0.61–1.24)
GFR, Estimated: 19 mL/min — ABNORMAL LOW (ref >60)
Glucose, Bld: 123 mg/dL — ABNORMAL HIGH (ref 70–99)
Potassium: 4 mmol/L (ref 3.5–5.1)
Sodium: 140 mmol/L (ref 135–145)
Total Bilirubin: 0.3 mg/dL (ref 0.3–1.2)
Total Protein: 6.8 g/dL (ref 6.5–8.1)

## 2023-04-30 NOTE — ED Provider Notes (Signed)
[place] EMERGENCY DEPARTMENT AT [facility] Provider Note   CSN: [id number] Arrival date & time: 04/30/23  1123     History  Chief Complaint  Patient presents with   Abnormal Lab    Gary Cox is a 37 y.o. 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  Patient presents to ED for evaluation of abnormal labs, potassium of 6.9.  The patient reports that he was recently hospitalized.  On chart review, it appears the patient was hospitalized from 6/16 to 6/21 secondary to hypertensive emergency/urgency and AKI.  Patient was discharged on 6/21 with a plan to follow-up with neurology, referral to Dr. Teresa Coombs sent.  Patient here today stating that he had labs drawn recently which showed potassium 6.9, he was advised to come to the ED by a Dr. Verna Czech.  On my chart review, the patient does not have any lab studies that reveal a potassium of 6.9.  Both labs at [facility] as well as care everywhere checked.  The patient had labs drawn on 6/21, 6/20, 6/19, 6/18, 6/17, 6/16.  None of these labs show a potassium of 6.9.  Patient denies any symptoms today.  He denies chest pain, shortness of breath, nausea, vomiting, diarrhea, one-sided weakness or numbness, generalized weakness, muscle pains or cramps, palpitations.  He states he was advised to come here because of his high potassium.   Abnormal Lab      Home Medications Prior to Admission medications   Medication Sig Start Date End Date Taking? Authorizing Provider  butalbital-acetaminophen-caffeine (FIORICET) 50-325-40 MG tablet Take 1 tablet by mouth every 6 (six) hours as needed for headache. 04/25/23   Sherryll Burger, Pratik D, DO  Calcium Carbonate Antacid (CALCIUM CARBONATE, DOSED IN MG ELEMENTAL CALCIUM,) 1250 MG/5ML SUSP Take 5 mLs (500 mg of elemental calcium total) by mouth every 6 (six) hours as needed for indigestion. 04/25/23 05/25/23  Sherryll Burger, Pratik D, DO  carvedilol (COREG) 6.25 MG  tablet Take 1 tablet (6.25 mg total) by mouth 2 (two) times daily with a meal. 04/25/23 06/24/23  Sherryll Burger, Pratik D, DO  dapagliflozin propanediol (FARXIGA) 10 MG TABS tablet Take 10 mg by mouth in the morning.    [provider]  hydrALAZINE (APRESOLINE) 25 MG tablet Take 1 tablet (25 mg total) by mouth 2 (two) times daily. 04/25/23 06/24/23  Sherryll Burger, Pratik D, DO  levETIRAcetam (KEPPRA) 250 MG tablet Take 1 tablet (250 mg total) by mouth 2 (two) times daily. 04/25/23 06/24/23  Sherryll Burger, Pratik D, DO  LOKELMA 5 g packet Take 1 packet by mouth daily before breakfast. 10/15/22   [provider]  Nutritional Supplements (FEEDING SUPPLEMENT, NEPRO CARB STEADY,) LIQD Take 237 mLs by mouth 3 (three) times daily as needed (Supplement). 04/25/23   Sherryll Burger, Pratik D, DO  TARPEYO 4 MG CPDR Take 4 mg by mouth daily with breakfast. 04/08/23   [provider]  verapamil (CALAN-SR) 120 MG CR tablet Take 1 tablet (120 mg total) by mouth daily. 04/26/23 06/25/23  Maurilio Lovely D, DO      Allergies    Patient has no known allergies.    Review of Systems   Review of Systems  All other systems reviewed and are negative.   Physical Exam Updated Vital Signs BP 116/86 (BP Location: Left Arm)   Pulse 78   Temp 98 F (36.7 C) (Oral)   Resp 16   Ht 5\' 7"  (1.702 m)   Wt 79.6 kg  SpO2 99%   BMI 27.49 kg/m  Physical Exam Vitals and nursing note reviewed.  Constitutional:      General: He is not in acute distress.    Appearance: He is well-developed.  HENT:     Head: Normocephalic and atraumatic.  Eyes:     Conjunctiva/sclera: Conjunctivae normal.  Cardiovascular:     Rate and Rhythm: Normal rate and regular rhythm.     Heart sounds: No murmur heard. Pulmonary:     Effort: Pulmonary effort is normal. No respiratory distress.     Breath sounds: Normal breath sounds.  Abdominal:     Palpations: Abdomen is soft.     Tenderness: There is no abdominal tenderness.  Musculoskeletal:        General:  No swelling.     Cervical back: Neck supple.  Skin:    General: Skin is warm and dry.     Capillary Refill: Capillary refill takes less than 2 seconds.  Neurological:     Mental Status: He is alert.  Psychiatric:        Mood and Affect: Mood normal.     ED Results / Procedures / Treatments   Labs (all labs ordered are listed, but only abnormal results are displayed) Labs Reviewed  COMPREHENSIVE METABOLIC PANEL - Abnormal; Notable for the following components:      Result Value   Glucose, Bld 123 (*)    BUN 61 (*)    Creatinine, Ser 3.96 (*)    ALT 47 (*)    GFR, Estimated 19 (*)    All other components within normal limits    EKG EKG Interpretation  Date/Time:  Wednesday April 30 2023 11:39:11 EDT Ventricular Rate:  76 PR Interval:  146 QRS Duration: 94 QT Interval:  372 QTC Calculation: 418 R Axis:   32 Text Interpretation: Normal sinus rhythm Nonspecific T wave abnormality Abnormal ECG When compared with ECG of 21-Apr-2023 08:44, Inverted T waves have replaced nonspecific T wave abnormality in Inferior leads Nonspecific T wave abnormality now evident in Lateral leads when compared to prior, new t wave inversionsin leads, 3, AVF, and V3. no STEMI Confirmed by Theda Belfast ([id number]) on 04/30/2023 11:46:32 AM  Radiology No results found.  Procedures Procedures   Medications Ordered in ED Medications - No data to display  ED Course/ Medical Decision Making/ A&P  Medical Decision Making Amount and/or Complexity of Data Reviewed Labs: ordered.   37 year old no presents to the ED for evaluation of abnormal lab.  Please see HPI for further details.  On examination patient afebrile and nontachycardic.  His lung sounds are clear bilaterally and he is not hypoxic.  His abdomen is soft and compressible.  Neurological examination at baseline.  Blood pressure well-controlled at 116/86.  Patient denies any symptoms of hyperkalemia.  Labs collected.  Patient potassium here  4.0.  Patient creatinine 3.96 which is downtrending from previous creatinines collected during hospitalization.  The patient GFR is 19.  His EKG does not show peaked T waves.  At this time, patient does not have hyperkalemia.  I am unable to find any record of patient having potassium 6.9 during my chart review or through CareEverywhere.  The patient will be discharged at this time and advised to follow back up with his PCP and nephrology.  He was advised to return to the ED with any new or worsening signs or symptoms and he voiced understanding, instructions.  He had all questions answered to his satisfaction.  He is stable  to discharge at this time.   Final Clinical Impression(s) / ED Diagnoses Final diagnoses:  Abnormal laboratory test result    Rx / DC Orders ED Discharge Orders     None         Al Decant, PA-C 04/30/23 1313    Tegeler, Canary Brim, MD 04/30/23 1517

## 2023-04-30 NOTE — Discharge Instructions (Signed)
Return to the ED with any new or worsening signs or symptoms Please follow back up with your PCP and nephrology for further management

## 2023-04-30 NOTE — ED Triage Notes (Signed)
Patient arrives with complaints of abnormal labs. Patient had labs drawn for Stage 4 kidney disease and his potassium was ~6.9. Reports no other symptoms.

## 2023-04-30 NOTE — ED Notes (Signed)
Pt w/o complaints of pain or illness; states PCP requested he come here for evaluation d/t abnormal potassium levels in PCPs office.

## 2023-05-05 ENCOUNTER — Encounter: Payer: Self-pay | Admitting: Nurse Practitioner

## 2023-05-05 ENCOUNTER — Ambulatory Visit: Payer: Medicaid Other | Admitting: Nurse Practitioner

## 2023-05-05 VITALS — BP 148/83 | HR 97 | Temp 97.9°F | Ht 67.0 in | Wt 178.4 lb

## 2023-05-05 DIAGNOSIS — N02B9 Other recurrent and persistent immunoglobulin A nephropathy: Secondary | ICD-10-CM | POA: Diagnosis not present

## 2023-05-05 DIAGNOSIS — R739 Hyperglycemia, unspecified: Secondary | ICD-10-CM | POA: Diagnosis not present

## 2023-05-05 DIAGNOSIS — R519 Headache, unspecified: Secondary | ICD-10-CM

## 2023-05-05 DIAGNOSIS — I160 Hypertensive urgency: Secondary | ICD-10-CM

## 2023-05-05 DIAGNOSIS — G40009 Localization-related (focal) (partial) idiopathic epilepsy and epileptic syndromes with seizures of localized onset, not intractable, without status epilepticus: Secondary | ICD-10-CM

## 2023-05-05 DIAGNOSIS — Z09 Encounter for follow-up examination after completed treatment for conditions other than malignant neoplasm: Secondary | ICD-10-CM | POA: Insufficient documentation

## 2023-05-05 LAB — POCT GLYCOSYLATED HEMOGLOBIN (HGB A1C): Hemoglobin A1C: 5.6 % (ref 4.0–5.6)

## 2023-05-05 NOTE — Assessment & Plan Note (Addendum)
Currently denies headache Continue verapamil 120 mg daily Fioricet 1 tablet every 6 hours as needed

## 2023-05-05 NOTE — Assessment & Plan Note (Signed)
No seizure or headache since he left the hospital Continue Keppra to 50 mg twice daily Verapamil 120 mg daily Follow-up as planned with neurologist

## 2023-05-05 NOTE — Assessment & Plan Note (Signed)
BP Readings from Last 3 Encounters:  05/05/23 (!) 148/83  04/30/23 116/86  04/25/23 (!) 143/99  Continue carvedilol 6.25 mg twice daily, hydralazine 25 mg twice daily Verapamil 120 mg daily Maintain close follow-up with nephrologist Continue current medications. No changes in management. Discussed DASH diet and dietary sodium restrictions Continue to increase dietary efforts and exercise.

## 2023-05-05 NOTE — Patient Instructions (Signed)
Please maintain close follow-up with the neurologist, nephrologist, and your primary care physician.    It is important that you exercise regularly at least 30 minutes 5 times a week as tolerated  Think about what you will eat, plan ahead. Choose " clean, green, fresh or frozen" over canned, processed or packaged foods which are more sugary, salty and fatty. 70 to 75% of food eaten should be vegetables and fruit. Three meals at set times with snacks allowed between meals, but they must be fruit or vegetables. Aim to eat over a 12 hour period , example 7 am to 7 pm, and STOP after  your last meal of the day. Drink water,generally about 64 ounces per day, no other drink is as healthy. Fruit juice is best enjoyed in a healthy way, by EATING the fruit.  Thanks for choosing [facility] we consider it a privelige to serve you.

## 2023-05-05 NOTE — Assessment & Plan Note (Addendum)
He is currently established with nephrologist Plans on getting a kidney transplant Patient encouraged to maintain close follow-up with nephrologist Avoid NSAIDs and other nephrotoxic agents On Farxiga 10 mg daily Takes leukemia 5 mg daily Tarpeyo 4mg  daily Encouraged to drink at least 64 ounces of water daily to maintain hydration

## 2023-05-05 NOTE — Assessment & Plan Note (Signed)
Hospital discharge summary, labs and imaging studies reviewed Patient encouraged to follow-up with neurology Maintain close follow-up with nephrologist

## 2023-05-05 NOTE — Assessment & Plan Note (Signed)
Lab Results  Component Value Date   HGBA1C 5.6 05/05/2023

## 2023-05-05 NOTE — Progress Notes (Signed)
New Patient Office Visit  Subjective:  Patient ID: Gary Cox, male    DOB: 11/17/1985  Age: 37 y.o. MRN: [id number]  CC:  Chief Complaint  Patient presents with   Follow-up    Hospital follow up.    HPI Gary Cox is a 37 y.o. male  has a past medical history of IgA nephropathy (08/26/2017), Renal disorder, and UTI (urinary tract infection).  Current patient of Dr.Bouska  At [facility] in [place].   Patient presents for hospital discharge follow-up.  Patient was on admission at the hospital from 04/20/2023 to 04/25/2023 for hypertensive urgency, Localization-related (focal) (partial) idiopathic epilepsy and epileptic syndromes with seizures of localized onset, not intractable, without status epilepticus (HCC) .  Patient was started on Keppra twice daily.,  Fioricet as needed and verapamil.  He is also followed by nephrology for his chronic kidney disease.  He has been maintaining close follow-up with nephrologist.  Has upcoming appointment with neurology.  He denies headache, seizures since he left the hospital.  He has not been able to drive since his last admission.  He plans to following up with his PCP as needed.  They requested for an A1c today due to his hyperglycemia.     Past Medical History:  Diagnosis Date   IgA nephropathy 08/26/2017   Renal disorder    UTI (urinary tract infection)     Past Surgical History:  Procedure Laterality Date   CYST REMOVAL NECK      Family History  Problem Relation Age of Onset   Hypertension Mother    Hypertension Father     Social History   Socioeconomic History   Marital status: Married    Spouse name: Not on file   Number of children: Not on file   Years of education: Not on file   Highest education level: Not on file  Occupational History   Not on file  Tobacco Use   Smoking status: Former    Packs/day: 1.00    Years: 3.00    Additional pack years: 0.00     Total pack years: 3.00    Types: Cigarettes    Quit date: 08/27/2015    Years since quitting: 7.6   Smokeless tobacco: Never  Vaping Use   Vaping Use: Never used  Substance and Sexual Activity   Alcohol use: No   Drug use: No   Sexual activity: Yes  Other Topics Concern   Not on file  Social History Narrative   ** Merged History Encounter **       Social Determinants of Health   Financial Resource Strain: Not on file  Food Insecurity: No Food Insecurity (04/21/2023)   Hunger Vital Sign    Worried About Running Out of Food in the Last Year: Never true    Ran Out of Food in the Last Year: Never true  Transportation Needs: No Transportation Needs (04/21/2023)   PRAPARE - Administrator, Civil Service (Medical): No    Lack of Transportation (Non-Medical): No  Physical Activity: Not on file  Stress: Not on file  Social Connections: Not on file  Intimate Partner Violence: Not At Risk (04/21/2023)   Humiliation, Afraid, Rape, and Kick questionnaire    Fear of Current or Ex-Partner: No    Emotionally Abused: No    Physically Abused: No    Sexually Abused: No    ROS Review of Systems  Constitutional:  Negative for activity change, appetite  change, chills, diaphoresis, fatigue, fever and unexpected weight change.  HENT:  Negative for congestion, dental problem, drooling and ear discharge.   Eyes:  Negative for pain, discharge, redness and itching.  Respiratory:  Negative for apnea, cough, choking, chest tightness, shortness of breath and wheezing.   Cardiovascular: Negative.  Negative for chest pain, palpitations and leg swelling.  Gastrointestinal:  Negative for abdominal distention, abdominal pain, anal bleeding, blood in stool, constipation, diarrhea and vomiting.  Endocrine: Negative for polydipsia, polyphagia and polyuria.  Genitourinary:  Negative for difficulty urinating, flank pain, frequency and genital sores.  Musculoskeletal: Negative.  Negative for  arthralgias, back pain, gait problem and joint swelling.  Skin:  Negative for color change, pallor and rash.  Neurological:  Negative for dizziness, facial asymmetry, light-headedness, numbness and headaches.  Psychiatric/Behavioral:  Negative for agitation, behavioral problems, confusion, hallucinations, self-injury, sleep disturbance and suicidal ideas.     Objective:   Today's Vitals: BP (!) 148/83   Pulse 97   Temp 97.9 F (36.6 C)   Ht 5\' 7"  (1.702 m)   Wt 178 lb 6.4 oz (80.9 kg)   SpO2 98%   BMI 27.94 kg/m   Physical Exam Vitals and nursing note reviewed.  Constitutional:      General: He is not in acute distress.    Appearance: Normal appearance. He is not ill-appearing, toxic-appearing or diaphoretic.  HENT:     Mouth/Throat:     Mouth: Mucous membranes are moist.     Pharynx: Oropharynx is clear. No oropharyngeal exudate or posterior oropharyngeal erythema.  Eyes:     General: No scleral icterus.       Right eye: No discharge.        Left eye: No discharge.     Extraocular Movements: Extraocular movements intact.     Conjunctiva/sclera: Conjunctivae normal.  Cardiovascular:     Rate and Rhythm: Normal rate and regular rhythm.     Pulses: Normal pulses.     Heart sounds: Normal heart sounds. No murmur heard.    No friction rub. No gallop.  Pulmonary:     Effort: Pulmonary effort is normal. No respiratory distress.     Breath sounds: Normal breath sounds. No stridor. No wheezing, rhonchi or rales.  Chest:     Chest wall: No tenderness.  Abdominal:     General: There is no distension.     Palpations: Abdomen is soft.     Tenderness: There is no abdominal tenderness. There is no right CVA tenderness, left CVA tenderness or guarding.  Musculoskeletal:        General: No swelling, tenderness, deformity or signs of injury.     Right lower leg: No edema.     Left lower leg: No edema.  Skin:    General: Skin is warm and dry.     Capillary Refill: Capillary refill  takes less than 2 seconds.     Coloration: Skin is not jaundiced or pale.     Findings: No bruising, erythema or lesion.  Neurological:     Mental Status: He is alert and oriented to person, place, and time.     Motor: No weakness.     Coordination: Coordination normal.     Gait: Gait normal.  Psychiatric:        Mood and Affect: Mood normal.        Behavior: Behavior normal.        Thought Content: Thought content normal.        Judgment:  Judgment normal.     Assessment & Plan:   Problem List Items Addressed This Visit       Cardiovascular and Mediastinum   Hypertensive urgency    BP Readings from Last 3 Encounters:  05/05/23 (!) 148/83  04/30/23 116/86  04/25/23 (!) 143/99  Continue carvedilol 6.25 mg twice daily, hydralazine 25 mg twice daily Verapamil 120 mg daily Maintain close follow-up with nephrologist Continue current medications. No changes in management. Discussed DASH diet and dietary sodium restrictions Continue to increase dietary efforts and exercise.           Nervous and Auditory   Localization-related (focal) (partial) idiopathic epilepsy and epileptic syndromes with seizures of localized onset, not intractable, without status epilepticus (HCC)    No seizure or headache since he left the hospital Continue Keppra to 50 mg twice daily Verapamil 120 mg daily Follow-up as planned with neurologist        Genitourinary   Chronic IgA nephropathy - Primary    He is currently established with nephrologist Plans on getting a kidney transplant Patient encouraged to maintain close follow-up with nephrologist Avoid NSAIDs and other nephrotoxic agents On Farxiga 10 mg daily Takes leukemia 5 mg daily Tarpeyo 4mg  daily Encouraged to drink at least 64 ounces of water daily to maintain hydration        Other   Headache    Currently denies headache Continue verapamil 120 mg daily Fioricet 1 tablet every 6 hours as needed      Hospital discharge  follow-up    Hospital discharge summary, labs and imaging studies reviewed Patient encouraged to follow-up with neurology Maintain close follow-up with nephrologist      Hyperglycemia    Lab Results  Component Value Date   HGBA1C 5.6 05/05/2023         Relevant Orders   POCT glycosylated hemoglobin (Hb A1C) (Completed)    Outpatient Encounter Medications as of 05/05/2023  Medication Sig   butalbital-acetaminophen-caffeine (FIORICET) 50-325-40 MG tablet Take 1 tablet by mouth every 6 (six) hours as needed for headache.   carvedilol (COREG) 6.25 MG tablet Take 1 tablet (6.25 mg total) by mouth 2 (two) times daily with a meal.   dapagliflozin propanediol (FARXIGA) 10 MG TABS tablet Take 10 mg by mouth in the morning.   hydrALAZINE (APRESOLINE) 25 MG tablet Take 1 tablet (25 mg total) by mouth 2 (two) times daily.   levETIRAcetam (KEPPRA) 250 MG tablet Take 1 tablet (250 mg total) by mouth 2 (two) times daily.   LOKELMA 5 g packet Take 1 packet by mouth daily before breakfast.   TARPEYO 4 MG CPDR Take 4 mg by mouth daily with breakfast.   verapamil (CALAN-SR) 120 MG CR tablet Take 1 tablet (120 mg total) by mouth daily.   Calcium Carbonate Antacid (CALCIUM CARBONATE, DOSED IN MG ELEMENTAL CALCIUM,) 1250 MG/5ML SUSP Take 5 mLs (500 mg of elemental calcium total) by mouth every 6 (six) hours as needed for indigestion. (Patient not taking: Reported on 05/05/2023)   Nutritional Supplements (FEEDING SUPPLEMENT, NEPRO CARB STEADY,) LIQD Take 237 mLs by mouth 3 (three) times daily as needed (Supplement). (Patient not taking: Reported on 05/05/2023)   No facility-administered encounter medications on file as of 05/05/2023.    Follow-up: No follow-ups on file.   Donell Beers, FNP

## 2023-07-15 ENCOUNTER — Encounter: Payer: Self-pay | Admitting: Neurology

## 2023-07-15 ENCOUNTER — Ambulatory Visit (INDEPENDENT_AMBULATORY_CARE_PROVIDER_SITE_OTHER): Payer: Medicaid Other | Admitting: Neurology

## 2023-07-15 VITALS — BP 138/86 | Ht 67.0 in | Wt 178.0 lb

## 2023-07-15 DIAGNOSIS — G40909 Epilepsy, unspecified, not intractable, without status epilepticus: Secondary | ICD-10-CM | POA: Diagnosis not present

## 2023-07-15 MED ORDER — LEVETIRACETAM 250 MG PO TABS: 250.0000 mg | ORAL_TABLET | Freq: Two times a day (BID) | ORAL | 3 refills | Status: AC

## 2023-07-15 NOTE — Progress Notes (Signed)
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  Patient tells me that he was on the phone with his wife discussing purchasing a house.  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  He reports waking up to EMS and going to the hospital.  Per chart review it seems that patient was on the phone talking to family then complaint of headaches then dropped his phone and had shaking movement.  In the hospital he was also noted to be in hypertensive urgency.  His blood pressure was controlled, he did have MRI brain showing the bilateral bifrontal encephalomalacia and his EEG showed left frontal temporal frontocentral spike. Also on chart review it seems that patient did have an event in May 2022 that was concerning for seizures.  Due to these episodes, abnormal MRI and EEG he was started on Keppra 250 mg twice daily.  He reports since being on the Keppra no events, and denies any side effect from the medication.  Overall he is doing well.  He does report his blood pressure is better controlled now and his headaches are also better controlled.   Handedness: Right handed   Onset: June 16  Seizure Type: Convulsion  Current frequency: 2, May 2022 and June 2024  Any injuries from seizures: Denies   Seizure risk factors: History  of TBI with bifrontal encephalomalacia   Previous ASMs: None   Currenty ASMs: Keppra 250 mg BID   ASMs side effects: Denies   Brain Images: Bifrontal encephalomalacia   Previous EEGs: Left frontocentral/temporal spikes    OTHER MEDICAL CONDITIONS: IgA Nephropathy, Hypertension, Previous TBI  REVIEW OF SYSTEMS: Full 14 system review of systems performed and negative with exception of: As noted in the HPI   ALLERGIES: No Known Allergies  HOME MEDICATIONS: Outpatient Medications Prior to Visit  Medication Sig Dispense Refill   carvedilol (COREG) 25 MG tablet Take 12.5 mg by mouth 2 (two) times daily with a meal.     dapagliflozin propanediol (FARXIGA) 10 MG TABS tablet Take 10 mg by mouth in the morning.     LOKELMA 5 g packet Take 10 g by mouth daily before breakfast.     TARPEYO 4 MG CPDR Take 16 mg by mouth daily with breakfast.     butalbital-acetaminophen-caffeine (FIORICET) 50-325-40 MG tablet Take 1 tablet by mouth every 6 (six) hours as needed for headache. (Patient not taking: Reported on 07/15/2023) 14 tablet 0   carvedilol (COREG) 6.25 MG tablet Take 1 tablet (6.25 mg total) by mouth 2 (two) times daily with a meal. 60 tablet 1   hydrALAZINE (APRESOLINE) 25 MG tablet Take 1 tablet (25 mg total) by mouth 2 (two) times daily. 60 tablet 1   Nutritional Supplements (FEEDING SUPPLEMENT, NEPRO CARB STEADY,) LIQD Take 237 mLs by mouth 3 (three) times daily as needed (  Supplement). (Patient not taking: Reported on 05/05/2023) 1000 mL 0   verapamil (CALAN-SR) 120 MG CR tablet Take 1 tablet (120 mg total) by mouth daily. 30 tablet 1   levETIRAcetam (KEPPRA) 250 MG tablet Take 1 tablet (250 mg total) by mouth 2 (two) times daily. 60 tablet 1   No facility-administered medications prior to visit.    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   Renal disorder    UTI (urinary tract infection)     PAST SURGICAL HISTORY: Past Surgical  History:  Procedure Laterality Date   CYST REMOVAL NECK      FAMILY HISTORY: Family History  Problem Relation Age of Onset   Hypertension Mother    Hypertension Father     SOCIAL HISTORY: Social History   Socioeconomic History   Marital status: Married    Spouse name: Not on file   Number of children: Not on file   Years of education: Not on file   Highest education level: Not on file  Occupational History   Not on file  Tobacco Use   Smoking status: Former    Current packs/day: 0.00    Average packs/day: 1 pack/day for 3.0 years (3.0 ttl pk-yrs)    Types: Cigarettes    Start date: 08/26/2012    Quit date: 08/27/2015    Years since quitting: 7.8   Smokeless tobacco: Never  Vaping Use   Vaping status: Never Used  Substance and Sexual Activity   Alcohol use: No   Drug use: No   Sexual activity: Yes  Other Topics Concern   Not on file  Social History Narrative   ** Merged History Encounter **    Right handed   Caffeine-none   Social Determinants of Health   Financial Resource Strain: Medium Risk (12/19/2022)   Received from [facility], [facility]   Overall Financial Resource Strain (CARDIA)    Difficulty of Paying Living Expenses: Somewhat hard  Food Insecurity: No Food Insecurity (04/21/2023)   Hunger Vital Sign    Worried About Running Out of Food in the Last Year: Never true    Ran Out of Food in the Last Year: Never true  Transportation Needs: No Transportation Needs (04/21/2023)   PRAPARE - Administrator, Civil Service (Medical): No    Lack of Transportation (Non-Medical): No  Physical Activity: Sufficiently Active (12/19/2022)   Received from [facility], [facility]   Exercise Vital Sign    Days of Exercise per Week: 5 days    Minutes of Exercise per Session: 60 min  Stress: No Stress Concern Present (12/19/2022)   Received from [facility], [facility] - Occupational Stress  Questionnaire    Feeling of Stress : Not at all  Social Connections: Unknown (03/15/2022)   Received from [facility], [facility]   Social Network    Social Network: Not on file  Intimate Partner Violence: Not At Risk (04/21/2023)   Humiliation, Afraid, Rape, and Kick questionnaire    Fear of Current or Ex-Partner: No    Emotionally Abused: No    Physically Abused: No    Sexually Abused: No    PHYSICAL EXAM  GENERAL EXAM/CONSTITUTIONAL: Vitals:  Vitals:   07/15/23 0832  BP: 138/86  Weight: 178 lb (80.7 kg)  Height: 5\' 7"  (1.702 m)   Body mass index is 27.88 kg/m. Wt Readings from Last 3 Encounters:  07/15/23 178  lb (80.7 kg)  05/05/23 178 lb 6.4 oz (80.9 kg)  04/30/23 175 lb 7.8 oz (79.6 kg)   Patient is in no distress; well developed, nourished and groomed; neck is supple  MUSCULOSKELETAL: Gait, strength, tone, movements noted in Neurologic exam below  NEUROLOGIC: MENTAL STATUS:      No data to display         awake, alert, oriented to person, place and time recent and remote memory intact normal attention and concentration language fluent, comprehension intact, naming intact fund of knowledge appropriate  CRANIAL NERVE:  2nd, 3rd, 4th, 6th - Visual fields full to confrontation, extraocular muscles intact, no nystagmus 5th - facial sensation symmetric 7th - facial strength symmetric 8th - hearing intact 9th - palate elevates symmetrically, uvula midline 11th - shoulder shrug symmetric 12th - tongue protrusion midline  MOTOR:  normal bulk and tone, full strength in the BUE, BLE  SENSORY:  normal and symmetric to light touch  COORDINATION:  finger-nose-finger, fine finger movements normal  GAIT/STATION:  normal   DIAGNOSTIC DATA (LABS, IMAGING, TESTING) - I reviewed patient records, labs, notes, testing and imaging myself where available.  Lab Results  Component Value Date   WBC 8.2 04/25/2023   HGB 12.7 (L) 04/25/2023   HCT 37.5 (L)  04/25/2023   MCV 83.3 04/25/2023   PLT 240 04/25/2023      Component Value Date/Time   NA 140 04/30/2023 1139   K 4.0 04/30/2023 1139   CL 104 04/30/2023 1139   CO2 24 04/30/2023 1139   GLUCOSE 123 (H) 04/30/2023 1139   BUN 61 (H) 04/30/2023 1139   CREATININE 3.96 (H) 04/30/2023 1139   CREATININE 0.90 11/27/2016 0922   CALCIUM 8.9 04/30/2023 1139   PROT 6.8 04/30/2023 1139   ALBUMIN 3.7 04/30/2023 1139   AST 19 04/30/2023 1139   ALT 47 (H) 04/30/2023 1139   ALKPHOS 52 04/30/2023 1139   BILITOT 0.3 04/30/2023 1139   GFRNONAA 19 (L) 04/30/2023 1139   GFRNONAA >89 11/27/2016 0922   GFRAA >60 08/26/2017 0715   GFRAA >89 11/27/2016 0922   Lab Results  Component Value Date   CHOL 258 (H) 11/27/2016   HDL 64 11/27/2016   LDLCALC 167 (H) 11/27/2016   TRIG 133 11/27/2016   Lab Results  Component Value Date   HGBA1C 5.6 05/05/2023   No results found for: "VITAMINB12" Lab Results  Component Value Date   TSH 2.177 04/21/2023   MRI Brain 04/22/2023 1. No acute intracranial abnormality. 2. 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   EEG 04/23/2023 -Spike, left fronto-centro-temporal region   I personally reviewed brain Images and previous EEG reports.   ASSESSMENT AND PLAN  37 y.o. year old male  with history of IgA nephropathy, refractive hypertension, TBI with encephalomalacia who is presenting after a seizure in June.  He was started on Keppra 250 mg twice daily and doing well since then.  He denies any side effect from the medication and tolerating very well.  Due to his TBI with encephalomalacia, abnormal EEG, I have informed patient that he should be on the Keppra, given that he is not having any side effect, then this is a good medication for him.  Will see him for follow-up in 1 year but he understands to contact me if he does have an event.  We also discussed driving restriction for total of 6 months.  Continue to follow with your doctors and  return as scheduled.   1. Seizure  disorder [facility])     Patient Instructions  Continue with Keppra 250 mg twice daily, refill given Continue your other medications Continue follow-up with your doctors Follow-up in 1 year or sooner if worse. Please contact us if you do have another event   Per [facility] [place] [facility] statutes, patients with seizures are not allowed to drive until they have been seizure-free for six months.  Other recommendations include using caution when using heavy equipment or power tools. Avoid working on ladders or at heights. Take showers instead of baths.  Do not swim alone.  Ensure the water temperature is not too high on the home water heater. Do not go swimming alone. Do not lock yourself in a room alone (i.e. bathroom). When caring for infants or small children, sit down when holding, feeding, or changing them to minimize risk of injury to the child in the event you have a seizure. Maintain good sleep hygiene. Avoid alcohol.  Also recommend adequate sleep, hydration, good diet and minimize stress.   During the Seizure  - First, ensure adequate ventilation and place patients on the floor on their left side  Loosen clothing around the neck and ensure the airway is patent. If the patient is clenching the teeth, do not force the mouth open with any object as this can cause severe damage - Remove all items from the surrounding that can be hazardous. The patient may be oblivious to what's happening and may not even know what he or she is doing. If the patient is confused and wandering, either gently guide him/her away and block access to outside areas - Reassure the individual and be comforting - Call 911. In most cases, the seizure ends before EMS arrives. However, there are cases when seizures may last over 3 to 5 minutes. Or the individual may have developed breathing difficulties or severe injuries. If a pregnant patient or a person with diabetes develops a seizure, it is  prudent to call an ambulance. - Finally, if the patient does not regain full consciousness, then call EMS. Most patients will remain confused for about 45 to 90 minutes after a seizure, so you must use judgment in calling for help. - Avoid restraints but make sure the patient is in a bed with padded side rails - Place the individual in a lateral position with the neck slightly flexed; this will help the saliva drain from the mouth and prevent the tongue from falling backward - Remove all nearby furniture and other hazards from the area - Provide verbal assurance as the individual is regaining consciousness - Provide the patient with privacy if possible - Call for help and start treatment as ordered by the caregiver   After the Seizure (Postictal Stage)  After a seizure, most patients experience confusion, fatigue, muscle pain and/or a headache. Thus, one should permit the individual to sleep. For the next few days, reassurance is essential. Being calm and helping reorient the person is also of importance.  Most seizures are painless and end spontaneously. Seizures are not harmful to others but can lead to complications such as stress on the lungs, brain and the heart. Individuals with prior lung problems may develop labored breathing and respiratory distress.     No orders of the defined types were placed in this encounter.   Meds ordered this encounter  Medications   levETIRAcetam (KEPPRA) 250 MG tablet    Sig: Take 1 tablet (250 mg total) by mouth 2 (two) times daily.    Dispense:  180 tablet    Refill:  3    Return in about 1 year (around 07/14/2024).    Windell Norfolk, MD 07/15/2023, 9:29 AM  [facility] [address] [place], [place] [address] [phone]

## 2023-07-15 NOTE — Patient Instructions (Addendum)
Continue with Keppra 250 mg twice daily, refill given Continue your other medications Continue follow-up with your doctors Follow-up in 1 year or sooner if worse. Please contact us if you do have another event

## 2023-07-17 ENCOUNTER — Inpatient Hospital Stay (HOSPITAL_COMMUNITY)
Admission: EM | Admit: 2023-07-17 | Discharge: 2023-07-23 | DRG: 305 | Disposition: A | Discharge status: Home / Self Care | Payer: Medicaid Other | Attending: Internal Medicine | Admitting: Internal Medicine

## 2023-07-17 ENCOUNTER — Encounter (HOSPITAL_COMMUNITY): Payer: Self-pay | Admitting: Emergency Medicine

## 2023-07-17 DIAGNOSIS — Z87440 Personal history of urinary (tract) infections: Secondary | ICD-10-CM

## 2023-07-17 DIAGNOSIS — Z87891 Personal history of nicotine dependence: Secondary | ICD-10-CM

## 2023-07-17 DIAGNOSIS — E876 Hypokalemia: Secondary | ICD-10-CM | POA: Diagnosis not present

## 2023-07-17 DIAGNOSIS — R112 Nausea with vomiting, unspecified: Secondary | ICD-10-CM | POA: Diagnosis present

## 2023-07-17 DIAGNOSIS — E860 Dehydration: Secondary | ICD-10-CM | POA: Diagnosis present

## 2023-07-17 DIAGNOSIS — D649 Anemia, unspecified: Secondary | ICD-10-CM | POA: Diagnosis present

## 2023-07-17 DIAGNOSIS — Z8249 Family history of ischemic heart disease and other diseases of the circulatory system: Secondary | ICD-10-CM

## 2023-07-17 DIAGNOSIS — I160 Hypertensive urgency: Principal | ICD-10-CM | POA: Diagnosis present

## 2023-07-17 DIAGNOSIS — Z79899 Other long term (current) drug therapy: Secondary | ICD-10-CM

## 2023-07-17 DIAGNOSIS — N189 Chronic kidney disease, unspecified: Secondary | ICD-10-CM

## 2023-07-17 DIAGNOSIS — G40009 Localization-related (focal) (partial) idiopathic epilepsy and epileptic syndromes with seizures of localized onset, not intractable, without status epilepticus: Secondary | ICD-10-CM | POA: Diagnosis present

## 2023-07-17 DIAGNOSIS — R1110 Vomiting, unspecified: Secondary | ICD-10-CM

## 2023-07-17 DIAGNOSIS — Z87820 Personal history of traumatic brain injury: Secondary | ICD-10-CM

## 2023-07-17 DIAGNOSIS — Z7682 Awaiting organ transplant status: Secondary | ICD-10-CM

## 2023-07-17 DIAGNOSIS — N184 Chronic kidney disease, stage 4 (severe): Secondary | ICD-10-CM | POA: Diagnosis present

## 2023-07-17 DIAGNOSIS — Z91014 Allergy to mammalian meats: Secondary | ICD-10-CM

## 2023-07-17 DIAGNOSIS — Z5986 Financial insecurity: Secondary | ICD-10-CM

## 2023-07-17 DIAGNOSIS — N02B9 Other recurrent and persistent immunoglobulin A nephropathy: Secondary | ICD-10-CM | POA: Diagnosis present

## 2023-07-17 DIAGNOSIS — I10 Essential (primary) hypertension: Principal | ICD-10-CM | POA: Diagnosis present

## 2023-07-17 DIAGNOSIS — N179 Acute kidney failure, unspecified: Secondary | ICD-10-CM | POA: Diagnosis not present

## 2023-07-17 DIAGNOSIS — N02B1 Recurrent and persistent immunoglobulin A nephropathy with glomerular lesion: Secondary | ICD-10-CM | POA: Diagnosis present

## 2023-07-17 NOTE — ED Triage Notes (Addendum)
Pt here with vomiting and lethargy. Hypertensive crisis. Been vomiting today. CBG 96. No fever. Took meds for hypertension. Pt vomited up hypertensive meds- he says he saw them in emesis. Pt states he took at 0730 and vomited around noon about 4 times. He is working on getting a renal transplant.

## 2023-07-18 ENCOUNTER — Emergency Department (HOSPITAL_COMMUNITY): Payer: Medicaid Other

## 2023-07-18 ENCOUNTER — Observation Stay (HOSPITAL_COMMUNITY): Payer: Medicaid Other

## 2023-07-18 ENCOUNTER — Other Ambulatory Visit: Payer: Self-pay

## 2023-07-18 DIAGNOSIS — I160 Hypertensive urgency: Secondary | ICD-10-CM | POA: Diagnosis not present

## 2023-07-18 DIAGNOSIS — N189 Chronic kidney disease, unspecified: Secondary | ICD-10-CM | POA: Diagnosis not present

## 2023-07-18 LAB — CBC WITH DIFFERENTIAL/PLATELET
Abs Immature Granulocytes: 0.05 10*3/uL (ref 0.00–0.07)
Basophils Absolute: 0 10*3/uL (ref 0.0–0.1)
Basophils Relative: 0 %
Eosinophils Absolute: 0.1 10*3/uL (ref 0.0–0.5)
Eosinophils Relative: 1 %
HCT: 37.4 % — ABNORMAL LOW (ref 39.0–52.0)
Hemoglobin: 12.8 g/dL — ABNORMAL LOW (ref 13.0–17.0)
Immature Granulocytes: 1 %
Lymphocytes Relative: 22 %
Lymphs Abs: 2.1 10*3/uL (ref 0.7–4.0)
MCH: 28.7 pg (ref 26.0–34.0)
MCHC: 34.2 g/dL (ref 30.0–36.0)
MCV: 83.9 fL (ref 80.0–100.0)
Monocytes Absolute: 0.7 10*3/uL (ref 0.1–1.0)
Monocytes Relative: 7 %
Neutro Abs: 6.6 10*3/uL (ref 1.7–7.7)
Neutrophils Relative %: 69 %
Platelets: 260 10*3/uL (ref 150–400)
RBC: 4.46 MIL/uL (ref 4.22–5.81)
RDW: 12 % (ref 11.5–15.5)
WBC: 9.6 10*3/uL (ref 4.0–10.5)
nRBC: 0 % (ref 0.0–0.2)

## 2023-07-18 LAB — COMPREHENSIVE METABOLIC PANEL
ALT: 21 U/L (ref 0–44)
AST: 15 U/L (ref 15–41)
Albumin: 2.8 g/dL — ABNORMAL LOW (ref 3.5–5.0)
Alkaline Phosphatase: 33 U/L — ABNORMAL LOW (ref 38–126)
Anion gap: 8 (ref 5–15)
BUN: 45 mg/dL — ABNORMAL HIGH (ref 6–20)
CO2: 25 mmol/L (ref 22–32)
Calcium: 8.6 mg/dL — ABNORMAL LOW (ref 8.9–10.3)
Chloride: 108 mmol/L (ref 98–111)
Creatinine, Ser: 3.7 mg/dL — ABNORMAL HIGH (ref 0.61–1.24)
GFR, Estimated: 21 mL/min — ABNORMAL LOW (ref >60)
Glucose, Bld: 96 mg/dL (ref 70–99)
Potassium: 3.5 mmol/L (ref 3.5–5.1)
Sodium: 141 mmol/L (ref 135–145)
Total Bilirubin: 0.7 mg/dL (ref 0.3–1.2)
Total Protein: 5.6 g/dL — ABNORMAL LOW (ref 6.5–8.1)

## 2023-07-18 LAB — URINALYSIS, W/ REFLEX TO CULTURE (INFECTION SUSPECTED)
Bacteria, UA: NONE SEEN
Bilirubin Urine: NEGATIVE
Glucose, UA: 150 mg/dL — AB
Ketones, ur: NEGATIVE mg/dL
Leukocytes,Ua: NEGATIVE
Nitrite: NEGATIVE
Protein, ur: 100 mg/dL — AB
Specific Gravity, Urine: 1.01 (ref 1.005–1.030)
pH: 6 (ref 5.0–8.0)

## 2023-07-18 MED ORDER — HYDRALAZINE HCL 25 MG PO TABS
25.0000 mg | ORAL_TABLET | Freq: Once | ORAL | Status: AC
  Administered 2023-07-18: 25 mg via ORAL
  Filled 2023-07-18: qty 1

## 2023-07-18 MED ORDER — ONDANSETRON HCL 4 MG/2ML IJ SOLN
4.0000 mg | Freq: Once | INTRAMUSCULAR | Status: AC
  Administered 2023-07-18: 4 mg via INTRAVENOUS
  Filled 2023-07-18: qty 2

## 2023-07-18 MED ORDER — CARVEDILOL 3.125 MG PO TABS: 6.2500 mg | ORAL_TABLET | Freq: Once | ORAL | Status: DC

## 2023-07-18 MED ORDER — HYDRALAZINE HCL 25 MG PO TABS
25.0000 mg | ORAL_TABLET | Freq: Two times a day (BID) | ORAL | Status: DC
  Administered 2023-07-18: 25 mg via ORAL
  Filled 2023-07-18: qty 1

## 2023-07-18 MED ORDER — LABETALOL HCL 5 MG/ML IV SOLN
20.0000 mg | Freq: Once | INTRAVENOUS | Status: AC
  Administered 2023-07-18: 20 mg via INTRAVENOUS
  Filled 2023-07-18: qty 4

## 2023-07-18 MED ORDER — BUDESONIDE 3 MG PO CPEP
15.0000 mg | ORAL_CAPSULE | Freq: Every day | ORAL | Status: DC
  Administered 2023-07-18 – 2023-07-23 (×6): 15 mg via ORAL
  Filled 2023-07-18 (×7): qty 5

## 2023-07-18 MED ORDER — LEVETIRACETAM 250 MG PO TABS
250.0000 mg | ORAL_TABLET | Freq: Two times a day (BID) | ORAL | Status: DC
  Administered 2023-07-18 – 2023-07-23 (×11): 250 mg via ORAL
  Filled 2023-07-18 (×11): qty 1

## 2023-07-18 MED ORDER — HYDRALAZINE HCL 25 MG PO TABS
25.0000 mg | ORAL_TABLET | Freq: Three times a day (TID) | ORAL | Status: DC
  Administered 2023-07-18 – 2023-07-19 (×3): 25 mg via ORAL
  Filled 2023-07-18 (×3): qty 1

## 2023-07-18 MED ORDER — MELATONIN 3 MG PO TABS
3.0000 mg | ORAL_TABLET | Freq: Every day | ORAL | Status: DC
  Administered 2023-07-18 – 2023-07-22 (×5): 3 mg via ORAL
  Filled 2023-07-18 (×5): qty 1

## 2023-07-18 MED ORDER — SODIUM CHLORIDE 0.9 % IV SOLN: INTRAVENOUS | Status: AC

## 2023-07-18 MED ORDER — SODIUM CHLORIDE 0.9 % IV SOLN: INTRAVENOUS | Status: DC

## 2023-07-18 MED ORDER — SODIUM ZIRCONIUM CYCLOSILICATE 10 G PO PACK
10.0000 g | PACK | Freq: Every day | ORAL | Status: DC
  Administered 2023-07-19: 10 g via ORAL
  Filled 2023-07-18: qty 1

## 2023-07-18 MED ORDER — VERAPAMIL HCL ER 120 MG PO TBCR
120.0000 mg | EXTENDED_RELEASE_TABLET | Freq: Every day | ORAL | Status: DC
  Administered 2023-07-18 – 2023-07-23 (×6): 120 mg via ORAL
  Filled 2023-07-18 (×8): qty 1

## 2023-07-18 MED ORDER — LABETALOL HCL 5 MG/ML IV SOLN
10.0000 mg | Freq: Once | INTRAVENOUS | Status: AC
  Administered 2023-07-18: 10 mg via INTRAVENOUS
  Filled 2023-07-18: qty 4

## 2023-07-18 MED ORDER — BUDESONIDE 4 MG PO CPDR: 16.0000 mg | DELAYED_RELEASE_CAPSULE | Freq: Every day | ORAL | Status: DC

## 2023-07-18 MED ORDER — CARVEDILOL 12.5 MG PO TABS
12.5000 mg | ORAL_TABLET | Freq: Two times a day (BID) | ORAL | Status: DC
  Administered 2023-07-18 – 2023-07-19 (×4): 12.5 mg via ORAL
  Filled 2023-07-18 (×4): qty 1

## 2023-07-18 MED ORDER — ENOXAPARIN SODIUM 30 MG/0.3ML IJ SOSY
30.0000 mg | PREFILLED_SYRINGE | INTRAMUSCULAR | Status: DC
  Administered 2023-07-18: 30 mg via SUBCUTANEOUS
  Filled 2023-07-18: qty 0.3

## 2023-07-18 MED ORDER — DAPAGLIFLOZIN PROPANEDIOL 10 MG PO TABS
10.0000 mg | ORAL_TABLET | Freq: Every morning | ORAL | Status: DC
  Administered 2023-07-18 – 2023-07-21 (×4): 10 mg via ORAL
  Filled 2023-07-18 (×5): qty 1

## 2023-07-18 NOTE — Plan of Care (Signed)
  Problem: Education: Goal: Knowledge of General Education information will improve Description Including pain rating scale, medication(s)/side effects and non-pharmacologic comfort measures Outcome: Progressing   

## 2023-07-18 NOTE — ED Provider Notes (Signed)
[place] EMERGENCY DEPARTMENT AT [facility] Provider Note   CSN: [id number] Arrival date & time: 07/17/23  2321     History  Chief Complaint  Patient presents with   Hypertension    Domenique Rahem Dunner is a 37 y.o. male.  The history is provided by the patient, a relative and medical records.  Hypertension  Dijuan Aayan Schroepfer is a 37 y.o. male who presents to the Emergency Department complaining of vomiting.  He presents to the emergency department for evaluation of vomiting that started today.  He has a history of stage IV kidney disease and is on the transplant list.  He states that he believes he has been able to take his medications.  He has associated body aches, headache.  He had 4-5 episodes of yellow emesis today.  No associated diarrhea.  Last BM was 2 days ago.  He does make urine without difficulty and does not have any dysuria.     Home Medications Prior to Admission medications   Medication Sig Start Date End Date Taking? Authorizing Provider  butalbital-acetaminophen-caffeine (FIORICET) 50-325-40 MG tablet Take 1 tablet by mouth every 6 (six) hours as needed for headache. Patient not taking: Reported on 07/15/2023 04/25/23   Maurilio Lovely D, DO  carvedilol (COREG) 25 MG tablet Take 12.5 mg by mouth 2 (two) times daily with a meal. 04/28/23   [provider]  carvedilol (COREG) 6.25 MG tablet Take 1 tablet (6.25 mg total) by mouth 2 (two) times daily with a meal. 04/25/23 06/24/23  Sherryll Burger, Pratik D, DO  dapagliflozin propanediol (FARXIGA) 10 MG TABS tablet Take 10 mg by mouth in the morning.    [provider]  hydrALAZINE (APRESOLINE) 25 MG tablet Take 1 tablet (25 mg total) by mouth 2 (two) times daily. 04/25/23 06/24/23  Sherryll Burger, Pratik D, DO  levETIRAcetam (KEPPRA) 250 MG tablet Take 1 tablet (250 mg total) by mouth 2 (two) times daily. 07/15/23 07/09/24  Windell Norfolk, MD  LOKELMA 5 g packet Take 10 g by mouth daily before  breakfast. 10/15/22   [provider]  Nutritional Supplements (FEEDING SUPPLEMENT, NEPRO CARB STEADY,) LIQD Take 237 mLs by mouth 3 (three) times daily as needed (Supplement). Patient not taking: Reported on 05/05/2023 04/25/23   Maurilio Lovely D, DO  TARPEYO 4 MG CPDR Take 16 mg by mouth daily with breakfast. 04/08/23   [provider]  verapamil (CALAN-SR) 120 MG CR tablet Take 1 tablet (120 mg total) by mouth daily. 04/26/23 06/25/23  Maurilio Lovely D, DO      Allergies    Patient has no known allergies.    Review of Systems   Review of Systems  All other systems reviewed and are negative.   Physical Exam Updated Vital Signs BP (!) 179/110   Pulse 74   Temp 98.2 F (36.8 C)   Resp 16   SpO2 98%  Physical Exam Vitals and nursing note reviewed.  Constitutional:      Appearance: He is well-developed.  HENT:     Head: Normocephalic and atraumatic.  Cardiovascular:     Rate and Rhythm: Normal rate and regular rhythm.     Heart sounds: No murmur heard. Pulmonary:     Effort: Pulmonary effort is normal. No respiratory distress.     Breath sounds: Normal breath sounds.  Abdominal:     Palpations: Abdomen is soft.     Tenderness: There is no abdominal tenderness. There is no guarding or rebound.  Musculoskeletal:        General: No swelling or tenderness.  Skin:    General: Skin is warm and dry.  Neurological:     Mental Status: He is alert and oriented to person, place, and time.  Psychiatric:        Behavior: Behavior normal.     ED Results / Procedures / Treatments   Labs (all labs ordered are listed, but only abnormal results are displayed) Labs Reviewed  CBC WITH DIFFERENTIAL/PLATELET - Abnormal; Notable for the following components:      Result Value   Hemoglobin 12.8 (*)    HCT 37.4 (*)    All other components within normal limits  COMPREHENSIVE METABOLIC PANEL - Abnormal; Notable for the following components:   BUN 45 (*)    Creatinine, Ser 3.70  (*)    Calcium 8.6 (*)    Total Protein 5.6 (*)    Albumin 2.8 (*)    Alkaline Phosphatase 33 (*)    GFR, Estimated 21 (*)    All other components within normal limits  URINALYSIS, W/ REFLEX TO CULTURE (INFECTION SUSPECTED) - Abnormal; Notable for the following components:   Color, Urine STRAW (*)    Glucose, UA 150 (*)    Hgb urine dipstick MODERATE (*)    Protein, ur 100 (*)    All other components within normal limits    EKG EKG Interpretation Date/Time:  Friday July 18 2023 00:26:48 EDT Ventricular Rate:  73 PR Interval:  128 QRS Duration:  101 QT Interval:  420 QTC Calculation: 463 R Axis:   26  Text Interpretation: Sinus rhythm Confirmed by Tilden Fossa [phone]) on 07/18/2023 7:10:21 AM  Radiology DG Chest 2 View  Result Date: 07/18/2023 CLINICAL DATA:  Hypertension. EXAM: CHEST - 2 VIEW COMPARISON:  September 02, 2017 FINDINGS: The heart size and mediastinal contours are within normal limits. Both lungs are clear. The visualized skeletal structures are unremarkable. IMPRESSION: No active cardiopulmonary disease. Electronically Signed   By: Aram Candela M.D.   On: 07/18/2023 01:33    Procedures Procedures    Medications Ordered in ED Medications  0.9 %  sodium chloride infusion ( Intravenous New Bag/Given 07/18/23 0141)  ondansetron (ZOFRAN) injection 4 mg (4 mg Intravenous Given 07/18/23 0134)  labetalol (NORMODYNE) injection 10 mg (10 mg Intravenous Given 07/18/23 0133)  hydrALAZINE (APRESOLINE) tablet 25 mg (25 mg Oral Given 07/18/23 0449)  labetalol (NORMODYNE) injection 20 mg (20 mg Intravenous Given 07/18/23 0554)    ED Course/ Medical Decision Making/ A&P                                 Medical Decision Making Amount and/or Complexity of Data Reviewed Labs: ordered. Radiology: ordered.  Risk Prescription drug management. Decision regarding hospitalization.   Patient with history of IgA nephropathy with CKD, hypertension, focal seizures here  for evaluation of vomiting for 24 hours with significantly elevated blood pressures.  He has no focal deficits on examination.  Labs with stable renal function.  UA with decreased protein from his baseline.  His blood pressures are significantly elevated.  He was treated with IV fluids, antiemetic and his vomiting has resolved.  Blood pressures have partially improved with antihypertensives but he does continue to remain hypertensive.  When compared to baseline this is a significant change.  Concern for hypertensive urgency.  Plan to admit for ongoing observation.  Final Clinical Impression(s) / ED Diagnoses Final diagnoses:  Accelerated hypertension  Chronic kidney disease, unspecified CKD stage  Vomiting, unspecified vomiting type, unspecified whether nausea present    Rx / DC Orders ED Discharge Orders     None         Tilden Fossa, MD 07/18/23 [phone]

## 2023-07-18 NOTE — H&P (Addendum)
Date: 07/18/2023               Patient Name:  Gary Cox MRN: [id number]  DOB: 09-20-1986 Age / Sex: 37 y.o., male   PCP: Massie Maroon, FNP         Medical Service: Internal Medicine Teaching Service         Attending Physician: Dr. Mercie Eon, MD      First Contact: Dr. Lovie Macadamia, MD [phone]    Second Contact: Dr. 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  Yesterday afternoon he began to feel very tired and checked his blood pressure at home with a systolic blood pressure of 199.  He also became nauseous and vomited about 4-5 times before coming the hospital with yellow color and no blood.  The symptoms are similar but less severe than hospitalization in 04/2023 for severe hypertension.  At that admission he also had a headache and some tonic-clonic movements in his hands which he has not had yesterday or today.  He denies any other associated symptoms including chest pain/discomfort, dyspnea, vision changes, trouble swallowing, cough, abdominal pain, lower extremity swelling, focal weakness, or any changed/missed medications.  Prior to these symptoms he was in his normal state of health and does not know of any precipitating event.  ED Course: Presented with symptoms as above with blood pressure 204/116 but otherwise hemodynamically stable and breathing well on room air.  Labs showed stable creatinine of 3.7, BUN 45, calcium of 8.6, albumin of 2.8, otherwise no significant metabolic abnormalities.  Hemoglobin 12.8 without leukocytosis or  thrombocytopenia.  Urinalysis showed glucosuria (on SGLT2i), hematuria, and proteinuria both chronic and consistent with IgA nephropathy.  No signs or symptoms of UTI.  Chest x-ray showed no significant changes to indicate vascular congestion or pleural effusion.  EKG stable from prior without signs of ischemia.  Past Medical History IgA nephropathy CKD stage IV Hypertension Focal seizures  Meds:  Carvedilol 12.5 mg twice daily Dapagliflozin 10 mg daily Hydralazine 25 mg twice daily Levetiracetam 250 mg twice daily Lokelma 10 g daily Budesonide 60 mg daily Verapamil 120 mg daily  Past Surgical History Past Surgical History:  Procedure Laterality Date   CYST REMOVAL NECK      Social:  Lives in [place] and is independent with ADLs and IADLs.  Works in Airline pilot at [employer]. PCP: Massie Maroon, FNP Substances: Denies current or prior use of tobacco, alcohol, or drugs.  Family History:  Father and mother with hypertension.  Brother had cancer in his spine.  No known family history of renal disease, diabetes, strokes, or cardiac disease.  Allergies: Allergies as of 07/17/2023   (No Known Allergies)    Review of Systems: A complete ROS was negative except as per HPI.   OBJECTIVE:   Physical Exam: Blood pressure (!) 170/109, pulse 80, temperature 98 F (36.7 C), temperature source Oral, resp. rate 17, SpO2 99%.  Constitutional: Tired appearing adult male laying in bed. In no acute distress. HENT: Normocephalic, atraumatic,  Eyes: Sclera non-icteric,  PERRL, EOM intact Neck:normal atraumatic, no jvd Cardio:Regular rate and rhythm. No murmurs, rubs, or gallops. 2+ bilateral radial and dorsalis pedis  pulses. Pulm: Mildly decreased lung sounds in the bases bilaterally without wheezes or crackles. Normal work of breathing on room air. Abdomen: Soft, non-tender, non-distended, positive bowel sounds. WUJ:[id number] for extremity edema. Skin:Warm and dry. Neuro:Alert  and oriented x3. No focal deficit noted.  Intact motor and sensory function in all 4 extremities and cranial nerves II through XII intact.  Labs: CBC    Component Value Date/Time   WBC 9.6 07/18/2023 0059   RBC 4.46 07/18/2023 0059   HGB 12.8 (L) 07/18/2023 0059   HCT 37.4 (L) 07/18/2023 0059   PLT 260 07/18/2023 0059   MCV 83.9 07/18/2023 0059   MCH 28.7 07/18/2023 0059   MCHC 34.2 07/18/2023 0059   RDW 12.0 07/18/2023 0059   LYMPHSABS 2.1 07/18/2023 0059   MONOABS 0.7 07/18/2023 0059   EOSABS 0.1 07/18/2023 0059   BASOSABS 0.0 07/18/2023 0059     CMP     Component Value Date/Time   NA 141 07/18/2023 0309   K 3.5 07/18/2023 0309   CL 108 07/18/2023 0309   CO2 25 07/18/2023 0309   GLUCOSE 96 07/18/2023 0309   BUN 45 (H) 07/18/2023 0309   CREATININE 3.70 (H) 07/18/2023 0309   CREATININE 0.90 11/27/2016 0922   CALCIUM 8.6 (L) 07/18/2023 0309   PROT 5.6 (L) 07/18/2023 0309   ALBUMIN 2.8 (L) 07/18/2023 0309   AST 15 07/18/2023 0309   ALT 21 07/18/2023 0309   ALKPHOS 33 (L) 07/18/2023 0309   BILITOT 0.7 07/18/2023 0309   GFRNONAA 21 (L) 07/18/2023 0309   GFRNONAA >89 11/27/2016 0922   GFRAA >60 08/26/2017 0715   GFRAA >89 11/27/2016 0922    Imaging: DG Chest 2 View  Result Date: 07/18/2023 CLINICAL DATA:  Hypertension. EXAM: CHEST - 2 VIEW COMPARISON:  September 02, 2017 FINDINGS: The heart size and mediastinal contours are within normal limits. Both lungs are clear. The visualized skeletal structures are unremarkable. IMPRESSION: No active cardiopulmonary disease. Electronically Signed   By: Aram Candela M.D.   On: 07/18/2023 01:33     EKG: personally reviewed my interpretation is normal sinus rhythm. Consistent with prior EKG.  ASSESSMENT & PLAN:   Assessment & Plan by Problem: Principal Problem:   Hypertensive urgency Active Problems:   Chronic IgA nephropathy   Localization-related (focal) (partial) idiopathic epilepsy and epileptic syndromes with seizures  of localized onset, not intractable, without status epilepticus (HCC)   Gary Cox is a 37 y.o. 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  Hypertensive urgency Blood pressure is responded well to IV labetalol and oral hydralazine.  No signs or symptoms of ACS, acute heart failure, or pulmonary edema at this time but he does have some decreased lung sounds at his bases so I will repeat a chest x-ray.  Most recent echocardiogram from 04/2023 shows LVEF of 60 to 65% without any significant systolic or diastolic dysfunction. At the moment I do not think he needs continuous IV blood pressure medication.  He responded well to home dose of hydralazine and IV labetalol.  Blood pressure during my exam down to 170/100.  We will continue to decrease his blood pressure over the next 6 hours with a goal of 160 systolic by  his afternoon and then normal by tomorrow. 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 Marisue Humble at [facility].  Creatinine at baseline of 3.7 and he has a previously documented history of recurrent UTIs.  UA here consistent with previous recent UAs (glucosuria, hematuria, and proteinuria) although improved proteinuria.  He is on budesonide 16 mg daily for immunosuppression and an SGLT2 inhibitor.  He has previously been on mycophenolate mofetil, lisinopril.  He has also been evaluated by nephrology at [facility] (Dr. Elson Clan) who did not think immune suppression would be of any significant  benefit due to the degree of scarring he has.  He has been off RAAS inhibition due to hyperkalemia and has been on [facility] for this. Please see nephrology consult note (Dr. Malen Gauze) from 04/21/2023 for overview of renal disease history. - Continue budesonide equivalent here (tarpeyo not on formulary) - Continue Farxiga 10 mg daily and Lokelma 10 g daily - Hold off on nephrology consultation with stable renal function  Nausea and Vomiting 4-5 episodes of nonbloody nonbilious emesis prior to arrival to the ED.  Consistent with his previous episodes of hypertensive urgency.  No other worrisome GI or neurosymptoms.  Responded well to IV Zofran 4 mg in the ED.  Not having any nausea and has not vomited since arriving to the hospital.  Will continue to monitor and can add as needed Zofran if having more nausea.  QTc 463 ms.  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  EEG at that time showed epileptogenicity arising from  left fronto-centro-temporal region without active seizures.  He was started on Keppra 250 mg twice daily.  There are no signs or symptoms of a seizure prior to or during this admission.  He also denies any seizures since his last admission. - Continue to monitor for headache or other signs of seizure - Continue Keppra 250 mg twice daily  Mild normocytic anemia Hemoglobin 12.8 here and consistent with most recent value of 12.7 from 04/25/2023.  Patient seems to however in the 13's.  No signs or symptoms of bleeding.  Most recent iron studies from 06/12/2023 with iron of 96, ferritin of 21, TIBC of 355, and saturation of 27.  Will continue to monitor here.  Diet: Renal VTE: Enoxaparin Code: Full  Dispo: Admit patient to Observation with expected length of  stay less than 2 midnights.  Signed: Rocky Morel, DO Internal Medicine Resident PGY-2  07/18/2023, 9:50 AM   Dr. Lovie Macadamia, MD [phone]

## 2023-07-18 NOTE — Hospital Course (Addendum)
Last time headache nv htn iga neph ?seizure?  Have not given oral BB No headache    Yest morning ok Pm tired, BP high, 199 sbp, nausea, vomit 4-5 x before hospital,  Bm today light  Nephro- in   No allergies Works at [employer] No tad  Dad= htn,  Little brother- back cancer  9/15 He's doing alright, no pain, urinating ok without changes in color/amount. No urine character changes at all during this episode. Talked about creatinine. Pharmacy - [facility] [address] PCP appointment upcoming in September. Increase hydral? Increase coreg?

## 2023-07-18 NOTE — Plan of Care (Signed)
  Problem: Education: Goal: Knowledge of General Education information will improve Description: Including pain rating scale, medication(s)/side effects and non-pharmacologic comfort measures 07/18/2023 1142 by Dorian Pod, RN Outcome: Progressing 07/18/2023 1142 by Dorian Pod, RN Outcome: Progressing   Problem: Education: Goal: Knowledge of disease and its progression will improve Outcome: Progressing Goal: Individualized Educational Video(s) Outcome: Progressing   Problem: Fluid Volume: Goal: Compliance with measures to maintain balanced fluid volume will improve Outcome: Progressing   Problem: Nutritional: Goal: Ability to make healthy dietary choices will improve Outcome: Progressing   Problem: Clinical Measurements: Goal: Complications related to the disease process, condition or treatment will be avoided or minimized Outcome: Progressing

## 2023-07-18 NOTE — ED Notes (Signed)
ED TO INPATIENT HANDOFF REPORT  ED Nurse Name and Phone #: Morrie Sheldon [phone]  S Name/Age/Gender Gary Cox 37 y.o. male Room/Bed: 023C/023C  Code Status   Code Status: Full Code  Home/SNF/Other Home Patient oriented to: self, place, time, and situation Is this baseline? Yes   Triage Complete: Triage complete  Chief Complaint Hypertensive urgency [I16.0]  Triage Note Pt here with vomiting and lethargy. Hypertensive crisis. Been vomiting today. CBG 96. No fever. Took meds for hypertension. Pt vomited up hypertensive meds- he says he saw them in emesis. Pt states he took at 0730 and vomited around noon about 4 times. He is working on getting a renal transplant.    Allergies No Known Allergies  Level of Care/Admitting Diagnosis ED Disposition     ED Disposition  Admit   Condition  --   Comment  Hospital Area: [facility] [[id number]]  Level of Care: Telemetry Medical [104]  May place patient in observation at [facility] or [facility] if equivalent level of care is available:: Yes  Covid Evaluation: Asymptomatic - no recent exposure (last 10 days) testing not required  Diagnosis: Hypertensive urgency [650126]  Admitting Physician: Mercie Eon [[id number]]  Attending Physician: Mercie Eon [[id number]]          B Medical/Surgery History Past Medical History:  Diagnosis Date   IgA nephropathy 08/26/2017   Renal disorder    UTI (urinary tract infection)    Past Surgical History:  Procedure Laterality Date   CYST REMOVAL NECK       A IV Location/Drains/Wounds Patient Lines/Drains/Airways Status     Active Line/Drains/Airways     Name Placement date Placement time Site Days   Peripheral IV 07/18/23 20 G Anterior;Right Forearm 07/18/23  0133  Forearm  less than 1   Wound / Incision (Open or Dehisced) 08/25/17 Puncture Throat Mid;Anterior 3 CM puncture wound 08/25/17  1621  Throat  2153   Wound / Incision (Open or Dehisced) 08/21/21  Puncture Flank Lateral;Left renal biopsy site 08/21/21  0854  Flank  696   Wound / Incision (Open or Dehisced) 11/07/22 Puncture Flank Left 11/07/22  0847  Flank  253            Intake/Output Last 24 hours No intake or output data in the 24 hours ending 07/18/23 0902  Labs/Imaging Results for orders placed or performed during the hospital encounter of 07/17/23 (from the past 48 hour(s))  CBC with Differential     Status: Abnormal   Collection Time: 07/18/23 12:59 AM  Result Value Ref Range   WBC 9.6 4.0 - 10.5 K/uL   RBC 4.46 4.22 - 5.81 MIL/uL   Hemoglobin 12.8 (L) 13.0 - 17.0 g/dL   HCT 32.9 (L) 51.8 - 84.1 %   MCV 83.9 80.0 - 100.0 fL   MCH 28.7 26.0 - 34.0 pg   MCHC 34.2 30.0 - 36.0 g/dL   RDW 66.0 63.0 - 16.0 %   Platelets 260 150 - 400 K/uL   nRBC 0.0 0.0 - 0.2 %   Neutrophils Relative % 69 %   Neutro Abs 6.6 1.7 - 7.7 K/uL   Lymphocytes Relative 22 %   Lymphs Abs 2.1 0.7 - 4.0 K/uL   Monocytes Relative 7 %   Monocytes Absolute 0.7 0.1 - 1.0 K/uL   Eosinophils Relative 1 %   Eosinophils Absolute 0.1 0.0 - 0.5 K/uL   Basophils Relative 0 %   Basophils Absolute 0.0 0.0 - 0.1 K/uL  Immature Granulocytes 1 %   Abs Immature Granulocytes 0.05 0.00 - 0.07 K/uL    Comment: Performed at [facility], [address], [place], [place] [address]  Comprehensive metabolic panel     Status: Abnormal   Collection Time: 07/18/23  3:09 AM  Result Value Ref Range   Sodium 141 135 - 145 mmol/L   Potassium 3.5 3.5 - 5.1 mmol/L   Chloride 108 98 - 111 mmol/L   CO2 25 22 - 32 mmol/L   Glucose, Bld 96 70 - 99 mg/dL    Comment: Glucose reference range applies only to samples taken after fasting for at least 8 hours.   BUN 45 (H) 6 - 20 mg/dL   Creatinine, Ser 5.18 (H) 0.61 - 1.24 mg/dL   Calcium 8.6 (L) 8.9 - 10.3 mg/dL   Total Protein 5.6 (L) 6.5 - 8.1 g/dL   Albumin 2.8 (L) 3.5 - 5.0 g/dL   AST 15 15 - 41 U/L   ALT 21 0 - 44 U/L   Alkaline Phosphatase 33 (L) 38 - 126 U/L    Total Bilirubin 0.7 0.3 - 1.2 mg/dL   GFR, Estimated 21 (L) >60 mL/min    Comment: (NOTE) Calculated using the CKD-EPI Creatinine Equation (2021)    Anion gap 8 5 - 15    Comment: Performed at [facility], [address], [place], [place] [address]  Urinalysis, w/ Reflex to Culture (Infection Suspected) -Urine, Clean Catch     Status: Abnormal   Collection Time: 07/18/23  5:36 AM  Result Value Ref Range   Specimen Source URINE, CLEAN CATCH    Color, Urine STRAW (A) YELLOW   APPearance CLEAR CLEAR   Specific Gravity, Urine 1.010 1.005 - 1.030   pH 6.0 5.0 - 8.0   Glucose, UA 150 (A) NEGATIVE mg/dL   Hgb urine dipstick MODERATE (A) NEGATIVE   Bilirubin Urine NEGATIVE NEGATIVE   Ketones, ur NEGATIVE NEGATIVE mg/dL   Protein, ur 063 (A) NEGATIVE mg/dL   Nitrite NEGATIVE NEGATIVE   Leukocytes,Ua NEGATIVE NEGATIVE   RBC / HPF 21-50 0 - 5 RBC/hpf   WBC, UA 0-5 0 - 5 WBC/hpf    Comment:        Reflex urine culture not performed if WBC <=10, OR if Squamous epithelial cells >5. If Squamous epithelial cells >5 suggest recollection.    Bacteria, UA NONE SEEN NONE SEEN   Squamous Epithelial / HPF 0-5 0 - 5 /HPF    Comment: Performed at [facility], [address], [place], [place] [address]   DG Chest 2 View  Result Date: 07/18/2023 CLINICAL DATA:  Hypertension. EXAM: CHEST - 2 VIEW COMPARISON:  September 02, 2017 FINDINGS: The heart size and mediastinal contours are within normal limits. Both lungs are clear. The visualized skeletal structures are unremarkable. IMPRESSION: No active cardiopulmonary disease. Electronically Signed   By: Aram Candela M.D.   On: 07/18/2023 01:33    Pending Labs Unresulted Labs (From admission, onward)    None       Vitals/Pain Today's Vitals   07/18/23 0615 07/18/23 0645 07/18/23 0815 07/18/23 0852  BP: (!) 187/102 (!) 179/110 (!) 170/109   Pulse: 65 74 80   Resp:   17   Temp:    98 F (36.7 C)  TempSrc:    Oral  SpO2: 98%  98% 99%     Isolation Precautions No active isolations  Medications Medications  0.9 %  sodium  chloride infusion ( Intravenous New Bag/Given 07/18/23 0141)  enoxaparin (LOVENOX) injection 30 mg (has no administration in time range)  ondansetron (ZOFRAN) injection 4 mg (4 mg Intravenous Given 07/18/23 0134)  labetalol (NORMODYNE) injection 10 mg (10 mg Intravenous Given 07/18/23 0133)  hydrALAZINE (APRESOLINE) tablet 25 mg (25 mg Oral Given 07/18/23 0449)  labetalol (NORMODYNE) injection 20 mg (20 mg Intravenous Given 07/18/23 0554)    Mobility walks     Focused Assessments See chart   R Recommendations: See Admitting Provider Note  Report given to:   Additional Notes: see chart

## 2023-07-19 DIAGNOSIS — Z7682 Awaiting organ transplant status: Secondary | ICD-10-CM | POA: Diagnosis not present

## 2023-07-19 DIAGNOSIS — R112 Nausea with vomiting, unspecified: Secondary | ICD-10-CM | POA: Diagnosis present

## 2023-07-19 DIAGNOSIS — Z8249 Family history of ischemic heart disease and other diseases of the circulatory system: Secondary | ICD-10-CM | POA: Diagnosis not present

## 2023-07-19 DIAGNOSIS — I10 Essential (primary) hypertension: Secondary | ICD-10-CM | POA: Diagnosis present

## 2023-07-19 DIAGNOSIS — Z87891 Personal history of nicotine dependence: Secondary | ICD-10-CM | POA: Diagnosis not present

## 2023-07-19 DIAGNOSIS — D649 Anemia, unspecified: Secondary | ICD-10-CM | POA: Diagnosis present

## 2023-07-19 DIAGNOSIS — N189 Chronic kidney disease, unspecified: Secondary | ICD-10-CM | POA: Diagnosis not present

## 2023-07-19 DIAGNOSIS — I160 Hypertensive urgency: Secondary | ICD-10-CM | POA: Diagnosis present

## 2023-07-19 DIAGNOSIS — R1110 Vomiting, unspecified: Secondary | ICD-10-CM | POA: Diagnosis not present

## 2023-07-19 DIAGNOSIS — N184 Chronic kidney disease, stage 4 (severe): Secondary | ICD-10-CM | POA: Diagnosis present

## 2023-07-19 DIAGNOSIS — Z87820 Personal history of traumatic brain injury: Secondary | ICD-10-CM | POA: Diagnosis not present

## 2023-07-19 DIAGNOSIS — G40009 Localization-related (focal) (partial) idiopathic epilepsy and epileptic syndromes with seizures of localized onset, not intractable, without status epilepticus: Secondary | ICD-10-CM | POA: Diagnosis present

## 2023-07-19 DIAGNOSIS — Z87440 Personal history of urinary (tract) infections: Secondary | ICD-10-CM | POA: Diagnosis not present

## 2023-07-19 DIAGNOSIS — Z91014 Allergy to mammalian meats: Secondary | ICD-10-CM | POA: Diagnosis not present

## 2023-07-19 DIAGNOSIS — Z79899 Other long term (current) drug therapy: Secondary | ICD-10-CM | POA: Diagnosis not present

## 2023-07-19 DIAGNOSIS — N179 Acute kidney failure, unspecified: Secondary | ICD-10-CM | POA: Diagnosis not present

## 2023-07-19 DIAGNOSIS — Z5986 Financial insecurity: Secondary | ICD-10-CM | POA: Diagnosis not present

## 2023-07-19 DIAGNOSIS — E860 Dehydration: Secondary | ICD-10-CM | POA: Diagnosis present

## 2023-07-19 DIAGNOSIS — E876 Hypokalemia: Secondary | ICD-10-CM | POA: Diagnosis not present

## 2023-07-19 DIAGNOSIS — N02B1 Recurrent and persistent immunoglobulin A nephropathy with glomerular lesion: Secondary | ICD-10-CM | POA: Diagnosis present

## 2023-07-19 LAB — BASIC METABOLIC PANEL
Anion gap: 13 (ref 5–15)
BUN: 49 mg/dL — ABNORMAL HIGH (ref 6–20)
CO2: 24 mmol/L (ref 22–32)
Calcium: 8.8 mg/dL — ABNORMAL LOW (ref 8.9–10.3)
Chloride: 103 mmol/L (ref 98–111)
Creatinine, Ser: 4.04 mg/dL — ABNORMAL HIGH (ref 0.61–1.24)
GFR, Estimated: 19 mL/min — ABNORMAL LOW (ref >60)
Glucose, Bld: 108 mg/dL — ABNORMAL HIGH (ref 70–99)
Potassium: 3.1 mmol/L — ABNORMAL LOW (ref 3.5–5.1)
Sodium: 140 mmol/L (ref 135–145)

## 2023-07-19 LAB — CBC
HCT: 35.7 % — ABNORMAL LOW (ref 39.0–52.0)
Hemoglobin: 12.4 g/dL — ABNORMAL LOW (ref 13.0–17.0)
MCH: 29.4 pg (ref 26.0–34.0)
MCHC: 34.7 g/dL (ref 30.0–36.0)
MCV: 84.6 fL (ref 80.0–100.0)
Platelets: 202 10*3/uL (ref 150–400)
RBC: 4.22 MIL/uL (ref 4.22–5.81)
RDW: 12.1 % (ref 11.5–15.5)
WBC: 7.8 10*3/uL (ref 4.0–10.5)
nRBC: 0 % (ref 0.0–0.2)

## 2023-07-19 LAB — RENAL FUNCTION PANEL
Albumin: 2.7 g/dL — ABNORMAL LOW (ref 3.5–5.0)
Anion gap: 8 (ref 5–15)
BUN: 51 mg/dL — ABNORMAL HIGH (ref 6–20)
CO2: 24 mmol/L (ref 22–32)
Calcium: 8.7 mg/dL — ABNORMAL LOW (ref 8.9–10.3)
Chloride: 108 mmol/L (ref 98–111)
Creatinine, Ser: 4.28 mg/dL — ABNORMAL HIGH (ref 0.61–1.24)
GFR, Estimated: 18 mL/min — ABNORMAL LOW (ref >60)
Glucose, Bld: 112 mg/dL — ABNORMAL HIGH (ref 70–99)
Phosphorus: 4.8 mg/dL — ABNORMAL HIGH (ref 2.5–4.6)
Potassium: 3.6 mmol/L (ref 3.5–5.1)
Sodium: 140 mmol/L (ref 135–145)

## 2023-07-19 MED ORDER — RIVAROXABAN 10 MG PO TABS
10.0000 mg | ORAL_TABLET | Freq: Every day | ORAL | Status: DC
  Administered 2023-07-19 – 2023-07-23 (×5): 10 mg via ORAL
  Filled 2023-07-19 (×5): qty 1

## 2023-07-19 MED ORDER — POTASSIUM CHLORIDE CRYS ER 20 MEQ PO TBCR: 40.0000 meq | EXTENDED_RELEASE_TABLET | Freq: Two times a day (BID) | ORAL | Status: DC

## 2023-07-19 MED ORDER — HYDRALAZINE HCL 25 MG PO TABS
25.0000 mg | ORAL_TABLET | Freq: Once | ORAL | Status: AC
  Administered 2023-07-19: 25 mg via ORAL
  Filled 2023-07-19: qty 1

## 2023-07-19 MED ORDER — POTASSIUM CHLORIDE CRYS ER 20 MEQ PO TBCR
40.0000 meq | EXTENDED_RELEASE_TABLET | Freq: Once | ORAL | Status: DC
  Administered 2023-07-19: 40 meq via ORAL
  Filled 2023-07-19: qty 2

## 2023-07-19 MED ORDER — HYDRALAZINE HCL 50 MG PO TABS
75.0000 mg | ORAL_TABLET | Freq: Three times a day (TID) | ORAL | Status: DC
  Administered 2023-07-19 – 2023-07-20 (×2): 75 mg via ORAL
  Filled 2023-07-19 (×3): qty 1

## 2023-07-19 MED ORDER — HYDRALAZINE HCL 50 MG PO TABS
50.0000 mg | ORAL_TABLET | Freq: Three times a day (TID) | ORAL | Status: DC
  Administered 2023-07-19: 50 mg via ORAL
  Filled 2023-07-19: qty 1

## 2023-07-19 NOTE — Plan of Care (Signed)
  Problem: Education: Goal: Knowledge of General Education information will improve Description: Including pain rating scale, medication(s)/side effects and non-pharmacologic comfort measures Outcome: Progressing   Problem: Education: Goal: Knowledge of disease and its progression will improve Outcome: Progressing Goal: Individualized Educational Video(s) Outcome: Progressing   Problem: Fluid Volume: Goal: Compliance with measures to maintain balanced fluid volume will improve Outcome: Progressing   Problem: Health Behavior/Discharge Planning: Goal: Ability to manage health-related needs will improve Outcome: Progressing   Problem: Health Behavior/Discharge Planning: Goal: Ability to manage health-related needs will improve Outcome: Progressing

## 2023-07-19 NOTE — Progress Notes (Signed)
HD#0 Subjective:   Summary: Gary Cox is a 37 y.o. male with pertinent PMH of IgA nephropathy, CKD 4, TBI, seizure severe who presented with fatigue, nausea, and vomiting and is admitted for severe asymptomatic hypertension.   Overnight Events: Patient's blood pressures improved overnight though remain elevated.  Patient was concerned with his blood pressures remaining elevated overnight.  Otherwise he denies nausea vomiting or fatigue.  He feels relatively back to baseline.  He his on his home antihypertensive regiment.  He is making good urine, and says he is not thirsty and his urine is light in color.  Objective:  Vital signs in last 24 hours: Vitals:   07/18/23 1600 07/18/23 2014 07/19/23 0427 07/19/23 0833  BP: (!) 166/111 (!) 179/121 (!) 167/117 (!) 180/111  Pulse: 94 87 85 84  Resp: 18 18 18 18   Temp: 98.6 F (37 C) 98.5 F (36.9 C) 98.2 F (36.8 C)   TempSrc: Oral Oral Oral   SpO2: 97% 97% 96% 99%  Weight:      Height:       Supplemental O2: Room Air SpO2: 99 %   Physical Exam:  Constitutional: in no acute distress Cardiovascular: regular rate and rhythm, no m/r/g Pulmonary/Chest: normal work of breathing on room air, lungs clear to auscultation bilaterally Abdominal: soft, non-tender, non-distended, positive bowel sounds Skin: warm and dry   Filed Weights   07/18/23 1403  Weight: 78.3 kg      Intake/Output Summary (Last 24 hours) at 07/19/2023 0930 Last data filed at 07/19/2023 0900 Gross per 24 hour  Intake 610 ml  Output --  Net 610 ml   Net IO Since Admission: 610 mL [07/19/23 0930]  Pertinent Labs:    Latest Ref Rng & Units 07/19/2023    4:56 AM 07/18/2023   12:59 AM 04/25/2023    1:12 AM  CBC  WBC 4.0 - 10.5 K/uL 7.8  9.6  8.2   Hemoglobin 13.0 - 17.0 g/dL 60.4  54.0  98.1   Hematocrit 39.0 - 52.0 % 35.7  37.4  37.5   Platelets 150 - 400 K/uL 202  260  240        Latest Ref Rng & Units 07/19/2023    4:56 AM  07/18/2023    3:09 AM 04/30/2023   11:39 AM  CMP  Glucose 70 - 99 mg/dL 191  96  478   BUN 6 - 20 mg/dL 51  45  61   Creatinine 0.61 - 1.24 mg/dL 2.95  6.21  3.08   Sodium 135 - 145 mmol/L 140  141  140   Potassium 3.5 - 5.1 mmol/L 3.6  3.5  4.0   Chloride 98 - 111 mmol/L 108  108  104   CO2 22 - 32 mmol/L 24  25  24    Calcium 8.9 - 10.3 mg/dL 8.7  8.6  8.9   Total Protein 6.5 - 8.1 g/dL  5.6  6.8   Total Bilirubin 0.3 - 1.2 mg/dL  0.7  0.3   Alkaline Phos 38 - 126 U/L  33  52   AST 15 - 41 U/L  15  19   ALT 0 - 44 U/L  21  47     Assessment/Plan:   Principal Problem:   Hypertensive urgency Active Problems:   Chronic IgA nephropathy   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 male with pertinent pertinent PMH of IgA nephropathy, CKD 4, TBI, seizure severe who presented with fatigue, nausea, and vomiting and is admitted for severe asymptomatic hypertension, on hospital day 0.  Hypertensive urgency Systolic blood pressures improved from time of admission now ranging from 160-180.  He says at home his blood pressures are typically in the 125-145 range.  He is on his home antihypertensive regiment currently. -Increase hydralazine to 50 mg Q8. 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  Felt to be in the setting of dehydration, nausea vomiting.  - Continue budesonide equivalent here (tarpeyo not on formulary) -  Continue Farxiga 10 mg daily and Lokelma 10 g daily -Encourage p.o. fluid, will recheck creatinine in afternoon. -Consider nephrology consult if AKI fails to improve with p.o. 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 Code: Full   Dispo: Anticipated discharge to Home in 1 days pending resolution of AKI, hypertension.Marland Kitchen   Lovie Macadamia MD Internal Medicine Resident PGY-1 Pager: [phone] Please contact the on call pager after 5 pm and on weekends at [phone].

## 2023-07-20 DIAGNOSIS — N184 Chronic kidney disease, stage 4 (severe): Secondary | ICD-10-CM | POA: Diagnosis not present

## 2023-07-20 DIAGNOSIS — I160 Hypertensive urgency: Secondary | ICD-10-CM

## 2023-07-20 LAB — BASIC METABOLIC PANEL
Anion gap: 11 (ref 5–15)
BUN: 54 mg/dL — ABNORMAL HIGH (ref 6–20)
CO2: 21 mmol/L — ABNORMAL LOW (ref 22–32)
Calcium: 8.6 mg/dL — ABNORMAL LOW (ref 8.9–10.3)
Chloride: 106 mmol/L (ref 98–111)
Creatinine, Ser: 4.17 mg/dL — ABNORMAL HIGH (ref 0.61–1.24)
GFR, Estimated: 18 mL/min — ABNORMAL LOW (ref >60)
Glucose, Bld: 110 mg/dL — ABNORMAL HIGH (ref 70–99)
Potassium: 3.7 mmol/L (ref 3.5–5.1)
Sodium: 138 mmol/L (ref 135–145)

## 2023-07-20 LAB — GLUCOSE, CAPILLARY: Glucose-Capillary: 170 mg/dL — ABNORMAL HIGH (ref 70–99)

## 2023-07-20 MED ORDER — HYDRALAZINE HCL 25 MG PO TABS
25.0000 mg | ORAL_TABLET | Freq: Once | ORAL | Status: AC
  Administered 2023-07-20: 25 mg via ORAL
  Filled 2023-07-20: qty 1

## 2023-07-20 MED ORDER — LOKELMA 10 G PO PACK: 10.0000 g | PACK | ORAL | Status: DC

## 2023-07-20 MED ORDER — CARVEDILOL 25 MG PO TABS: 25.0000 mg | ORAL_TABLET | Freq: Two times a day (BID) | ORAL | 0 refills | Status: AC

## 2023-07-20 MED ORDER — HYDRALAZINE HCL 25 MG PO TABS: 75.0000 mg | ORAL_TABLET | Freq: Three times a day (TID) | ORAL | 0 refills | Status: DC

## 2023-07-20 MED ORDER — HYDRALAZINE HCL 50 MG PO TABS
100.0000 mg | ORAL_TABLET | Freq: Three times a day (TID) | ORAL | Status: DC
  Administered 2023-07-20 – 2023-07-23 (×9): 100 mg via ORAL
  Filled 2023-07-20 (×9): qty 2

## 2023-07-20 MED ORDER — CARVEDILOL 25 MG PO TABS
25.0000 mg | ORAL_TABLET | Freq: Two times a day (BID) | ORAL | Status: DC
  Administered 2023-07-20 – 2023-07-23 (×7): 25 mg via ORAL
  Filled 2023-07-20 (×7): qty 1

## 2023-07-20 MED ORDER — SODIUM ZIRCONIUM CYCLOSILICATE 10 G PO PACK: 10.0000 g | PACK | ORAL | Status: DC

## 2023-07-20 NOTE — Progress Notes (Signed)
Pt concerned about taking potassium supplement this evening due to his potassium usually being elevated and having to take [facility] routinely at home.  Discussed with Dr. Welton Flakes and will not give this dose and f/u with next scheduled BMP in AM. Hilton Sinclair BSN RN [facility] 07/20/2023, 12:36 AM

## 2023-07-20 NOTE — Progress Notes (Signed)
HD#1 Subjective:   Summary: Gary Cox is a 37 y.o. male with pertinent PMH of IgA nephropathy, CKD 4, TBI, seizure severe who presented with fatigue, nausea, and vomiting and is admitted for severe asymptomatic hypertension.   No acute changes overnight and no new or worsening symptoms.  Continues to have good urine output.  Objective:  Vital signs in last 24 hours: Vitals:   07/19/23 1635 07/19/23 2001 07/20/23 0123 07/20/23 0630  BP: (!) 167/109 (!) 154/113 (!) 153/97 (!) 151/101  Pulse: 89 86 93 91  Resp: 18 18 17    Temp: 98.1 F (36.7 C) 98.2 F (36.8 C) 98.3 F (36.8 C) 98.5 F (36.9 C)  TempSrc: Oral  Oral Oral  SpO2: 97% 98% 95% 98%  Weight:      Height:       Supplemental O2: Room Air SpO2: 98 %   Physical Exam:  Constitutional: in no acute distress Cardiovascular: regular rate and rhythm Pulmonary/Chest: normal work of breathing on room air, lungs clear to auscultation bilaterally MSK: No lower extremity edema Skin: warm and dry   Filed Weights   07/18/23 1403  Weight: 78.3 kg      Intake/Output Summary (Last 24 hours) at 07/20/2023 0704 Last data filed at 07/20/2023 8756 Gross per 24 hour  Intake 1240 ml  Output 0 ml  Net 1240 ml   Net IO Since Admission: 1,610 mL [07/20/23 0704]  Pertinent Labs:    Latest Ref Rng & Units 07/19/2023    4:56 AM 07/18/2023   12:59 AM 04/25/2023    1:12 AM  CBC  WBC 4.0 - 10.5 K/uL 7.8  9.6  8.2   Hemoglobin 13.0 - 17.0 g/dL 43.3  29.5  18.8   Hematocrit 39.0 - 52.0 % 35.7  37.4  37.5   Platelets 150 - 400 K/uL 202  260  240        Latest Ref Rng & Units 07/19/2023   12:40 PM 07/19/2023    4:56 AM 07/18/2023    3:09 AM  CMP  Glucose 70 - 99 mg/dL 416  606  96   BUN 6 - 20 mg/dL 49  51  45   Creatinine 0.61 - 1.24 mg/dL 3.01  6.01  0.93   Sodium 135 - 145 mmol/L 140  140  141   Potassium 3.5 - 5.1 mmol/L 3.1  3.6  3.5   Chloride 98 - 111 mmol/L 103  108  108   CO2 22 - 32 mmol/L 24   24  25    Calcium 8.9 - 10.3 mg/dL 8.8  8.7  8.6   Total Protein 6.5 - 8.1 g/dL   5.6   Total Bilirubin 0.3 - 1.2 mg/dL   0.7   Alkaline Phos 38 - 126 U/L   33   AST 15 - 41 U/L   15   ALT 0 - 44 U/L   21     Assessment/Plan:   Principal Problem:   Hypertensive urgency Active Problems:   Chronic IgA nephropathy   Localization-related (focal) (partial) idiopathic epilepsy and epileptic syndromes with seizures of localized onset, not intractable, without status epilepticus (HCC)   Chronic kidney disease   Patient Summary: Gary Cox is a 37 y.o. male with pertinent pertinent PMH of IgA nephropathy, CKD 4, TBI, seizure severe who presented with fatigue, nausea, and vomiting and is admitted for severe asymptomatic hypertension.  Severe asymptomatic hypertension Pressures improved after increasing hydralazine with systolics  in the 150s but diastolic still above 100.  We will still like to see some more improvement so we will increase carvedilol this morning.  If blood pressure stabilizes he can likely DC home this afternoon. 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  This morning creatinine is 4.17.  This is most likely within his normal creatinine fluctuation.  He has not had any change in urine output.  He did have a potassium of 3.1 and so today's dose of, was held and morning potassium is 3.7. - Continue budesonide equivalent here (tarpeyo not on formulary) - Continue Farxiga 10 mg daily and resume Lokelma 10 g daily tomorrow  Nausea and Vomiting-resolved   Focal seizures without status epilepticus - Continue to monitor for headache or other signs of  seizure - Continue Keppra 250 mg twice daily   Mild normocytic anemia Stable  Diet: Renal IVF: None,None VTE: DOAC switched from enoxaparin today. Code: Full   Dispo: Anticipated discharge to Home in this afternoon as long as no adverse response to increased carvedilol.  Rocky Morel, DO Internal Medicine Resident, PGY-2 Pager# [phone] Please contact the on call pager after 5 pm and on weekends at [phone].

## 2023-07-20 NOTE — Discharge Instructions (Addendum)
Gary Cox,  You were recently admitted to [facility] for severe hypertension. We increased the doses of some of your medications listed below.  We would like you to take your blood pressure 2-3 times a day until you see your primary care provider this month and take these readings to your appointment.  Your potassium level also dropped while you are here so we make changes to your Lokelma dosing and would like you to get an appointment to draw labs at your PCP office this coming week.  Continue taking your home medications with the following changes  Start taking Carvedilol 25 mg twice daily Hydralazine 75 mg twice daily Lokelma every Monday Wednesday and Friday   You should seek further medical care if you have any return of your symptoms.  We recommend that you see your primary care doctor in about a week to make sure that you continue to improve. We are so glad that you are feeling better.  Sincerely, Rocky Morel, DO

## 2023-07-20 NOTE — TOC Transition Note (Signed)
Transition of Care [facility]) - CM/SW Discharge Note   Patient Details  Name: Gary Cox MRN: [id number] Date of Birth: December 01, 1985  Transition of Care [facility]) CM/SW Contact:  Ronny Bacon, RN Phone Number: 07/20/2023, 11:56 AM   Clinical Narrative:  Patient is being discharge and has medicaid coverage.     Final next level of care: Home/Self Care Barriers to Discharge: No Barriers Identified   Patient Goals and CMS Choice      Discharge Placement                         Discharge Plan and Services Additional resources added to the After Visit Summary for                                       Social Determinants of Health (SDOH) Interventions SDOH Screenings   Food Insecurity: No Food Insecurity (07/18/2023)  Housing: Low Risk  (07/18/2023)  Transportation Needs: No Transportation Needs (07/18/2023)  Utilities: Not At Risk (07/18/2023)  Depression (PHQ2-9): Low Risk  (05/05/2023)  Financial Resource Strain: Medium Risk (12/19/2022)   Received from [facility], [facility]  Physical Activity: Sufficiently Active (12/19/2022)   Received from [facility], [facility]  Social Connections: Unknown (03/15/2022)   Received from [facility], [facility]  Stress: No Stress Concern Present (12/19/2022)   Received from [facility], [facility]  Tobacco Use: Medium Risk (07/17/2023)     Readmission Risk Interventions     No data to display

## 2023-07-21 DIAGNOSIS — N184 Chronic kidney disease, stage 4 (severe): Secondary | ICD-10-CM | POA: Diagnosis not present

## 2023-07-21 DIAGNOSIS — I160 Hypertensive urgency: Secondary | ICD-10-CM | POA: Diagnosis not present

## 2023-07-21 LAB — RENAL FUNCTION PANEL
Albumin: 2.8 g/dL — ABNORMAL LOW (ref 3.5–5.0)
Anion gap: 10 (ref 5–15)
BUN: 59 mg/dL — ABNORMAL HIGH (ref 6–20)
CO2: 22 mmol/L (ref 22–32)
Calcium: 8.7 mg/dL — ABNORMAL LOW (ref 8.9–10.3)
Chloride: 108 mmol/L (ref 98–111)
Creatinine, Ser: 4.38 mg/dL — ABNORMAL HIGH (ref 0.61–1.24)
GFR, Estimated: 17 mL/min — ABNORMAL LOW (ref >60)
Glucose, Bld: 128 mg/dL — ABNORMAL HIGH (ref 70–99)
Phosphorus: 4.9 mg/dL — ABNORMAL HIGH (ref 2.5–4.6)
Potassium: 3.5 mmol/L (ref 3.5–5.1)
Sodium: 140 mmol/L (ref 135–145)

## 2023-07-21 NOTE — Plan of Care (Signed)
  Problem: Nutritional: Goal: Ability to make healthy dietary choices will improve Outcome: Completed/Met

## 2023-07-21 NOTE — Consult Note (Signed)
Nephrology Consult   Assessment/Recommendations:   AKI on CKD4 -followed by Dr. Marisue Humble as outpatient. Baseline Cr around 3.2 - 4.  -AKI likely hemodynamically mediated from HTN urgency -Not much to change from a IgAN standpoint especially given preexisting severe chronic injury on his last biopsy from Jan 2024. C/w budesonide. Hold Marcelline Deist for now -no indication for renal replacement therapy at this junction. He is planning on home therapies once indicated and is following with [facility] for transplant eval -If BP and Cr are stable in the next day or so, then would be okay with discharge from a renal standpoint -Avoid nephrotoxic medications including NSAIDs and iodinated intravenous contrast exposure unless the latter is absolutely indicated.  Preferred narcotic agents for pain control are hydromorphone, fentanyl, and methadone. Morphine should not be used. Avoid Baclofen and avoid oral sodium phosphate and magnesium citrate based laxatives / bowel preps. Continue strict Input and Output monitoring. Will monitor the patient closely with you and intervene or adjust therapy as indicated by changes in clinical status/labs   HTN urgency -meds just adjusted, now on hydralazine 100mg  TID, coreg 25mg  BID, verapamil 120mg  daily. If no adequate response, then would recommend adding on chlorthalidone 25mg  daily (or lasix 40mg  BID)vs clonidine -will check renal artery duplex  Hypokalemia -typically has hyperkalemia as an outpatient. Replete PRN. Would hold lokelma on discharge  Seizure d/o -on keppra  Recommendations conveyed to primary service.  Discussed with patient's wife over patient's speakerphone.  Anthony Sar [facility] 07/21/2023 3:21 PM _____________________________________________________________________________________   History of Present Illness: Gary Cox is a/an 37 y.o. male with a past medical history of CKD 4, IgAN, HTN, seizure d/o, h/o TBI who  presents to [facility] with  N/V/fatigue. SBP at home was 199. Had a similar presentation back in June. Cr up to 4.28 in ER. Patient seen and examined bedside. Understandably, he's upset that he's here. He is also frustrated given that his BP is always hard to control. Typically SBP at home is >140. He reports that his nausea/vomiting improved. Is being worked up for transplant however has not been able to get stress test done due to high BP. He is planning on home dialysis. He reports that he has been tolerating Tarpeyo without any issues thus far. No other complaints at this time. Denies any chest pain, SOB, swelling, N/V/D.   Medications:  Current Facility-Administered Medications  Medication Dose Route Frequency Provider Last Rate Last Admin   budesonide (ENTOCORT EC) 24 hr capsule 15 mg  15 mg Oral Daily Mercie Eon, MD   15 mg at 07/21/23 0956   carvedilol (COREG) tablet 25 mg  25 mg Oral BID WC Rocky Morel, DO   25 mg at 07/21/23 1324   dapagliflozin propanediol (FARXIGA) tablet 10 mg  10 mg Oral q AM Rocky Morel, DO   10 mg at 07/21/23 4010   hydrALAZINE (APRESOLINE) tablet 100 mg  100 mg Oral Q8H Rocky Morel, DO   100 mg at 07/21/23 0524   levETIRAcetam (KEPPRA) tablet 250 mg  250 mg Oral BID Rocky Morel, DO   250 mg at 07/21/23 2725   melatonin tablet 3 mg  3 mg Oral QHS Colbert Coyer, Priscila, MD   3 mg at 07/20/23 2120   potassium chloride SA (KLOR-CON M) CR tablet 40 mEq  40 mEq Oral Once Marrianne Mood, MD       rivaroxaban Carlena Hurl) tablet 10 mg  10 mg Oral Daily Lovie Macadamia, MD   10  mg at 07/21/23 0956   verapamil (CALAN-SR) CR tablet 120 mg  120 mg Oral Daily Rocky Morel, DO   120 mg at 07/21/23 0960     ALLERGIES Pork-derived products  MEDICAL HISTORY Past Medical History:  Diagnosis Date   IgA nephropathy 08/26/2017   Renal disorder    UTI (urinary tract infection)      SOCIAL HISTORY Social History   Socioeconomic History   Marital  status: Married    Spouse name: Not on file   Number of children: Not on file   Years of education: Not on file   Highest education level: Not on file  Occupational History   Not on file  Tobacco Use   Smoking status: Former    Current packs/day: 0.00    Average packs/day: 1 pack/day for 3.0 years (3.0 ttl pk-yrs)    Types: Cigarettes    Start date: 08/26/2012    Quit date: 08/27/2015    Years since quitting: 7.9   Smokeless tobacco: Never  Vaping Use   Vaping status: Never Used  Substance and Sexual Activity   Alcohol use: No   Drug use: No   Sexual activity: Yes  Other Topics Concern   Not on file  Social History Narrative   ** Merged History Encounter **    Right handed   Caffeine-none   Social Determinants of Health   Financial Resource Strain: Medium Risk (12/19/2022)   Received from [facility], [facility]   Overall Financial Resource Strain (CARDIA)    Difficulty of Paying Living Expenses: Somewhat hard  Food Insecurity: No Food Insecurity (07/18/2023)   Hunger Vital Sign    Worried About Running Out of Food in the Last Year: Never true    Ran Out of Food in the Last Year: Never true  Transportation Needs: No Transportation Needs (07/18/2023)   PRAPARE - Administrator, Civil Service (Medical): No    Lack of Transportation (Non-Medical): No  Physical Activity: Sufficiently Active (12/19/2022)   Received from [facility], [facility]   Exercise Vital Sign    Days of Exercise per Week: 5 days    Minutes of Exercise per Session: 60 min  Stress: No Stress Concern Present (12/19/2022)   Received from [facility], [facility] Occupational [facility] - Occupational Stress Questionnaire    Feeling of Stress : Not at all  Social Connections: Unknown (03/15/2022)   Received from [facility], [facility]   Social Network    Social Network: Not on file  Intimate Partner Violence: Not At Risk (07/18/2023)   Humiliation,  Afraid, Rape, and Kick questionnaire    Fear of Current or Ex-Partner: No    Emotionally Abused: No    Physically Abused: No    Sexually Abused: No     FAMILY HISTORY Family History  Problem Relation Age of Onset   Hypertension Mother    Hypertension Father      Review of Systems: 12 systems reviewed Otherwise as per HPI, all other systems reviewed and negative  Physical Exam: Vitals:   07/21/23 0841 07/21/23 0843  BP: (!) 151/103 (!) 156/104  Pulse: 86 85  Resp:    Temp:    SpO2: 93%    Total I/O In: 240 [P.O.:240] Out: -   Intake/Output Summary (Last 24 hours) at 07/21/2023 1521 Last data filed at 07/21/2023 0815 Gross per 24 hour  Intake 1080 ml  Output 0 ml  Net 1080 ml  General: well-appearing, no acute distress, sitting up in bed HEENT: anicteric sclera, oropharynx clear without lesions CV: regular rate, normal rhythm, no murmurs, no gallops, no rubs Lungs: clear to auscultation bilaterally, normal work of breathing Abd: soft, non-tender, non-distended Skin: no visible lesions or rashes Psych: alert, engaged, appropriate mood and affect Musculoskeletal: no edema Neuro: normal speech, no gross focal deficits   Test Results Reviewed Lab Results  Component Value Date   NA 140 07/21/2023   K 3.5 07/21/2023   CL 108 07/21/2023   CO2 22 07/21/2023   BUN 59 (H) 07/21/2023   CREATININE 4.38 (H) 07/21/2023   CALCIUM 8.7 (L) 07/21/2023   ALBUMIN 2.8 (L) 07/21/2023   PHOS 4.9 (H) 07/21/2023     I have reviewed all relevant outside healthcare records related to the patient's kidney injury.

## 2023-07-21 NOTE — Discharge Summary (Signed)
Name: Gary Cox MRN: [id number] DOB: 08/24/1986 37 y.o. PCP: Massie Maroon, FNP  Date of Admission: 07/17/2023 11:21 PM Date of Discharge:  07/23/2023 Attending Physician: Dr.  Lafonda Mosses  DISCHARGE DIAGNOSIS:  Primary Problem: Hypertensive urgency   Hospital Problems: Principal Problem:   Hypertensive urgency Active Problems:   Chronic IgA nephropathy   Localization-related (focal) (partial) idiopathic epilepsy and epileptic syndromes with seizures of localized onset, not intractable, without status epilepticus (HCC)   Chronic kidney disease   Vomiting    DISCHARGE MEDICATIONS:   Allergies as of 07/23/2023       Reactions   Pork-derived Products         Medication List     STOP taking these medications    dapagliflozin propanediol 10 MG Tabs tablet Commonly known as: FARXIGA   doxycycline 100 MG capsule Commonly known as: VIBRAMYCIN   Lokelma 10 g Pack packet Generic drug: sodium zirconium cyclosilicate       TAKE these medications    carvedilol 25 MG tablet Commonly known as: COREG Take 1 tablet (25 mg total) by mouth 2 (two) times daily with a meal. What changed: how much to take   chlorthalidone 25 MG tablet Commonly known as: HYGROTON Take 1 tablet (25 mg total) by mouth daily. Start taking on: July 24, 2023   hydrALAZINE 100 MG tablet Commonly known as: APRESOLINE Take 1 tablet (100 mg total) by mouth 3 (three) times daily. What changed:  medication strength how much to take when to take this   levETIRAcetam 250 MG tablet Commonly known as: KEPPRA Take 1 tablet (250 mg total) by mouth 2 (two) times daily.   Tarpeyo 4 MG Cpdr Generic drug: Budesonide Take 16 mg by mouth daily with breakfast.   verapamil 120 MG CR tablet Commonly known as: CALAN-SR Take 1 tablet (120 mg total) by mouth daily.        DISPOSITION AND FOLLOW-UP:  Gary Cox was discharged from [facility] in Good condition. At the hospital follow up visit please address:  Follow-up Recommendations: Consults: Pt should f/u with Renal as well Labs: Basic Metabolic Profile Studies: Blood Pressure Log  Medications: Hydralazine increased to 100mg  TID, Coreg to 25mg  BID. Added Chlorthalidone. Holding Lokelma  Follow-up Appointments: PCP Follow up per patient Nephrology to follow up with patient.   HOSPITAL COURSE:  Patient Summary: Gary Cox is a 37 year old gentleman with a PPMH of IgA nephropathy, CKD 4, TBI, seizure who presented to the ED on 07/18/2023 with fatigue, nausea, and vomiting. He was admitted to the hospital for severe asymptomatic hypertension in the setting of inability to hold down PO medications. He was treated with antiemetics and was restarted on his home HTN regiment.  He was started on him home regiment which of hydralazine 25mg  BID, Corgeg 12.5mg  BID, Farxiga 10mg  daily, and Verapamil 120mg  daily. His blood pressures improved, though remained elevated. His hydralazine was increased to 100mg  Q8, and his coreg to 25mg  BID. His stay was complicated by non-oliguric AKI, which failed to responded to increased PO fluid intake. Nephrology was consulted and chlorthalidone was added for additional BP control. Gary Cox and Marcelline Deist were held and the patient will follow up closely with Nephrology outpatient. His creatinine remained stable prior to discharge, and his blood pressure were relatively well controlled.     DISCHARGE INSTRUCTIONS:   Discharge Instructions     Call MD for:  difficulty breathing, headache or visual  disturbances   Complete by: As directed    Call MD for:  difficulty breathing, headache or visual disturbances   Complete by: As directed    Call MD for:  extreme fatigue   Complete by: As directed    Call MD for:  extreme fatigue   Complete by: As directed    Call MD for:  persistant dizziness or light-headedness   Complete by: As  directed    Call MD for:  persistant dizziness or light-headedness   Complete by: As directed    Call MD for:  persistant nausea and vomiting   Complete by: As directed    Call MD for:  persistant nausea and vomiting   Complete by: As directed    Call MD for:  severe uncontrolled pain   Complete by: As directed    Call MD for:  temperature >100.4   Complete by: As directed    Diet - low sodium heart healthy   Complete by: As directed    Increase activity slowly   Complete by: As directed    Increase activity slowly   Complete by: As directed        SUBJECTIVE:  Patient continues to be asymptomatic today. We discussed with him all the medications changes which were made during this stay. He will follow up with Nephrology closely outpatient. Patient voiced understanding and agreement with the plan.   Discharge Vitals:   BP (!) 162/104 (BP Location: Left Arm)   Pulse 75   Temp 98.1 F (36.7 C) (Oral)   Resp 18   Ht 5\' 7"  (1.702 m)   Wt 78.3 kg   SpO2 98%   BMI 27.05 kg/m   OBJECTIVE:  Physical Exam Constitutional:      Appearance: Normal appearance.  HENT:     Head: Normocephalic and atraumatic.  Cardiovascular:     Rate and Rhythm: Normal rate and regular rhythm.  Pulmonary:     Effort: Pulmonary effort is normal.     Breath sounds: Normal breath sounds.  Abdominal:     General: Abdomen is flat.     Palpations: Abdomen is soft.  Neurological:     Mental Status: He is alert.  Psychiatric:        Mood and Affect: Mood normal.        Behavior: Behavior normal.      Pertinent Labs, Studies, and Procedures:     Latest Ref Rng & Units 07/19/2023    4:56 AM 07/18/2023   12:59 AM 04/25/2023    1:12 AM  CBC  WBC 4.0 - 10.5 K/uL 7.8  9.6  8.2   Hemoglobin 13.0 - 17.0 g/dL 16.1  09.6  04.5   Hematocrit 39.0 - 52.0 % 35.7  37.4  37.5   Platelets 150 - 400 K/uL 202  260  240        Latest Ref Rng & Units 07/23/2023    8:53 AM 07/22/2023    5:59 AM 07/21/2023     8:28 AM  CMP  Glucose 70 - 99 mg/dL 409  811  914   BUN 6 - 20 mg/dL 79  69  59   Creatinine 0.61 - 1.24 mg/dL 7.82  9.56  2.13   Sodium 135 - 145 mmol/L 137  134  140   Potassium 3.5 - 5.1 mmol/L 3.9  3.7  3.5   Chloride 98 - 111 mmol/L 103  102  108   CO2 22 - 32 mmol/L 21  19  22   Calcium 8.9 - 10.3 mg/dL 8.9  8.8  8.7     DG CHEST PORT 1 VIEW  Result Date: 07/18/2023 CLINICAL DATA:  [id number] Hypertensive urgency [id number]. EXAM: PORTABLE CHEST 1 VIEW COMPARISON:  Chest radiograph 07/18/2023 at 0106 hours. FINDINGS: 1031 hours. Clear lungs. Normal heart size and mediastinal contours. No pleural effusion or pneumothorax. Visualized bones and upper abdomen are unremarkable. IMPRESSION: No evidence of acute cardiopulmonary disease./ Electronically Signed   By: Orvan Falconer M.D.   On: 07/18/2023 12:13   DG Chest 2 View  Result Date: 07/18/2023 CLINICAL DATA:  Hypertension. EXAM: CHEST - 2 VIEW COMPARISON:  September 02, 2017 FINDINGS: The heart size and mediastinal contours are within normal limits. Both lungs are clear. The visualized skeletal structures are unremarkable. IMPRESSION: No active cardiopulmonary disease. Electronically Signed   By: Aram Candela M.D.   On: 07/18/2023 01:33     Signed: Lovie Macadamia MD Internal Medicine Resident, PGY-1 [facility]  Pager: [phone] 2:36 PM, 07/23/2023

## 2023-07-21 NOTE — Progress Notes (Signed)
HD#2 Subjective:   Summary: Gary Cox is a 37 y.o. male with pertinent PMH of IgA nephropathy, CKD 4, TBI, seizure severe who presented with fatigue, nausea, and vomiting and is admitted for severe asymptomatic hypertension.   Overnight Events: Patient's blood pressures improved overnight though remain elevated, Creatinine worsee today   We discussed with the patient that his HTN has been difficult to manage and his creatinine has increased this morning. The patient has been very frustrated with his health issues in the past. He is a young gentleman who has had a tumultuous course with his nephropathy. He has been frustrated by a lack of answers and clear directions. We discussed with the patient that we will be getting nephrology involved and will try and get everyone on the same page.   Objective:  Vital signs in last 24 hours: Vitals:   07/21/23 0505 07/21/23 0840 07/21/23 0841 07/21/23 0843  BP: (!) 140/95 (!) 162/109 (!) 151/103 (!) 156/104  Pulse: 81 87 86 85  Resp:  18    Temp: 98.1 F (36.7 C) 98.6 F (37 C)    TempSrc: Oral     SpO2: 95% 97% 93%   Weight:      Height:       Supplemental O2: Room Air SpO2: 93 %   Physical Exam:  Constitutional: in no acute distress Cardiovascular: regular rate and rhythm, no m/r/g Pulmonary/Chest: normal work of breathing on room air, crackles at the lung base bilaterally Abdominal: soft, non-tender, non-distended, positive bowel sounds Skin: warm and dry. No LE edema.    Filed Weights   07/18/23 1403  Weight: 78.3 kg      Intake/Output Summary (Last 24 hours) at 07/21/2023 1353 Last data filed at 07/21/2023 0815 Gross per 24 hour  Intake 1080 ml  Output 0 ml  Net 1080 ml   Net IO Since Admission: 3,170 mL [07/21/23 1353]  Pertinent Labs:    Latest Ref Rng & Units 07/19/2023    4:56 AM 07/18/2023   12:59 AM 04/25/2023    1:12 AM  CBC  WBC 4.0 - 10.5 K/uL 7.8  9.6  8.2   Hemoglobin 13.0 - 17.0  g/dL 65.7  84.6  96.2   Hematocrit 39.0 - 52.0 % 35.7  37.4  37.5   Platelets 150 - 400 K/uL 202  260  240        Latest Ref Rng & Units 07/21/2023    8:28 AM 07/20/2023    8:50 AM 07/19/2023   12:40 PM  CMP  Glucose 70 - 99 mg/dL 952  841  324   BUN 6 - 20 mg/dL 59  54  49   Creatinine 0.61 - 1.24 mg/dL 4.01  0.27  2.53   Sodium 135 - 145 mmol/L 140  138  140   Potassium 3.5 - 5.1 mmol/L 3.5  3.7  3.1   Chloride 98 - 111 mmol/L 108  106  103   CO2 22 - 32 mmol/L 22  21  24    Calcium 8.9 - 10.3 mg/dL 8.7  8.6  8.8     Assessment/Plan:   Principal Problem:   Hypertensive urgency Active Problems:   Chronic IgA nephropathy   Localization-related (focal) (partial) idiopathic epilepsy and epileptic syndromes with seizures of localized onset, not intractable, without status epilepticus (HCC)   Chronic kidney disease   Patient Summary: Gary Cox is a 37 y.o. male with pertinent pertinent PMH of IgA nephropathy,  CKD 4, TBI, seizure severe who presented with fatigue, nausea, and vomiting and is admitted for severe asymptomatic hypertension, on hospital day 2.  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 Creatinine elevated and increased today (3.70 on admit -> 4.38 today). Originally the patient presented with HTN in the setting of vomiting up his BP medications. His creatinine bumped slightly following admission. We felt this was likely in the setting of decreased PO, and his creatinine did improved with increased fluid intake. However, the patient's creatinine again began to creep up over the last day. The patient expressed frustration with their disease course and the many medication changes which have been made in the past.   - Given the increasing creatinine and refractory hypertension in this patient who has preexisting kidney disease with little reserve, we have consulted  nephrology. - Continue budesonide equivalent here (tarpeyo not on formulary) - Continue Farxiga 10 mg daily  - Holding Lokelma today -Consider nephrology consult if AKI fails to improve with p.o. fluid  Hypertensive urgency Systolic blood pressures improved from time of admission now ranging from 160-130.  He says at home his blood pressures are typically in the 125-145 range. His blood pressure has been relatively refractory to dose increases.  - Increased hydralazine to 100 mg Q8, and carvedilol 25 mg twice daily - Continue verapamil 120 mg daily. - Continue telemetry   Nausea and Vomiting-resolved  Focal seizures without status epilepticus - Continue to monitor for headache or other signs of seizure - Continue Keppra 250 mg twice daily   Mild normocytic anemia Stable  Diet: Renal IVF: None,None VTE: DOAC switched from enoxaparin today. Code: Full  Dispo: Anticipated discharge to Home in 1-2 days pending resolution of AKI, hypertension.Marland Kitchen   Lovie Macadamia MD Internal Medicine Resident PGY-1 Pager: [phone] Please contact the on call pager after 5 pm and on weekends at [phone].

## 2023-07-21 NOTE — Plan of Care (Signed)
  Problem: Education: °Goal: Knowledge of General Education information will improve °Description: Including pain rating scale, medication(s)/side effects and non-pharmacologic comfort measures °Outcome: Completed/Met °  °

## 2023-07-22 ENCOUNTER — Inpatient Hospital Stay (HOSPITAL_COMMUNITY): Payer: Medicaid Other

## 2023-07-22 DIAGNOSIS — N184 Chronic kidney disease, stage 4 (severe): Secondary | ICD-10-CM | POA: Diagnosis not present

## 2023-07-22 DIAGNOSIS — I160 Hypertensive urgency: Secondary | ICD-10-CM

## 2023-07-22 LAB — RENAL FUNCTION PANEL
Albumin: 2.8 g/dL — ABNORMAL LOW (ref 3.5–5.0)
Anion gap: 13 (ref 5–15)
BUN: 69 mg/dL — ABNORMAL HIGH (ref 6–20)
CO2: 19 mmol/L — ABNORMAL LOW (ref 22–32)
Calcium: 8.8 mg/dL — ABNORMAL LOW (ref 8.9–10.3)
Chloride: 102 mmol/L (ref 98–111)
Creatinine, Ser: 4.44 mg/dL — ABNORMAL HIGH (ref 0.61–1.24)
GFR, Estimated: 17 mL/min — ABNORMAL LOW (ref >60)
Glucose, Bld: 103 mg/dL — ABNORMAL HIGH (ref 70–99)
Phosphorus: 5.8 mg/dL — ABNORMAL HIGH (ref 2.5–4.6)
Potassium: 3.7 mmol/L (ref 3.5–5.1)
Sodium: 134 mmol/L — ABNORMAL LOW (ref 135–145)

## 2023-07-22 MED ORDER — CHLORTHALIDONE 25 MG PO TABS
25.0000 mg | ORAL_TABLET | Freq: Every day | ORAL | Status: DC
  Administered 2023-07-22 – 2023-07-23 (×2): 25 mg via ORAL
  Filled 2023-07-22 (×2): qty 1

## 2023-07-22 NOTE — Plan of Care (Signed)
  Problem: Clinical Measurements: Goal: Complications related to the disease process, condition or treatment will be avoided or minimized Outcome: Progressing   

## 2023-07-22 NOTE — Plan of Care (Signed)
Problem: Clinical Measurements: Goal: Complications related to the disease process, condition or treatment will be avoided or minimized Outcome: Progressing

## 2023-07-22 NOTE — Progress Notes (Signed)
Spoke to patient & his wife today to address several concerns:   1. Wife & patient very concerned about leaving the hospital before blood pressure is fully normal- We will start Chlorthalidone now, see how his body adjusts to new regimen, and will have close outpatient Nephrology follow up. I'm okay sending him before blood pressure is perfect. It will be important to monitor home blood pressures and bring these readings to his appointment. 2. Wife concerned we stopped Jardiance- This is on "pause" for right now. Can talk to outpatient nephrologist about the risk/benefit of restarting it once GFR settles out. [place] Pepper is an excellent medicine for CKD, but has not been studied as much in very advanced CKD, so it might not be safe for him to continue.  3. Wife concerned about starting chlorthalidone- Explained how this is an excellent BP medicine and IM/renal both recommend it.   Tentative plan is to discharge patient tomorrow 9/18.

## 2023-07-22 NOTE — Progress Notes (Signed)
[facility] Progress Note    Assessment/ Plan:   AKI on CKD4 -followed by Dr. Marisue Humble as outpatient. Baseline Cr around 3.2 - 4.  -AKI likely hemodynamically mediated from HTN urgency -Not much to change from a IgAN standpoint especially given preexisting severe chronic injury on his last biopsy from Jan 2024. C/w budesonide. Hold SGLT2i for now -no indication for renal replacement therapy at this junction. He is planning on home therapies once indicated and is following with [facility] for transplant eval -Cr relatively stable today -Avoid nephrotoxic medications including NSAIDs and iodinated intravenous contrast exposure unless the latter is absolutely indicated.  Preferred narcotic agents for pain control are hydromorphone, fentanyl, and methadone. Morphine should not be used. Avoid Baclofen and avoid oral sodium phosphate and magnesium citrate based laxatives / bowel preps. Continue strict Input and Output monitoring. Will monitor the patient closely with you and intervene or adjust therapy as indicated by changes in clinical status/labs    HTN urgency -renal artery duplex neg for RAS -agree with adding chlorthalidone 25mg  daily today. Next step (if needed) would be to consider clonidine   Hypokalemia -typically has hyperkalemia as an outpatient. Replete PRN. Would hold lokelma on discharge. Explained to patient that he may not need lokelma as an outpatient given that he is starting on chlorthalidone   Seizure d/o -on keppra   Discussed with primary service.  Subjective:   Patient seen and examined bedside. No acute events. No complaints Wants to stay an extra day given that he is starting on chlorthalidone   Objective:   BP (!) 158/102 (BP Location: Left Arm)   Pulse 75   Temp 98 F (36.7 C) (Oral)   Resp 18   Ht 5\' 7"  (1.702 m)   Wt 78.3 kg   SpO2 93%   BMI 27.05 kg/m   Intake/Output Summary (Last 24 hours) at 07/22/2023 1325 Last data filed at 07/22/2023  0900 Gross per 24 hour  Intake 240 ml  Output 0 ml  Net 240 ml   Weight change:   Physical Exam: Gen: NAD CVS: RRR Resp: normal wob Abd: soft, nd Ext: no edema Neuro: awake, alert  Imaging: VAS US RENAL ARTERY DUPLEX  Result Date: 07/22/2023 ABDOMINAL VISCERAL Patient Name:  Gary Cox [facility]  Date of Exam:   07/22/2023 Medical Rec #: [id number]                      Accession #:    [id number] Date of Birth: [birth year]                      Patient Gender: M Patient Age:   8 years Exam Location:  [facility] Procedure:      VAS US RENAL ARTERY DUPLEX Referring Phys: -------------------------------------------------------------------------------- Indications: Severe hypertension High Risk Factors: Hypertension. 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 Performing Technologist: Shona Simpson Supporting Technologist: Marilynne Halsted RDMS, RVT  Examination Guidelines: A complete evaluation includes B-mode imaging, spectral Doppler, color Doppler, and power Doppler as needed of all accessible portions of each vessel. Bilateral testing is considered an integral part of a complete examination. Limited examinations for reoccurring indications may be performed as noted.  Duplex Findings: +--------------------+--------+--------+------+--------+ Mesenteric          PSV cm/sEDV cm/sPlaqueComments +--------------------+--------+--------+------+--------+ Aorta Mid             119                          +--------------------+--------+--------+------+--------+  Celiac Artery Origin  144                          +--------------------+--------+--------+------+--------+ SMA Proximal          188                          +--------------------+--------+--------+------+--------+    +------------------+--------+--------+-------+ Right Renal ArteryPSV cm/sEDV cm/sComment +------------------+--------+--------+-------+ Origin               79      23            +------------------+--------+--------+-------+ Proximal             58      18           +------------------+--------+--------+-------+ Mid                  52      17           +------------------+--------+--------+-------+ Distal               59      18           +------------------+--------+--------+-------+ +-----------------+--------+--------+-------+ Left Renal ArteryPSV cm/sEDV cm/sComment +-----------------+--------+--------+-------+ Origin              62      17           +-----------------+--------+--------+-------+ Proximal            76      28           +-----------------+--------+--------+-------+ Mid                 68      18           +-----------------+--------+--------+-------+ Distal              65      17           +-----------------+--------+--------+-------+ +------------+--------+--------+----+-----------+--------+--------+----+ Right KidneyPSV cm/sEDV cm/sRI  Left KidneyPSV cm/sEDV cm/sRI   +------------+--------+--------+----+-----------+--------+--------+----+ Upper Pole  15      7       0.53Upper Pole 24      11      0.56 +------------+--------+--------+----+-----------+--------+--------+----+ Mid         21      10      0.        25      8       0.68 +------------+--------+--------+----+-----------+--------+--------+----+ Lower Pole  18      9       0.49Lower Pole 17      8       0.50 +------------+--------+--------+----+-----------+--------+--------+----+ Hilar       23      8       0.66Hilar      28      9       0.66 +------------+--------+--------+----+-----------+--------+--------+----+ +------------------+----+------------------+----+ Right Kidney          Left Kidney            +------------------+----+------------------+----+ RAR                   RAR                    +------------------+----+------------------+----+ RAR (manual)      1.5 RAR (manual)      1.5   +------------------+----+------------------+----+ Cortex  Cortex                 +------------------+----+------------------+----+ Cortex thickness      Corex thickness        +------------------+----+------------------+----+ Kidney length (cm)9.34Kidney length (cm)9.79 +------------------+----+------------------+----+   Summary: Renal:  Right: No evidence of right renal artery stenosis. Normal right        Resisitive Index. Cyst(s) noted. Normal size right kidney.        Increased echogenicity of right kidney. Left:  No evidence of left renal artery stenosis. Normal left        Resistive Index. Normal size of left kidney. Cyst(s) noted.        increased chogencity of the left kidney.  *See table(s) above for measurements and observations.     Preliminary     Labs: BMET Recent Labs  Lab 07/18/23 0309 07/19/23 0456 07/19/23 1240 07/20/23 0850 07/21/23 0828 07/22/23 0559  NA 141 140 140 138 140 134*  K 3.5 3.6 3.1* 3.7 3.5 3.7  CL 108 108 103 106 108 102  CO2 25 24 24  21* 22 19*  GLUCOSE 96 112* 108* 110* 128* 103*  BUN 45* 51* 49* 54* 59* 69*  CREATININE 3.70* 4.28* 4.04* 4.17* 4.38* 4.44*  CALCIUM 8.6* 8.7* 8.8* 8.6* 8.7* 8.8*  PHOS  --  4.8*  --   --  4.9* 5.8*   CBC Recent Labs  Lab 07/18/23 0059 07/19/23 0456  WBC 9.6 7.8  NEUTROABS 6.6  --   HGB 12.8* 12.4*  HCT 37.4* 35.7*  MCV 83.9 84.6  PLT 260 202    Medications:     budesonide  15 mg Oral Daily   carvedilol  25 mg Oral BID WC   chlorthalidone  25 mg Oral Daily   hydrALAZINE  100 mg Oral Q8H   levETIRAcetam  250 mg Oral BID   melatonin  3 mg Oral QHS   potassium chloride  40 mEq Oral Once   rivaroxaban  10 mg Oral Daily   verapamil  120 mg Oral Daily      Anthony Sar, MD [facility] 07/22/2023, 1:25 PM

## 2023-07-22 NOTE — Progress Notes (Signed)
Renal arterial duplex completed. Please see CV Procedures for preliminary results.  Shona Simpson, RVT 07/22/23 9:52 AM

## 2023-07-22 NOTE — Progress Notes (Signed)
HD#3 Subjective:   Summary: Gary Cox is a 37 y.o. male with pertinent PMH of IgA nephropathy, CKD 4, TBI, seizure severe who presented with fatigue, nausea, and vomiting and is admitted for severe asymptomatic hypertension.   Patient was seen by Nephrology yesterday. Appreciate their input greatly. Extensive discussion with the patient and patient's wife regarding Gary Cox medication changes, blood pressure, and kidney function. All questions answered.  Objective:  Vital signs in last 24 hours: Vitals:   07/22/23 0622 07/22/23 0800 07/22/23 0824 07/22/23 1146  BP: (!) 141/93 (!) 152/98  (!) 158/102  Pulse: 73  75   Resp: 18     Temp: 98 F (36.7 C)     TempSrc: Oral     SpO2: 93%     Weight:      Height:       Supplemental O2: Room Air SpO2: 93 %   Physical Exam:  Constitutional: in no acute distress Cardiovascular: regular rate and rhythm, no m/r/g Pulmonary/Chest: normal work of breathing on room air, crackles at the lung base bilaterally Abdominal: soft, non-tender, non-distended, positive bowel sounds Skin: warm and dry. No LE edema.    Filed Weights   07/18/23 1403  Weight: 78.3 kg      Intake/Output Summary (Last 24 hours) at 07/22/2023 1417 Last data filed at 07/22/2023 0900 Gross per 24 hour  Intake 240 ml  Output 0 ml  Net 240 ml   Net IO Since Admission: 3,410 mL [07/22/23 1417]  Pertinent Labs:    Latest Ref Rng & Units 07/19/2023    4:56 AM 07/18/2023   12:59 AM 04/25/2023    1:12 AM  CBC  WBC 4.0 - 10.5 K/uL 7.8  9.6  8.2   Hemoglobin 13.0 - 17.0 g/dL 91.4  78.2  95.6   Hematocrit 39.0 - 52.0 % 35.7  37.4  37.5   Platelets 150 - 400 K/uL 202  260  240        Latest Ref Rng & Units 07/22/2023    5:59 AM 07/21/2023    8:28 AM 07/20/2023    8:50 AM  CMP  Glucose 70 - 99 mg/dL 213  086  578   BUN 6 - 20 mg/dL 69  59  54   Creatinine 0.61 - 1.24 mg/dL 4.69  6.29  5.28   Sodium 135 - 145 mmol/L 134  140  138    Potassium 3.5 - 5.1 mmol/L 3.7  3.5  3.7   Chloride 98 - 111 mmol/L 102  108  106   CO2 22 - 32 mmol/L 19  22  21    Calcium 8.9 - 10.3 mg/dL 8.8  8.7  8.6     Assessment/Plan:   Principal Problem:   Hypertensive urgency Active Problems:   Chronic IgA nephropathy   Localization-related (focal) (partial) idiopathic epilepsy and epileptic syndromes with seizures of localized onset, not intractable, without status epilepticus (HCC)   Chronic kidney disease   Patient Summary: Gary Cox is a 37 y.o. male with pertinent pertinent PMH of IgA nephropathy, CKD 4, TBI, seizure severe who presented with fatigue, nausea, and vomiting and is admitted for severe asymptomatic hypertension, on hospital day 3.  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 In short - AKI felt to be 2-2 HTN. Unlikely to reflect evolution of their IgAN. Kidney function relatively stable today.  - Holding SGLT-2 for now - Initiate Chlorthalidone  25mg  for additional HTN control. - Renal duplex relatively unremarkable  Hypertensive urgency Systolic blood pressures improved from time of admission now ranging from 162- 128.  He says at home his blood pressures are typically in the 125-145 range.  - hydralazine 100 mg Q8 -- carvedilol 25 mg twice daily - Continue verapamil 120 mg daily -- Adding chlorthalidone 25mg  - Continue telemetry   Nausea and Vomiting-resolved  Focal seizures without status epilepticus - Continue to monitor for headache or other signs of seizure - Continue Keppra 250 mg twice daily   Mild normocytic anemia Stable  Diet: Renal IVF: None,None VTE: DOAC Code: Full  Dispo: Anticipated discharge to Home in 1 days pending stability of BP and kidney function. Lovie Macadamia MD Internal Medicine Resident PGY-1 Pager: [phone] Please contact the on call pager after 5 pm and on weekends at  [phone].

## 2023-07-23 ENCOUNTER — Other Ambulatory Visit (HOSPITAL_COMMUNITY): Payer: Self-pay

## 2023-07-23 DIAGNOSIS — R1110 Vomiting, unspecified: Secondary | ICD-10-CM | POA: Diagnosis not present

## 2023-07-23 DIAGNOSIS — N184 Chronic kidney disease, stage 4 (severe): Secondary | ICD-10-CM | POA: Diagnosis not present

## 2023-07-23 DIAGNOSIS — I160 Hypertensive urgency: Secondary | ICD-10-CM | POA: Diagnosis not present

## 2023-07-23 MED ORDER — HYDRALAZINE HCL 100 MG PO TABS
100.0000 mg | ORAL_TABLET | Freq: Three times a day (TID) | ORAL | 0 refills | Status: AC
  Filled 2023-07-23 – 2023-07-24 (×2): qty 90, 30d supply, fill #0

## 2023-07-23 MED ORDER — CHLORTHALIDONE 25 MG PO TABS
25.0000 mg | ORAL_TABLET | Freq: Every day | ORAL | 0 refills | Status: DC
  Filled 2023-07-23 – 2023-07-24 (×2): qty 30, 30d supply, fill #0

## 2023-07-23 NOTE — TOC Transition Note (Addendum)
Transition of Care [facility]) - CM/SW Discharge Note   Patient Details  Name: Gary Cox MRN: [id number] Date of Birth: November 14, 1985  Transition of Care [facility]) CM/SW Contact:  Tom-Johnson, Hershal Coria, RN Phone Number: 07/23/2023, 1:45 PM   Clinical Narrative:     Patient is scheduled for discharge today.  Readmission Risk Assessment done. Outpatient f/u, hospital f/u and discharge instructions on AVS. CM consulted for Medication Assistance, patient has Medicaid which pays for all his meds. Patient states he can afford his co-payments at this time. Wife, [place] to transport at discharge.  No further TOC needs noted.         Final next level of care: Home/Self Care Barriers to Discharge: No Barriers Identified   Patient Goals and CMS Choice      Discharge Placement                         Discharge Plan and Services Additional resources added to the After Visit Summary for                                       Social Determinants of Health (SDOH) Interventions SDOH Screenings   Food Insecurity: No Food Insecurity (07/18/2023)  Housing: Low Risk  (07/18/2023)  Transportation Needs: No Transportation Needs (07/18/2023)  Utilities: Not At Risk (07/18/2023)  Depression (PHQ2-9): Low Risk  (05/05/2023)  Financial Resource Strain: Medium Risk (12/19/2022)   Received from [facility], [facility]  Physical Activity: Sufficiently Active (12/19/2022)   Received from [facility], [facility]  Social Connections: Unknown (03/15/2022)   Received from [facility], [facility]  Stress: No Stress Concern Present (12/19/2022)   Received from [facility], [facility]  Tobacco Use: Medium Risk (07/17/2023)     Readmission Risk Interventions    07/23/2023    1:44 PM  Readmission Risk Prevention Plan  Transportation Screening Complete  PCP or Specialist Appt within 5-7 Days Complete  Home Care Screening Complete  Medication Review  (RN CM) Referral to Pharmacy

## 2023-07-23 NOTE — Progress Notes (Signed)
[facility] Progress Note    Assessment/ Plan:   AKI on CKD4 -followed by Dr. Marisue Humble as outpatient. Baseline Cr around 3.2 - 4.  -AKI likely hemodynamically mediated from HTN urgency -Not much to change from a IgAN standpoint especially given preexisting severe chronic injury on his last biopsy from Jan 2024. C/w budesonide. Hold SGLT2i for now. Not a candidate for RAAS inhibition given low eGFR -no indication for renal replacement therapy at this junction. He is planning on home therapies once indicated and is following with [facility] for transplant eval -Cr relatively stable today -Avoid nephrotoxic medications including NSAIDs and iodinated intravenous contrast exposure unless the latter is absolutely indicated.  Preferred narcotic agents for pain control are hydromorphone, fentanyl, and methadone. Morphine should not be used. Avoid Baclofen and avoid oral sodium phosphate and magnesium citrate based laxatives / bowel preps. Continue strict Input and Output monitoring. Will monitor the patient closely with you and intervene or adjust therapy as indicated by changes in clinical status/labs    HTN urgency -renal artery duplex neg for RAS -BP trends better -if an additional agent is needed, then may need to consider clonidine -budesonide causing higher BPs? Will need to reassess this as an OP   Hypokalemia -typically has hyperkalemia as an outpatient. Replete PRN. Would hold lokelma on discharge. Hopefully chlorthalidone will keep his K stable/WNL   Seizure d/o -on keppra   Discussed with primary service. From a nephrology perspective, okay for discharge. Will arrange for follow up at the office in a week for follow up. Advised him to keep a log of his home BP readings for [place] to review together.  Subjective:   Patient seen and examined bedside. No acute events. No complaints. He did notice that he urinated a little more with chlorthalidone    Objective:   BP (!) 162/104 (BP  Location: Left Arm)   Pulse 75   Temp 98.1 F (36.7 C) (Oral)   Resp 18   Ht 5\' 7"  (1.702 m)   Wt 78.3 kg   SpO2 98%   BMI 27.05 kg/m   Intake/Output Summary (Last 24 hours) at 07/23/2023 1059 Last data filed at 07/23/2023 1610 Gross per 24 hour  Intake 720 ml  Output 0 ml  Net 720 ml   Weight change:   Physical Exam: Gen: NAD CVS: RRR Resp: normal wob Abd: soft, nd Ext: no edema Neuro: awake, alert, speech clear and coherent  Imaging: VAS US RENAL ARTERY DUPLEX  Result Date: 07/22/2023 ABDOMINAL VISCERAL Patient Name:  Gary Cox [facility] [place]  Date of Exam:   07/22/2023 Medical Rec #: [id number]                      Accession #:    [id number] Date of Birth: [birth year]                      Patient Gender: M Patient Age:   37 years Exam Location:  [facility] Procedure:      VAS US RENAL ARTERY DUPLEX Referring Phys: -------------------------------------------------------------------------------- Indications: Severe hypertension High Risk Factors: Hypertension. 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 Performing Technologist: Shona Simpson Supporting Technologist: Marilynne Halsted RDMS, RVT  Examination Guidelines: A complete evaluation includes B-mode imaging, spectral Doppler, color Doppler, and power Doppler as needed of all accessible portions of each vessel. Bilateral testing is considered an integral part of a complete examination. Limited examinations for reoccurring indications may be performed as  noted.  Duplex Findings: +--------------------+--------+--------+------+--------+ Mesenteric          PSV cm/sEDV cm/sPlaqueComments +--------------------+--------+--------+------+--------+ Aorta Mid             119                          +--------------------+--------+--------+------+--------+ Celiac Artery Origin  144                          +--------------------+--------+--------+------+--------+ SMA Proximal          188                           +--------------------+--------+--------+------+--------+    +------------------+--------+--------+-------+ Right Renal ArteryPSV cm/sEDV cm/sComment +------------------+--------+--------+-------+ Origin               79      23           +------------------+--------+--------+-------+ Proximal             58      18           +------------------+--------+--------+-------+ Mid                  52      17           +------------------+--------+--------+-------+ Distal               59      18           +------------------+--------+--------+-------+ +-----------------+--------+--------+-------+ Left Renal ArteryPSV cm/sEDV cm/sComment +-----------------+--------+--------+-------+ Origin              62      17           +-----------------+--------+--------+-------+ Proximal            76      28           +-----------------+--------+--------+-------+ Mid                 68      18           +-----------------+--------+--------+-------+ Distal              65      17           +-----------------+--------+--------+-------+ +------------+--------+--------+----+-----------+--------+--------+----+ Right KidneyPSV cm/sEDV cm/sRI  Left KidneyPSV cm/sEDV cm/sRI   +------------+--------+--------+----+-----------+--------+--------+----+ Upper Pole  15      7       0.53Upper Pole 24      11      0.56 +------------+--------+--------+----+-----------+--------+--------+----+ Mid         21      10      0.        25      8       0.68 +------------+--------+--------+----+-----------+--------+--------+----+ Lower Pole  18      9       0.49Lower Pole 17      8       0.50 +------------+--------+--------+----+-----------+--------+--------+----+ Hilar       23      8       0.66Hilar      28      9       0.66 +------------+--------+--------+----+-----------+--------+--------+----+ +------------------+----+------------------+----+ Right Kidney           Left Kidney            +------------------+----+------------------+----+ RAR  RAR                    +------------------+----+------------------+----+ RAR (manual)      1.5 RAR (manual)      1.5  +------------------+----+------------------+----+ Cortex                Cortex                 +------------------+----+------------------+----+ Cortex thickness      Corex thickness        +------------------+----+------------------+----+ Kidney length (cm)9.34Kidney length (cm)9.79 +------------------+----+------------------+----+  Summary: Renal:  Right: No evidence of right renal artery stenosis. Normal right        Resisitive Index. Cyst(s) noted. Normal size right kidney.        Increased echogenicity of right kidney. Left:  No evidence of left renal artery stenosis. Normal left        Resistive Index. Normal size of left kidney. Cyst(s) noted.        increased chogencity of the left kidney.  *See table(s) above for measurements and observations.  Diagnosing physician: Waverly Ferrari MD  Electronically signed by Waverly Ferrari MD on 07/22/2023 at 4:50:04 PM.    Final     Labs: BMET Recent Labs  Lab 07/18/23 0309 07/19/23 1610 07/19/23 1240 07/20/23 0850 07/21/23 0828 07/22/23 0559 07/23/23 0853  NA 141 140 140 138 140 134* 137  K 3.5 3.6 3.1* 3.7 3.5 3.7 3.9  CL 108 108 103 106 108 102 103  CO2 25 24 24  21* 22 19* 21*  GLUCOSE 96 112* 108* 110* 128* 103* 114*  BUN 45* 51* 49* 54* 59* 69* 79*  CREATININE 3.70* 4.28* 4.04* 4.17* 4.38* 4.44* 4.54*  CALCIUM 8.6* 8.7* 8.8* 8.6* 8.7* 8.8* 8.9  PHOS  --  4.8*  --   --  4.9* 5.8* 5.8*   CBC Recent Labs  Lab 07/18/23 0059 07/19/23 0456  WBC 9.6 7.8  NEUTROABS 6.6  --   HGB 12.8* 12.4*  HCT 37.4* 35.7*  MCV 83.9 84.6  PLT 260 202    Medications:     budesonide  15 mg Oral Daily   carvedilol  25 mg Oral BID WC   chlorthalidone  25 mg Oral Daily   hydrALAZINE  100 mg Oral Q8H    levETIRAcetam  250 mg Oral BID   melatonin  3 mg Oral QHS   potassium chloride  40 mEq Oral Once   rivaroxaban  10 mg Oral Daily   verapamil  120 mg Oral Daily      Anthony Sar, MD [facility] 07/23/2023, 10:59 AM

## 2023-07-24 ENCOUNTER — Other Ambulatory Visit (HOSPITAL_COMMUNITY): Payer: Self-pay

## 2023-07-24 ENCOUNTER — Telehealth: Payer: Self-pay

## 2023-07-24 NOTE — Transitions of Care (Post Inpatient/ED Visit) (Signed)
07/24/2023  Name: Gary Cox MRN: [id number] DOB: [birth year]  Today's TOC FU Call Status: Today's TOC FU Call Status:: Successful TOC FU Call Completed TOC FU Call Complete Date: 07/24/23 Patient's Name and Date of Birth confirmed.  Transition Care Management Follow-up Telephone Call Date of Discharge: 07/23/23 Discharge Facility: [facility]) Type of Discharge: Inpatient Admission Primary Inpatient Discharge Diagnosis:: hypertension How have you been since you were released from the hospital?: Better Any questions or concerns?: No  Items Reviewed: Did you receive and understand the discharge instructions provided?: Yes Medications obtained,verified, and reconciled?: Yes (Medications Reviewed) Any new allergies since your discharge?: No Dietary orders reviewed?: Yes Do you have support at home?: Yes People in Home: spouse  Medications Reviewed Today: Medications Reviewed Today     Reviewed by Karena Addison, LPN (Licensed Practical Nurse) on 07/24/23 at 1309  Med List Status: <None>   Medication Order Taking? Sig Documenting Provider Last Dose Status Informant  carvedilol (COREG) 25 MG tablet [id number]  Take 1 tablet (25 mg total) by mouth 2 (two) times daily with a meal. Rocky Morel, DO  Active   chlorthalidone (HYGROTON) 25 MG tablet [id number]  Take 1 tablet (25 mg total) by mouth daily. Lovie Macadamia, MD  Active   hydrALAZINE (APRESOLINE) 100 MG tablet [id number]  Take 1 tablet (100 mg total) by mouth 3 (three) times daily. Lovie Macadamia, MD  Active   levETIRAcetam (KEPPRA) 250 MG tablet [id number] No Take 1 tablet (250 mg total) by mouth 2 (two) times daily. Windell Norfolk, MD 07/17/2023 Active Self  TARPEYO 4 MG CPDR [id number] No Take 16 mg by mouth daily with breakfast. [provider] 07/17/2023 Active Self  verapamil (CALAN-SR) 120 MG CR tablet [id number] No Take 1 tablet (120 mg total) by mouth daily. Sherryll Burger, Pratik D, DO  07/17/2023 Expired 07/18/23 2359 Self            Home Care and Equipment/Supplies: Were Home Health Services Ordered?: NA Any new equipment or medical supplies ordered?: NA  Functional Questionnaire: Do you need assistance with bathing/showering or dressing?: No Do you need assistance with meal preparation?: No Do you need assistance with eating?: No Do you have difficulty maintaining continence: No Do you need assistance with getting out of bed/getting out of a chair/moving?: No Do you have difficulty managing or taking your medications?: No  Follow up appointments reviewed: PCP Follow-up appointment confirmed?: No (no avail appts, sent message to staff to schedule) MD Provider Line [phone] Given: No Specialist Hospital Follow-up appointment confirmed?: Yes Date of Specialist follow-up appointment?: 08/05/23 Follow-Up Specialty Provider:: nephro Do you need transportation to your follow-up appointment?: No Do you understand care options if your condition(s) worsen?: Yes-patient verbalized understanding    SIGNATURE Karena Addison, LPN [facility] Nurse Health Advisor Direct Dial [phone]

## 2023-07-30 ENCOUNTER — Emergency Department (HOSPITAL_COMMUNITY): Payer: Medicaid Other

## 2023-07-30 ENCOUNTER — Emergency Department (HOSPITAL_COMMUNITY)
Admission: EM | Admit: 2023-07-30 | Discharge: 2023-07-30 | Disposition: A | Discharge status: Home / Self Care | Payer: Medicaid Other | Attending: Emergency Medicine | Admitting: Emergency Medicine

## 2023-07-30 ENCOUNTER — Other Ambulatory Visit: Payer: Self-pay

## 2023-07-30 DIAGNOSIS — T189XXA Foreign body of alimentary tract, part unspecified, initial encounter: Secondary | ICD-10-CM | POA: Diagnosis present

## 2023-07-30 DIAGNOSIS — I10 Essential (primary) hypertension: Secondary | ICD-10-CM | POA: Diagnosis not present

## 2023-07-30 DIAGNOSIS — W448XXA Other foreign body entering into or through a natural orifice, initial encounter: Secondary | ICD-10-CM | POA: Insufficient documentation

## 2023-07-30 NOTE — ED Triage Notes (Addendum)
Pt arrives to ED c/o swallowing fake tooth after eating crunchy food. Pt endorses abdominal pain now. Pt swallowed tooth 10 mins ago.

## 2023-07-30 NOTE — ED Provider Notes (Signed)
[place] EMERGENCY DEPARTMENT AT [facility] Provider Note   CSN: [id number] Arrival date & time: 07/30/23  2101     History  Chief Complaint  Patient presents with   Dental Injury    Gary Cox is a 37 y.o. male history of hypertension here presenting with swallowed his crown.  Patient had a crown placed by dentist many years ago.  He states that he ate something crunchy and accidentally swallowed the crown.  He states that he felt that it is in his stomach.  Denies any chest pain or cough or shortness of breath.   The history is provided by the patient.       Home Medications Prior to Admission medications   Medication Sig Start Date End Date Taking? Authorizing Provider  carvedilol (COREG) 25 MG tablet Take 1 tablet (25 mg total) by mouth 2 (two) times daily with a meal. 07/20/23   Rocky Morel, DO  chlorthalidone (HYGROTON) 25 MG tablet Take 1 tablet (25 mg total) by mouth daily. 07/24/23   Lovie Macadamia, MD  hydrALAZINE (APRESOLINE) 100 MG tablet Take 1 tablet (100 mg total) by mouth 3 (three) times daily. 07/23/23   Lovie Macadamia, MD  levETIRAcetam (KEPPRA) 250 MG tablet Take 1 tablet (250 mg total) by mouth 2 (two) times daily. 07/15/23 07/09/24  Windell Norfolk, MD  TARPEYO 4 MG CPDR Take 16 mg by mouth daily with breakfast. 04/08/23   [provider]  verapamil (CALAN-SR) 120 MG CR tablet Take 1 tablet (120 mg total) by mouth daily. 04/26/23 07/18/23  Sherryll Burger, Pratik D, DO      Allergies    Pork-derived products    Review of Systems   Review of Systems  HENT:  Positive for dental problem.   All other systems reviewed and are negative.   Physical Exam Updated Vital Signs BP (!) 166/118 (BP Location: Right Arm)   Pulse 63   Temp 98.5 F (36.9 C) (Oral)   Resp 16   Ht 5\' 7"  (1.702 m)   Wt 78 kg   SpO2 99%   BMI 26.94 kg/m  Physical Exam Vitals and nursing note reviewed.  Constitutional:      Appearance: Normal  appearance.  HENT:     Head: Normocephalic.     Nose: Nose normal.     Mouth/Throat:      Comments: Right frontal incisor with the crown missing.  No other missing teeth Cardiovascular:     Rate and Rhythm: Normal rate.     Pulses: Normal pulses.  Pulmonary:     Effort: Pulmonary effort is normal.  Abdominal:     General: Abdomen is flat.     Palpations: Abdomen is soft.  Musculoskeletal:        General: Normal range of motion.     Cervical back: Normal range of motion and neck supple.  Skin:    General: Skin is warm.     Capillary Refill: Capillary refill takes less than 2 seconds.  Neurological:     General: No focal deficit present.     Mental Status: He is alert.  Psychiatric:        Mood and Affect: Mood normal.     ED Results / Procedures / Treatments   Labs (all labs ordered are listed, but only abnormal results are displayed) Labs Reviewed - No data to display  EKG None  Radiology No results found.  Procedures Procedures    Medications Ordered in ED Medications -  No data to display  ED Course/ Medical Decision Making/ A&P                                 Medical Decision Making Gary Cox is a 37 y.o. male here presenting with swallowed a crown.  I think it is likely in his stomach.  Will get a chest x-ray and abdominal x-ray.  Patient has no cough and lungs are clear.  Patient request a hat so he can collect the crown in the stool as it was very expensive   10:33 PM X-ray confirmed that the tooth is in the stomach.  Stable for discharge.  Problems Addressed: Swallowed foreign body, initial encounter: acute illness or injury  Amount and/or Complexity of Data Reviewed Radiology: ordered.   Final Clinical Impression(s) / ED Diagnoses Final diagnoses:  None    Rx / DC Orders ED Discharge Orders     None         Charlynne Pander, MD 07/30/23 2234

## 2023-07-30 NOTE — Discharge Instructions (Signed)
Your x-ray showed that your tooth is in your stomach currently.  I am expecting you to pass it in several days.  Please follow-up with your dentist regarding getting another replacement  Avoid any crunchy foods for several days  Return to ER if you have severe pain or vomiting or fever or bleeding from the tooth

## 2023-08-14 ENCOUNTER — Other Ambulatory Visit: Payer: Self-pay | Admitting: Student

## 2023-08-18 ENCOUNTER — Other Ambulatory Visit: Payer: Self-pay | Admitting: Family Medicine

## 2023-08-18 ENCOUNTER — Other Ambulatory Visit (HOSPITAL_COMMUNITY): Payer: Self-pay

## 2023-08-18 MED ORDER — CHLORTHALIDONE 25 MG PO TABS
25.0000 mg | ORAL_TABLET | Freq: Every day | ORAL | 0 refills | Status: DC
  Filled 2023-08-18: qty 30, 30d supply, fill #0

## 2023-08-21 ENCOUNTER — Other Ambulatory Visit (HOSPITAL_COMMUNITY): Payer: Self-pay

## 2023-10-10 ENCOUNTER — Other Ambulatory Visit (HOSPITAL_COMMUNITY): Payer: Self-pay

## 2023-10-10 ENCOUNTER — Other Ambulatory Visit: Payer: Self-pay | Admitting: Family Medicine

## 2023-10-10 MED ORDER — CHLORTHALIDONE 25 MG PO TABS
25.0000 mg | ORAL_TABLET | Freq: Every day | ORAL | 0 refills | Status: AC
  Filled 2023-10-10: qty 30, 30d supply, fill #0

## 2023-10-16 ENCOUNTER — Telehealth: Payer: Self-pay

## 2023-10-16 NOTE — Patient Outreach (Signed)
Renal coordinator attempted to call patient on today regarding Safe Start referral. Successful outreach made today with patient. Safe Start telephone assessment scheduled for Friday, October 17, 2023 at 10:30 a.m. to determine if there are any VBCI resources needed.   Gary Cox/VBCI [facility] Assistant-Population Health [phone]

## 2023-10-17 ENCOUNTER — Telehealth: Payer: Self-pay

## 2023-10-17 DIAGNOSIS — N189 Chronic kidney disease, unspecified: Secondary | ICD-10-CM

## 2023-10-17 NOTE — Patient Outreach (Signed)
Renal coordinator attempted to call patient on today regarding Safe Start referral. Successful outreach made today with patient's spouse. She shared the need for more education around dialysis. Wife would like someone to help them through the process though unsure if or when patient will start. Referral to VBCI RN for on going care coordination as well as [employer] [facility]) to see if patient is eligible for home modifications to decrease falls.   Wife also shared she is currently having cell phone issues. All follows up for now should be on patients phone. Per spouse, please call in morning to assist with language barrier.  Baruch Gouty [place]/VBCI [facility] Assistant-Population Health [phone]

## 2023-10-22 ENCOUNTER — Other Ambulatory Visit (HOSPITAL_COMMUNITY): Payer: Self-pay

## 2023-10-23 ENCOUNTER — Other Ambulatory Visit: Payer: Self-pay | Admitting: *Deleted

## 2023-10-23 NOTE — Patient Outreach (Signed)
Care Coordination  10/23/2023  Gary Cox Apr 02, 1986 [id number]   RNCM completed telephone outreach with [name] while utilizing Arabic Interpreter. [name] has a PCP with [facility], Dr. Everlene Other and was last seen 07/14/23. RNCM advised patient to contact [employer] with updated PCP information and request case management services if desires. RNCM reviewed member benefits provided by [employer]. RNCM advised patient to contact member services [phone] to sign up for these benefits. Patient and spouse voiced understanding and appreciation.  Estanislado Emms RN, BSN [place]  [facility] RN Care Coordinator [phone]

## 2024-03-20 ENCOUNTER — Emergency Department (HOSPITAL_COMMUNITY)

## 2024-03-20 ENCOUNTER — Encounter (HOSPITAL_COMMUNITY): Payer: Self-pay | Admitting: Emergency Medicine

## 2024-03-20 ENCOUNTER — Emergency Department (HOSPITAL_COMMUNITY)
Admission: EM | Admit: 2024-03-20 | Discharge: 2024-03-21 | Disposition: A | Discharge status: Home / Self Care | Attending: Emergency Medicine | Admitting: Emergency Medicine

## 2024-03-20 DIAGNOSIS — Z79899 Other long term (current) drug therapy: Secondary | ICD-10-CM | POA: Diagnosis not present

## 2024-03-20 DIAGNOSIS — I120 Hypertensive chronic kidney disease with stage 5 chronic kidney disease or end stage renal disease: Secondary | ICD-10-CM | POA: Insufficient documentation

## 2024-03-20 DIAGNOSIS — F17200 Nicotine dependence, unspecified, uncomplicated: Secondary | ICD-10-CM | POA: Diagnosis not present

## 2024-03-20 DIAGNOSIS — G8918 Other acute postprocedural pain: Secondary | ICD-10-CM | POA: Diagnosis present

## 2024-03-20 DIAGNOSIS — N186 End stage renal disease: Secondary | ICD-10-CM | POA: Insufficient documentation

## 2024-03-20 LAB — CBC WITH DIFFERENTIAL/PLATELET
Abs Immature Granulocytes: 0.03 10*3/uL (ref 0.00–0.07)
Basophils Absolute: 0 10*3/uL (ref 0.0–0.1)
Basophils Relative: 1 %
Eosinophils Absolute: 0.1 10*3/uL (ref 0.0–0.5)
Eosinophils Relative: 1 %
HCT: 26.3 % — ABNORMAL LOW (ref 39.0–52.0)
Hemoglobin: 8.8 g/dL — ABNORMAL LOW (ref 13.0–17.0)
Immature Granulocytes: 0 %
Lymphocytes Relative: 28 %
Lymphs Abs: 2.4 10*3/uL (ref 0.7–4.0)
MCH: 28.2 pg (ref 26.0–34.0)
MCHC: 33.5 g/dL (ref 30.0–36.0)
MCV: 84.3 fL (ref 80.0–100.0)
Monocytes Absolute: 0.6 10*3/uL (ref 0.1–1.0)
Monocytes Relative: 8 %
Neutro Abs: 5.3 10*3/uL (ref 1.7–7.7)
Neutrophils Relative %: 62 %
Platelets: 243 10*3/uL (ref 150–400)
RBC: 3.12 MIL/uL — ABNORMAL LOW (ref 4.22–5.81)
RDW: 13.2 % (ref 11.5–15.5)
WBC: 8.4 10*3/uL (ref 4.0–10.5)
nRBC: 0 % (ref 0.0–0.2)

## 2024-03-20 LAB — I-STAT CHEM 8, ED
BUN: 68 mg/dL — ABNORMAL HIGH (ref 6–20)
Calcium, Ion: 1.05 mmol/L — ABNORMAL LOW (ref 1.15–1.40)
Chloride: 104 mmol/L (ref 98–111)
Creatinine, Ser: 8.7 mg/dL — ABNORMAL HIGH (ref 0.61–1.24)
Glucose, Bld: 121 mg/dL — ABNORMAL HIGH (ref 70–99)
HCT: 25 % — ABNORMAL LOW (ref 39.0–52.0)
Hemoglobin: 8.5 g/dL — ABNORMAL LOW (ref 13.0–17.0)
Potassium: 4 mmol/L (ref 3.5–5.1)
Sodium: 140 mmol/L (ref 135–145)
TCO2: 21 mmol/L — ABNORMAL LOW (ref 22–32)

## 2024-03-20 LAB — I-STAT CG4 LACTIC ACID, ED: Lactic Acid, Venous: 0.8 mmol/L (ref 0.5–1.9)

## 2024-03-20 MED ORDER — FENTANYL CITRATE PF 50 MCG/ML IJ SOSY
100.0000 ug | PREFILLED_SYRINGE | Freq: Once | INTRAMUSCULAR | Status: AC
  Administered 2024-03-20: 100 ug via INTRAVENOUS
  Filled 2024-03-20: qty 2

## 2024-03-20 MED ORDER — ONDANSETRON HCL 4 MG/2ML IJ SOLN
4.0000 mg | Freq: Once | INTRAMUSCULAR | Status: AC
  Administered 2024-03-20: 4 mg via INTRAVENOUS
  Filled 2024-03-20: qty 2

## 2024-03-20 NOTE — ED Provider Notes (Signed)
 [place] EMERGENCY DEPARTMENT AT [facility] Provider Note   CSN: [id number] Arrival date & time: 03/20/24  2122     History {Add pertinent medical, surgical, social history, OB history to HPI:1} No chief complaint on file.  History provided by the patient with the assistance of Arabic interpreter [phone]. Gary Cox is a 38 y.o. 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  He had peritoneal dialysis catheter placement and omentopexy yesterday at [facility].  He presents today with significant worsening of his pain generalized abdomen today with nausea and vomiting, NBNB emesis.  States he is urinating a normal amount with no hematuria, passing flatus but has not had a bowel movement since that time.  He is taking hydrocodone , methocarbamol and Zofran  at home for postoperative symptom management but unrelieved. In addition to the above listed history patient has history of tobacco dependence. Patient scheduled for first dialysis session on Monday.   HPI     Home Medications Prior to Admission medications   Medication Sig Start Date End Date Taking? Authorizing Provider  carvedilol  (COREG ) 25 MG tablet Take 1 tablet (25 mg total) by mouth 2 (two) times daily with a meal. 07/20/23   Cleven Dallas, DO  chlorthalidone  (HYGROTON ) 25 MG tablet Take 1 tablet (25 mg total) by mouth daily. 10/10/23   Paseda, Folashade R, FNP  hydrALAZINE  (APRESOLINE ) 100 MG tablet Take 1 tablet (100 mg total) by mouth 3 (three) times daily. 07/23/23   Sheree Dieter, MD  levETIRAcetam  (KEPPRA ) 250 MG tablet Take 1 tablet (250 mg total) by mouth 2 (two) times daily. 07/15/23 07/09/24  Camara, Amadou, MD  TARPEYO  4 MG CPDR Take 16 mg by mouth daily with breakfast. 04/08/23   [provider]  verapamil  (CALAN -SR) 120 MG CR tablet Take 1 tablet (120 mg total) by mouth daily. 04/26/23 07/18/23  Doreene Gammon D, DO      Allergies    Pork-derived products     Review of Systems   Review of Systems  Cardiovascular:  Positive for chest pain.  Gastrointestinal:  Positive for abdominal pain, nausea and vomiting.    Physical Exam Updated Vital Signs BP (!) 136/95 (BP Location: Right Arm)   Pulse 77   Temp 98.1 F (36.7 C) (Oral)   Resp 18   SpO2 99%  Physical Exam Vitals and nursing note reviewed.  Constitutional:      General: He is not in acute distress.    Appearance: He is not ill-appearing or toxic-appearing.  HENT:     Head: Normocephalic and atraumatic.     Mouth/Throat:     Mouth: Mucous membranes are moist.     Pharynx: No oropharyngeal exudate or posterior oropharyngeal erythema.  Eyes:     General:        Right eye: No discharge.        Left eye: No discharge.     Conjunctiva/sclera: Conjunctivae normal.  Cardiovascular:     Rate and Rhythm: Normal rate and regular rhythm.     Pulses: Normal pulses.     Heart sounds: Normal heart sounds. No murmur heard. Pulmonary:     Effort: Pulmonary effort is normal. No respiratory distress.     Breath sounds: Normal breath sounds. No wheezing or rales.  Abdominal:     General: Bowel sounds are normal. There is no distension.     Palpations: Abdomen is soft.     Tenderness: There is generalized abdominal tenderness. There is  guarding. There is no right CVA tenderness, left CVA tenderness or rebound.       Comments: Sterile surgical dressings in place, without evidence of discharge or blood soaking through bandages. Generally exquisitely tender on exam.   Musculoskeletal:        General: No deformity.     Cervical back: Neck supple.  Skin:    General: Skin is warm and dry.  Neurological:     Mental Status: He is alert. Mental status is at baseline.  Psychiatric:        Mood and Affect: Mood normal.     ED Results / Procedures / Treatments   Labs (all labs ordered are listed, but only abnormal results are displayed) Labs Reviewed - No data to  display  EKG None  Radiology No results found.  Procedures Procedures  {Document cardiac monitor, telemetry assessment procedure when appropriate:1}  Medications Ordered in ED Medications - No data to display  ED Course/ Medical Decision Making/ A&P   {   Click here for ABCD2, HEART and other calculatorsREFRESH Note before signing :1}                              Medical Decision Making 38 y/o male 1 day post op , presents with abdominal pain.   HTN on intake, VS otherwise normal. Cardiopulmonary exam unremarkable, abdominal exam is without distension but with generally exquisitely tender exam, no rebound. No CVAT.   Amount and/or Complexity of Data Reviewed Labs: ordered. Radiology: ordered.  Risk Prescription drug management.   ***  {Document critical care time when appropriate:1} {Document review of labs and clinical decision tools ie heart score, Chads2Vasc2 etc:1}  {Document your independent review of radiology images, and any outside records:1} {Document your discussion with family members, caretakers, and with consultants:1} {Document social determinants of health affecting pt's care:1} {Document your decision making why or why not admission, treatments were needed:1} Final Clinical Impression(s) / ED Diagnoses Final diagnoses:  None    Rx / DC Orders ED Discharge Orders     None

## 2024-03-20 NOTE — ED Notes (Signed)
 Patient transported to X-ray

## 2024-03-20 NOTE — ED Triage Notes (Signed)
 Pt here from home with c/o PERITONEAL DIALYSIS CATHETER pain that was placed yesterday along with some n/v

## 2024-03-21 LAB — COMPREHENSIVE METABOLIC PANEL WITH GFR
ALT: 8 U/L (ref 0–44)
AST: 19 U/L (ref 15–41)
Albumin: 3 g/dL — ABNORMAL LOW (ref 3.5–5.0)
Alkaline Phosphatase: 69 U/L (ref 38–126)
Anion gap: 16 — ABNORMAL HIGH (ref 5–15)
BUN: 69 mg/dL — ABNORMAL HIGH (ref 6–20)
CO2: 21 mmol/L — ABNORMAL LOW (ref 22–32)
Calcium: 8.2 mg/dL — ABNORMAL LOW (ref 8.9–10.3)
Chloride: 103 mmol/L (ref 98–111)
Creatinine, Ser: 8.32 mg/dL — ABNORMAL HIGH (ref 0.61–1.24)
GFR, Estimated: 8 mL/min — ABNORMAL LOW (ref >60)
Glucose, Bld: 120 mg/dL — ABNORMAL HIGH (ref 70–99)
Potassium: 4 mmol/L (ref 3.5–5.1)
Sodium: 140 mmol/L (ref 135–145)
Total Bilirubin: 0.9 mg/dL (ref 0.0–1.2)
Total Protein: 6.3 g/dL — ABNORMAL LOW (ref 6.5–8.1)

## 2024-03-21 MED ORDER — FENTANYL CITRATE PF 50 MCG/ML IJ SOSY
25.0000 ug | PREFILLED_SYRINGE | Freq: Once | INTRAMUSCULAR | Status: AC
  Administered 2024-03-21: 25 ug via INTRAVENOUS
  Filled 2024-03-21: qty 1

## 2024-03-21 NOTE — Discharge Instructions (Addendum)
 You were seen in the ER today for your abdominal pain.  Fortunately your blood work and CT scan are reassuring.  Your pain is likely related to the air that is retained in your abdomen following your surgery.  This is a normal postoperative symptom following laparoscopic surgery.  Please continue take the prescribed pain medication every 4 hours as needed, you may also take Tylenol .  Please continue your stool softener and follow-up closely with your surgeon as previously scheduled.  Return to the ER with any new severe symptoms.

## 2024-03-21 NOTE — ED Notes (Signed)
Patient waiting on ride.  

## 2024-03-23 ENCOUNTER — Telehealth: Payer: Self-pay

## 2024-03-23 NOTE — Transitions of Care (Post Inpatient/ED Visit) (Signed)
   03/23/2024  Name: Gary Cox MRN: [id number] DOB: [birth year]  Today's TOC FU Call Status: Today's TOC FU Call Status:: Unsuccessful Call (1st Attempt) Unsuccessful Call (1st Attempt) Date: 03/23/24  Attempted to reach the patient regarding the most recent Inpatient/ED visit.  Follow Up Plan: Additional outreach attempts will be made to reach the patient to complete the Transitions of Care (Post Inpatient/ED visit) call.   Signature  [employer], [place]

## 2024-03-24 ENCOUNTER — Telehealth: Payer: Self-pay

## 2024-03-24 NOTE — Transitions of Care (Post Inpatient/ED Visit) (Unsigned)
   03/24/2024  Name: Gary Cox MRN: [id number] DOB: [birth year]  Today's TOC FU Call Status:   Patient's Name and Date of Birth confirmed.  Transition Care Management Follow-up Telephone Call Discharge Facility: [facility]) Type of Discharge: Emergency Department How have you been since you were released from the hospital?: Better Any questions or concerns?: No  Items Reviewed: Did you receive and understand the discharge instructions provided?: Yes Medications obtained,verified, and reconciled?: Yes (Medications Reviewed) Any new allergies since your discharge?: No Dietary orders reviewed?: No Do you have support at home?: No  Medications Reviewed Today: Medications Reviewed Today     Reviewed by Angelita Bares, CMA (Certified Medical Assistant) on 03/24/24 at 1025  Med List Status: <None>   Medication Order Taking? Sig Documenting Provider Last Dose Status Informant  carvedilol  (COREG ) 25 MG tablet [id number] Yes Take 1 tablet (25 mg total) by mouth 2 (two) times daily with a meal. Cleven [place], DO Taking Active   chlorthalidone  (HYGROTON ) 25 MG tablet [id number] Yes Take 1 tablet (25 mg total) by mouth daily. Paseda, Folashade R, FNP Taking Active   hydrALAZINE  (APRESOLINE ) 100 MG tablet [id number] Yes Take 1 tablet (100 mg total) by mouth 3 (three) times daily. Sheree Dieter, MD Taking Active   levETIRAcetam  (KEPPRA ) 250 MG tablet [id number] Yes Take 1 tablet (250 mg total) by mouth 2 (two) times daily. Cassandra Cleveland, MD Taking Active Self  TARPEYO  4 MG CPDR [id number] Yes Take 16 mg by mouth daily with breakfast. [provider] Taking Active Self  verapamil  (CALAN -SR) 120 MG CR tablet [id number]  Take 1 tablet (120 mg total) by mouth daily. Mason Sole, Pratik D, DO  Expired 07/18/23 2359 Self            Home Care and Equipment/Supplies: Were Home Health Services Ordered?: No Any new equipment or medical supplies ordered?: No  Functional  Questionnaire: Do you need assistance with bathing/showering or dressing?: No Do you need assistance with meal preparation?: No Do you need assistance with eating?: No Do you have difficulty maintaining continence: No Do you need assistance with getting out of bed/getting out of a chair/moving?: No Do you have difficulty managing or taking your medications?: No  Follow up appointments reviewed: PCP Follow-up appointment confirmed?: Yes Date of PCP follow-up appointment?: 04/05/24 Follow-up Provider: [facility] Follow-up appointment confirmed?: NA Do you need transportation to your follow-up appointment?: No Do you understand care options if your condition(s) worsen?: Yes-patient verbalized understanding    SIGNATURE Chastin Garlitz, RMA

## 2024-04-05 ENCOUNTER — Inpatient Hospital Stay: Payer: Self-pay | Admitting: Nurse Practitioner
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
## 2024-05-26 NOTE — Telephone Encounter (Signed)
 MSW spoke with Pts wife regarding financial assistance availability. Pts wife reports Pt recently approved for Medicare. MSW discussed [employer] fund and provided website link via email. MSW provided 211 link. MSW discussed she would apply for [employer] on Pts behalf. Pts wife discussed financial challenges leading up to starting dialysis. MSW provided contact information and advised she would meet them in post transplant clinic.

## 2024-05-27 ENCOUNTER — Encounter: Payer: Self-pay | Admitting: Neurology

## 2024-05-28 ENCOUNTER — Telehealth: Payer: Self-pay

## 2024-05-28 DIAGNOSIS — Z940 Kidney transplant status: Secondary | ICD-10-CM

## 2024-05-28 NOTE — Patient Instructions (Signed)
 Visit Information  Thank you for taking time to visit with me today. Please don't hesitate to contact me if I can be of assistance to you before our next scheduled telephone appointment.  Our next appointment is by telephone on 06/01/24 at 10 am  Following is a copy of your care plan:   Goals Addressed             This Visit's Progress    VBCI Transitions of Care (TOC) Care Plan       Problems:  Recent Hospitalization for treatment of kidney transplant Equipment/DME barrier needs shower chair and Knowledge Deficit Related to kidney transplant and how to access transplant coordinator for concerns/ needs.   Goal:  Over the next 30 days, the patient will not experience hospital readmission  Interventions:  Transitions of Care: Durable Medical Equipment (DME) discussed need for shower chair.  Will follow up with transplant coordinator regarding DME need.  Doctor Visits  - discussed the importance of doctor visits Post discharge activity limitations prescribed by provider reviewed Post-op wound/incision care reviewed with patient/caregiver Reviewed Signs and symptoms of infection Call made to transplant plant center/ transplant coordinator Powell Agent to discuss patients needs/ concerns. Message left requesting return call Confirmed patient has contact phone number for the transplant center and PAL ( Physician access line).  Provided patient name and contact phone number for transplant coordinator Powell Agent. [phone]. If unable to contact call front desk number [phone] and ask for coordinator in her office. Office is open from 8 am to 5pm.  Assessed for new / ongoing symptoms Assessed for home support system Active listening and support Medications reviewed and compliance discussed Advised patient and/ or wife to call and report concerns regarding not having 4 of his medications at discharge to the transplant center and discuss at scheduled transplant appointment on  05/31/24.  Advised to measure vital signs, intake and output, weight.  Discussed wound care and reviewed signs/ symptoms of infection. Advised to call transplant team for fever >101.5 Wife and patient advised to read discharge instruction packet thoroughly for post surgical transplant management.  Advised to Drink around 2-3 liters of water a day and use the bathroom frequently, every 1.5-2 hours with frequent stops with car rides per your discharge instructions.   Patient Self Care Activities:  Attend all scheduled provider appointments Call pharmacy for medication refills 3-7 days in advance of running out of medications Call provider office for new concerns or questions  Take medications as prescribed   Read your discharge instruction packet thoroughly for post surgical transplant management Notify the transplant team for signs of wound infection: fever>101.5, any purulent or increased drainage from the incision, redness extending from the incision, uncontrolled pain or any other questions or concerns.  Monitor your vital signs, your intake and output, and weight call and report concerns regarding not having 4 of his medications at discharge to the transplant center and discuss at scheduled transplant appointment on 05/31/24.  Contact Coordinator Powell Agent, RN, phone number [phone]. If unable to contact call front desk number [phone] and ask for coordinator in her office. Office is open from 8 am to 5pm.   2. PAL line [phone] on weekends, holidays, and after office hours.  Drink around 2-3 liters of water a day and use the bathroom frequently, every 1.5-2 hours with frequent stops with car rides   Plan:  Telephone follow up appointment with care management team member scheduled for:  06/01/24 at 10 am  Patient verbalizes understanding of instructions and care plan provided today and agrees to view in MyChart. Active MyChart status and patient understanding of how to  access instructions and care plan via MyChart confirmed with patient.     The patient has been provided with contact information for the care management team and has been advised to call with any health related questions or concerns.   Please call the care guide team at [phone] if you need to cancel or reschedule your appointment.   Please call the Suicide and Crisis Lifeline: 988 call 1-800-273-TALK (toll free, 24 hour hotline) if you are experiencing a Mental Health or Behavioral Health Crisis or need someone to talk to.  Arvin Seip RN, BSN, CCM [employer], Population Health Case Manager Phone: [phone]

## 2024-05-28 NOTE — Transitions of Care (Post Inpatient/ED Visit) (Signed)
 05/28/2024  Name: Gary Cox MRN: [id number] DOB: 1986/10/21  Today's TOC FU Call Status: Today's TOC FU Call Status:: Successful TOC FU Call Completed TOC FU Call Complete Date: 05/28/24 Patient's Name and Date of Birth confirmed.  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 Patient states his chest hurts when he is eating and swallowing.  He states he did not have this issues prior to surgery.) Any questions or concerns?: Yes Patient Questions/Concerns:: Patient states he was given a bag with medications and discharge instruction packet upon leaving the hospital.  He states when he got home he realized that 4 of his medications were missing.  Wife states one of the medications was OTC aspirin that she has since pick up from the drug store and the other medications were his carvediolol, keppra , and hydralazine .  Wife states patient still had these 3 medications at home from when he was taking dialysis.  Wife states patient has all of his prescribed medications at this time.  Wife and patient states he received his walker but didn't received the recommended shower chair. Wife also states patient will need transportation assistance to get to his appointments because she has 4 children and it will be difficult for her to try and make all of patients scheduled appointments. Patient Questions/Concerns Addressed: Other: (Discussed patient and wife concerns. This RN case manager contacted the transplant center to identify patients transplant  coordinator.  Left message for transplant coordinator, Powell Agent regarding patients concerns/ needs. Requested return call.)  Items Reviewed: Did you receive and understand the discharge instructions provided?: Yes Medications obtained,verified, and reconciled?: Yes (Medications Reviewed) Any new allergies since your discharge?: No Dietary orders reviewed?: Yes Type of Diet Ordered:: Advised patient/ wife to review patients dietary orders in discharge instruction packet. Do you have support at home?: Yes People in Home [RPT]: spouse Name of Support/Comfort Primary Source: Gary Cox- spouse  Medications Reviewed Today: Medications Reviewed Today     Reviewed by Quinlan Mcfall E, RN (Registered Nurse) on 05/28/24 at 1128  Med List Status: <None>   Medication Order Taking? Sig Documenting Provider Last Dose Status Informant  acetaminophen  (TYLENOL ) 500 MG tablet [id number] Yes Take 1,000 mg by mouth every 6 (six) hours as needed for mild pain (pain score 1-3). [provider]  Active   aspirin EC 81 MG tablet [id number] Yes Take 81 mg by mouth daily. Swallow whole. [provider]  Active   carvedilol  (COREG ) 25 MG tablet [id number] Yes Take 1 tablet (25 mg total) by mouth 2 (two) times daily with a meal. Jolaine Pac, DO  Active   chlorthalidone  (HYGROTON ) 25 MG tablet [id number]  Take 1 tablet (25 mg total) by mouth daily.  Patient not taking: Reported on 05/28/2024   Paseda, Folashade R, FNP  Active   fluconazole (DIFLUCAN) 50 MG tablet [id number] Yes Take 50 mg by mouth daily. [provider]  Active   hydrALAZINE  (APRESOLINE ) 100 MG tablet [id number] Yes Take 1 tablet (100 mg total) by mouth 3 (three) times daily. Gabino Boga, MD  Active   levETIRAcetam  (KEPPRA ) 250 MG tablet [id number] Yes  Take 1 tablet (250 mg total) by mouth 2 (two) times daily. Gregg Lek, MD  Active Self  omeprazole  (PRILOSEC) 40 MG capsule [id number] Yes Take 40 mg by mouth 2  (two) times daily. [provider]  Active   oxycodone  (OXY-IR) 5 MG capsule [id number] Yes Take 5 mg by mouth every 4 (four) hours as needed. [provider]  Active   polyethylene glycol (MIRALAX  / GLYCOLAX ) 17 g packet [id number] Yes Take 17 g by mouth daily. [provider]  Active   senna-docusate (SENOKOT-S) 8.6-50 MG tablet [id number] Yes Take 2 tablets by mouth at bedtime as needed for mild constipation. [provider]  Active   TARPEYO  4 MG CPDR [id number]  Take 16 mg by mouth daily with breakfast.  Patient not taking: Reported on 05/28/2024   [provider]  Active Self  valGANciclovir (VALCYTE) 450 MG tablet [id number] Yes Take 450 mg by mouth daily. [provider]  Active   verapamil  (CALAN -SR) 120 MG CR tablet [id number]  Take 1 tablet (120 mg total) by mouth daily.  Patient not taking: Reported on 05/28/2024   Maree Bracken D, DO  Expired 07/18/23 2359 Self            Home Care and Equipment/Supplies: Were Home Health Services Ordered?: No Any new equipment or medical supplies ordered?: Yes Name of Medical supply agency?: [employer] Were you able to get the equipment/medical supplies?: No (patient states he received his walker but did not receive a shower chair) Do you have any questions related to the use of the equipment/supplies?: No  Functional Questionnaire: Do you need assistance with bathing/showering or dressing?: Yes Do you need assistance with meal preparation?: Yes Do you need assistance with eating?: No Do you have difficulty maintaining continence: No Do you need assistance with getting out of bed/getting out of a chair/moving?: Yes (wife states she has to occasionally help patient up from the chair.) Do you have difficulty managing or taking your medications?: Yes (wife states she is assisting patient with his medications.)  Follow up appointments reviewed: PCP Follow-up appointment confirmed?: No  (wife states she will call and schedule patient a follow up appointment with his primary care provider.) Specialist Hospital Follow-up appointment confirmed?: Yes Date of Specialist follow-up appointment?: 05/31/24 Follow-Up Specialty Provider:: [facility] organ transplant center Do you need transportation to your follow-up appointment?: Yes Transportation Need Intervention Addressed By:: Other: (message left with transplant coordinator Powell Agent regarding patients need for transportation assistance. Wife states she will take patient to his scheduled appointment with the transplant center on 05/31/24.) Do you understand care options if your condition(s) worsen?: Yes-patient verbalized understanding  SDOH Interventions Today    Flowsheet Row Most Recent Value  SDOH Interventions   Food Insecurity Interventions Intervention Not Indicated  Housing Interventions Intervention Not Indicated  Transportation Interventions Other (Comment)  [Wife is requesting transportation assistance for patient because she will not be able to take him to all of his appointments due to having 4 children.  Patient referred to transplant coordinator for transportation assistance and resource care guide]  Utilities Interventions Intervention Not Indicated    Goals Addressed             This Visit's Progress    VBCI Transitions of Care (TOC) Care Plan       Problems:  Recent Hospitalization for treatment of kidney transplant Equipment/DME barrier needs shower chair and Knowledge Deficit Related to kidney transplant and how to access transplant coordinator for  concerns/ needs.   Goal:  Over the next 30 days, the patient will not experience hospital readmission  Interventions:  Transitions of Care: Durable Medical Equipment (DME) discussed need for shower chair.  Will follow up with transplant coordinator regarding DME need.  Doctor Visits  - discussed the importance of doctor visits Post discharge  activity limitations prescribed by provider reviewed Post-op wound/incision care reviewed with patient/caregiver Reviewed Signs and symptoms of infection Call made to transplant plant center/ transplant coordinator Powell Agent to discuss patients needs/ concerns. Message left requesting return call Confirmed patient has contact phone number for the transplant center and PAL ( Physician access line).  Provided patient name and contact phone number for transplant coordinator Powell Agent. [phone]. If unable to contact call front desk number [phone] and ask for coordinator in her office. Office is open from 8 am to 5pm.  Assessed for new / ongoing symptoms Assessed for home support system Active listening and support Medications reviewed and compliance discussed Advised patient and/ or wife to call and report concerns regarding not having 4 of his medications at discharge to the transplant center and discuss at scheduled transplant appointment on 05/31/24.  Advised to measure vital signs, intake and output, weight.  Discussed wound care and reviewed signs/ symptoms of infection. Advised to call transplant team for fever >101.5 Wife and patient advised to read discharge instruction packet thoroughly for post surgical transplant management.  Advised to Drink around 2-3 liters of water a day and use the bathroom frequently, every 1.5-2 hours with frequent stops with car rides per your discharge instructions.  Referred to community resource guide regarding transportation assistance.   Patient Self Care Activities:  Attend all scheduled provider appointments Call pharmacy for medication refills 3-7 days in advance of running out of medications Call provider office for new concerns or questions  Take medications as prescribed   Read your discharge instruction packet thoroughly for post surgical transplant management Notify the transplant team for signs of wound infection: fever>101.5, any  purulent or increased drainage from the incision, redness extending from the incision, uncontrolled pain or any other questions or concerns.  Monitor your vital signs, your intake and output, and weight call and report concerns regarding not having 4 of his medications at discharge to the transplant center and discuss at scheduled transplant appointment on 05/31/24.  Contact Coordinator Powell Agent, RN, phone number [phone]. If unable to contact call front desk number [phone] and ask for coordinator in her office. Office is open from 8 am to 5pm.   2. PAL line [phone] on weekends, holidays, and after office hours.  Drink around 2-3 liters of water a day and use the bathroom frequently, every 1.5-2 hours with frequent stops with car rides   Plan:  Telephone follow up appointment with care management team member scheduled for:  06/01/24 at 10 am        Discussed and offered 30 day TOC program.  Patient verbally agreed. The patient has been provided with contact information for the care management team and has been advised to call with any health -related questions or concerns.  The patient verbalized understanding with current plan of care.  The patient is directed to their insurance card regarding availability of benefits coverage.    Arvin Seip RN, BSN, CCM [employer], Population Health Case Manager Phone: [phone]

## 2024-06-01 ENCOUNTER — Other Ambulatory Visit: Payer: Self-pay

## 2024-06-01 ENCOUNTER — Telehealth: Payer: Self-pay

## 2024-06-01 NOTE — Progress Notes (Signed)
   Telephone encounter was:  Unsuccessful.  06/01/2024 Name: Gary Cox MRN: [id number] DOB: 28-Aug-1986  Unsuccessful outbound call made today to assist with:  Transportation Needs   Outreach Attempt:  1st Attempt  No answer and unable to leave a message    Jon Colt [facility]  [facility] Guide, Phone: [phone] Fax: [phone] Website: [email or web address]

## 2024-06-01 NOTE — Transitions of Care (Post Inpatient/ED Visit) (Signed)
  Transition of Care week 2  Visit Note  06/01/2024  Name: Gary Cox MRN: [id number]          DOB: November 03, 1986  Situation: Successful telephone outreach to patient.  Patient states he was readmitted to the hospital on yesterday due to his weight being down, potassium and blood sugars being elevated.  Patient will be closed to 30 DAY TOC program at this time.  Patient advised of this and explained that [facility] program closed due to him being readmitted to the hospital.  Patient verbalized understanding.   Follow Up Plan:   Closing From:  Transitions of Care Program due to hospital readmission  Arvin Seip RN, BSN, CCM [facility], Population Health Case Manager Phone: [phone]

## 2024-06-02 ENCOUNTER — Telehealth: Payer: Self-pay

## 2024-06-02 NOTE — Transitions of Care (Post Inpatient/ED Visit) (Signed)
   06/02/2024 Late entry 05/28/24  Name: Gary Cox MRN: [id number] DOB: [birth year]  Today's TOC FU Call Status: Today's TOC FU Call Status:: Unsuccessful Call (1st Attempt) Unsuccessful Call (1st Attempt) Date: 05/28/24  Attempted to reach the patient regarding the most recent Inpatient/ED visit.  Follow Up Plan: Additional outreach attempts will be made to reach the patient to complete the Transitions of Care (Post Inpatient/ED visit) call.   Arvin Seip RN, BSN, CCM [employer], Population Health Case Manager Phone: [phone]

## 2024-06-02 NOTE — Progress Notes (Signed)
   Telephone encounter was:  Successful.  Complex Care Management Note Care Guide Note  06/02/2024 Name: Gary Cox MRN: [id number] DOB: 1986-03-23  Gary Cox [name] is a 38 y.o. year old male who is a primary care patient of Tilford Bertram HERO, FNP . The community resource team was consulted for assistance with Transportation Needs   SDOH screenings and interventions completed:  No        Care guide performed the following interventions: Patient provided with information about care guide support team and interviewed to confirm resource needs.Pt stated he is in the hospital and requested I call back in a few days   Follow Up Plan:  Care guide will follow up with patient by phone over the next week  Encounter Outcome:  Patient Request to Call Back    Jon Colt [facility]  [facility] Guide, Phone: [phone] Fax: [phone] Website: [email or web address]

## 2024-06-03 ENCOUNTER — Telehealth: Payer: Self-pay

## 2024-06-03 NOTE — Transitions of Care (Post Inpatient/ED Visit) (Unsigned)
 06/03/2024  Name: Gary Cox MRN: [id number] DOB: [birth year]  Today's TOC FU Call Status: Today's TOC FU Call Status:: Successful TOC FU Call Completed TOC FU Call Complete Date: 06/03/24 Patient's Name and Date of Birth confirmed.  Transition Care Management Follow-up Telephone Call Date of Discharge: 06/03/24 Discharge Facility: Other [facility]) Name of Other (Non[facility]) Discharge Facility: [facility] Type of Discharge: Inpatient Admission Primary Inpatient Discharge Diagnosis:: Dehydration How have you been since you were released from the hospital?: Better Any questions or concerns?: No Patient Questions/Concerns Addressed:  (n/a)  Items Reviewed: Did you receive and understand the discharge instructions provided?: Yes Medications obtained,verified, and reconciled?: Yes (Medications Reviewed) Any new allergies since your discharge?: No Type of Diet Ordered:: Per patient he met with the dietician prior to discharge to review new diet changes and expectations after transplant and was given a dietary handout given Do you have support at home?: Yes People in Home [RPT]: spouse Name of Support/Comfort Primary Source: Lari Lee - spouse  Medications Reviewed Today: Medications Reviewed Today     Reviewed by Brindy Higginbotham E, RN (Registered Nurse) on 06/03/24 at 1735  Med List Status: <None>   Medication Order Taking? Sig Documenting Provider Last Dose Status Informant  acetaminophen  (TYLENOL ) 500 MG tablet [id number] Yes Take 1,000 mg by mouth every 6 (six) hours as needed for mild pain (pain score 1-3). [provider]  Active   aspirin EC 81 MG tablet [id number] Yes Take 81 mg by mouth daily. Swallow whole. [provider]  Active   carvedilol  (COREG ) 25 MG tablet [id number] Yes Take 1 tablet (25 mg total) by mouth 2 (two) times daily with a meal. Jolaine Pac, DO  Active   chlorthalidone  (HYGROTON ) 25 MG tablet [id number]   Take 1 tablet (25 mg total) by mouth daily.  Patient not taking: Reported on 05/28/2024   Paseda, Folashade R, FNP  Active   fluconazole (DIFLUCAN) 50 MG tablet [id number] Yes Take 50 mg by mouth daily. [provider]  Active   hydrALAZINE  (APRESOLINE ) 100 MG tablet [id number] Yes Take 1 tablet (100 mg total) by mouth 3 (three) times daily. Gabino Boga, MD  Active   levETIRAcetam  (KEPPRA ) 250 MG tablet [id number] Yes Take 1 tablet (250 mg total) by mouth 2 (two) times daily. Gregg Lek, MD  Active Self  omeprazole  (PRILOSEC) 40 MG capsule [id number] Yes Take 40 mg by mouth 2 (two) times daily. [provider]  Active   oxycodone  (OXY-IR) 5 MG capsule [id number] Yes Take 5 mg by mouth every 4 (four) hours as needed. [provider]  Active   polyethylene glycol (MIRALAX  / GLYCOLAX ) 17 g packet [id number] Yes Take 17 g by mouth daily. [provider]  Active   senna-docusate (SENOKOT-S) 8.6-50 MG tablet [id number] Yes Take 2 tablets by mouth at bedtime as needed for mild constipation. [provider]  Active   TARPEYO  4 MG CPDR [id number]  Take 16 mg by mouth daily with breakfast.  Patient not taking: Reported on 05/28/2024   [provider]  Active Self  valGANciclovir (VALCYTE) 450 MG tablet [id number] Yes Take 450 mg by mouth daily. [provider]  Active   verapamil  (CALAN -SR) 120 MG CR tablet [id number]  Take 1 tablet (120 mg total) by mouth daily.  Patient not taking: Reported on 05/28/2024   Maree Bracken D, DO  Expired 07/18/23 2359 Self            Home Care  and Equipment/Supplies: Were Home Health Services Ordered?: No Any new equipment or medical supplies ordered?: No Were you able to get the equipment/medical supplies?:  (n/a) Do you have any questions related to the use of the equipment/supplies?: No  Functional Questionnaire: Do you need assistance with bathing/showering or dressing?: No Do you need assistance  with meal preparation?: Yes Do you need assistance with eating?: No Do you have difficulty maintaining continence: No Do you need assistance with getting out of bed/getting out of a chair/moving?: No Do you have difficulty managing or taking your medications?: Yes  Follow up appointments reviewed: PCP Follow-up appointment confirmed?: No (Patient states he or his wife will call and schedule a follow up visit with his primary care provider.) Specialist Hospital Follow-up appointment confirmed?: Yes Date of Specialist follow-up appointment?: 06/04/24 Follow-Up Specialty Provider:: [facility] -Abdominal Organ Transplant Do you need transportation to your follow-up appointment?: Yes Transportation Need Intervention Addressed By:: Other: (patient has been referred to the community resource guide) Do you understand care options if your condition(s) worsen?: Yes-patient verbalized understanding  SDOH Interventions Today    Flowsheet Row Most Recent Value  SDOH Interventions   Food Insecurity Interventions Intervention Not Indicated  Housing Interventions Intervention Not Indicated  Utilities Interventions Intervention Not Indicated    Goals Addressed             This Visit's Progress    VBCI Transitions of Care (TOC) Care Plan       Problems:  Recent Hospitalization for treatment of kidney transplant Equipment/DME barrier needs shower chair and Knowledge Deficit Related to kidney transplant and how to access transplant coordinator for concerns/ needs.   Goal:  Over the next 30 days, the patient will not experience hospital readmission  Interventions:  Transitions of Care: Durable Medical Equipment (DME) discussed need for shower chair.  Will follow up with transplant coordinator regarding DME need.  Doctor Visits  - discussed the importance of doctor visits Post discharge activity limitations prescribed by provider reviewed Post-op wound/incision care  reviewed with patient/caregiver Reviewed Signs and symptoms of infection Confirmed patient has contact phone number for the transplant center and PAL ( Physician access line).  Provided patient name and contact phone number for transplant coordinator Powell Agent. [phone]. If unable to contact call front desk number [phone] and ask for coordinator in her office. Office is open from 8 am to 5pm.  Assessed for new / ongoing symptoms Assessed for home support system Medications reviewed and compliance discussed Advised to call primary care provider office to schedule hospital follow up  Advised to measure vital signs, intake and output, weight.  Discussed wound care and reviewed signs/ symptoms of infection. Advised to call transplant team for fever >101.5 Advised to read discharge instruction packet thoroughly for post surgical transplant management and recent discharge instructions from 06/02/24. Advised to Drink around 2-3 liters of water a day and use the bathroom frequently, every 1.5-2 hours with frequent stops with car rides per your discharge instructions.  Referred to community resource guide regarding transportation assistance.  Reviewed upcoming provider visits Confirmed patient has transportation to scheduled transplant center visit on 06/04/24 and 06/07/24.  Patient Self Care Activities:  Attend all scheduled provider appointments Call pharmacy for medication refills 3-7 days in advance of running out of medications Call provider office for new concerns or questions  Take medications as prescribed   Read your discharge instruction packet thoroughly for post surgical transplant management Notify the transplant team for  signs of wound infection: fever>101.5, any purulent or increased drainage from the incision, redness extending from the incision, uncontrolled pain or any other questions or concerns.  Monitor your vital signs, your intake and output, and weight Contact  Coordinator Powell Agent, RN, phone number [phone]. If unable to contact call front desk number [phone] and ask for coordinator in her office. Office is open from 8 am to 5pm.  Call PAL line [phone] on weekends, holidays, and after office hours for concerns or questions.  Drink around 2-3 liters of water a day and use the bathroom frequently, every 1.5-2 hours with frequent stops with car rides   Plan:  Telephone follow up appointment with care management team member scheduled for:  06/11/24 at 2 pm        Discussed and offered 30 day TOC program.  Patient verbally agreed.  The patient has been provided with contact information for the care management team and has been advised to call with any health -related questions or concerns.  The patient verbalized understanding with current plan of care.  The patient is directed to their insurance card regarding availability of benefits coverage.    Arvin Seip RN, BSN, CCM [employer], Population Health Case Manager Phone: [phone]

## 2024-06-03 NOTE — Patient Instructions (Signed)
 Visit Information  Thank you for taking time to visit with me today. Please don't hesitate to contact me if I can be of assistance to you before our next scheduled telephone appointment.  Our next appointment is by telephone on 06/11/24 at 2 pm  Following is a copy of your care plan:   Goals Addressed             This Visit's Progress    VBCI Transitions of Care (TOC) Care Plan       Problems:  Recent Hospitalization for treatment of kidney transplant Equipment/DME barrier needs shower chair and Knowledge Deficit Related to kidney transplant and how to access transplant coordinator for concerns/ needs.   Goal:  Over the next 30 days, the patient will not experience hospital readmission  Interventions:  Transitions of Care: Durable Medical Equipment (DME) discussed need for shower chair.  Will follow up with transplant coordinator regarding DME need.  Doctor Visits  - discussed the importance of doctor visits Post discharge activity limitations prescribed by provider reviewed Post-op wound/incision care reviewed with patient/caregiver Reviewed Signs and symptoms of infection Confirmed patient has contact phone number for the transplant center and PAL ( Physician access line).  Provided patient name and contact phone number for transplant coordinator Powell Agent. [phone]. If unable to contact call front desk number [phone] and ask for coordinator in her office. Office is open from 8 am to 5pm.  Assessed for new / ongoing symptoms Assessed for home support system Medications reviewed and compliance discussed Advised to call primary care provider office to schedule hospital follow up  Advised to measure vital signs, intake and output, weight.  Discussed wound care and reviewed signs/ symptoms of infection. Advised to call transplant team for fever >101.5 Advised to read discharge instruction packet thoroughly for post surgical transplant management and recent discharge  instructions from 06/02/24. Advised to Drink around 2-3 liters of water a day and use the bathroom frequently, every 1.5-2 hours with frequent stops with car rides per your discharge instructions.  Referred to community resource guide regarding transportation assistance.  Reviewed upcoming provider visits Confirmed patient has transportation to scheduled transplant center visit on 06/04/24 and 06/07/24.  Patient Self Care Activities:  Attend all scheduled provider appointments Call pharmacy for medication refills 3-7 days in advance of running out of medications Call provider office for new concerns or questions  Take medications as prescribed   Read your discharge instruction packet thoroughly for post surgical transplant management Notify the transplant team for signs of wound infection: fever>101.5, any purulent or increased drainage from the incision, redness extending from the incision, uncontrolled pain or any other questions or concerns.  Monitor your vital signs, your intake and output, and weight Contact Coordinator Powell Agent, RN, phone number [phone]. If unable to contact call front desk number [phone] and ask for coordinator in her office. Office is open from 8 am to 5pm.  Call PAL line [phone] on weekends, holidays, and after office hours for concerns or questions.  Drink around 2-3 liters of water a day and use the bathroom frequently, every 1.5-2 hours with frequent stops with car rides   Plan:  Telephone follow up appointment with care management team member scheduled for:  06/11/24 at 2 pm        Patient verbalizes understanding of instructions and care plan provided today and agrees to view in MyChart. Active MyChart status and patient understanding of how to access instructions and care plan via MyChart confirmed  with patient.     The patient has been provided with contact information for the care management team and has been advised to call with any health  related questions or concerns.   Please call the care guide team at [phone] if you need to cancel or reschedule your appointment.   Please call the Suicide and Crisis Lifeline: 988 call 1-800-273-TALK (toll free, 24 hour hotline) if you are experiencing a Mental Health or Behavioral Health Crisis or need someone to talk to.  Arvin Seip RN, BSN, CCM [employer], Population Health Case Manager Phone: [phone]

## 2024-06-09 ENCOUNTER — Telehealth: Payer: Self-pay

## 2024-06-09 NOTE — Progress Notes (Signed)
   Telephone encounter was:  Unsuccessful.  06/09/2024 Name: Gary Cox MRN: [id number] DOB: 19-Jun-1986  Unsuccessful outbound call made today to assist with:  Transportation Needs   Outreach Attempt:  3rd Attempt.  Referral closed unable to contact patient.    Jon Colt [facility]  [facility] Guide, Phone: [phone] Fax: [phone] Website: [email or web address]

## 2024-06-10 NOTE — ED Notes (Signed)
 The patient is with a staff member at this time.    Andres Gunner [facility] 06/10/24 1305

## 2024-06-10 NOTE — Progress Notes (Signed)
 Serum Cr slightly higher; gave him IV fluid in the clinic. Tacrolimus level within acceptable range.

## 2024-06-10 NOTE — Progress Notes (Signed)
 TXP prograf 3/2 Patient reports last dose taken last night at 8 pm  Intake approximately 64 ounces of fluid intake daily UOP: WNL  S/S of urinary tract infection: no symptoms reported Medication review completed: no changes in med list  Concerns today: flank pain, numbness in fingers.  Has an appointment at 10 in Ultrasound  Shona DELENA Hoit, CMA

## 2024-06-10 NOTE — ED Triage Notes (Signed)
 Patient in with c/o mid lower back pain and tingling in BUE x3 days. Denies any tingling in his lower extremities. Patient had a kidney transplant approx. Two weeks ago. Patient VSS, GCS 15
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
## 2024-06-10 NOTE — Progress Notes (Signed)
 Order for staple removal given. Staples removed using our staple removal device. Incision is then cleaned with chlor-a-prep solution and allowed to dry. Benzoin is applied to both sides of the incision. Steri-strips are then placed over entire incision. Joen Monty Fireman, RN

## 2024-06-11 ENCOUNTER — Other Ambulatory Visit: Payer: Self-pay

## 2024-06-11 NOTE — Transitions of Care (Post Inpatient/ED Visit) (Addendum)
 Transition of Care week 4  Visit Note  8/12/2025H  Name: Gary Cox MRN: [id number]          DOB: Feb 16, 1986  Situation: Patient enrolled in [facility] 30-day program. Visit completed with patient by telephone.   Background: Hospital admission from 05/20/24 to 05/27/24 for renal transplant  Initial Transition Care Management Follow-up Telephone Call    Past Medical History:  Diagnosis Date   IgA nephropathy 08/26/2017   Renal disorder    UTI (urinary tract infection)     Assessment: Patient Reported Symptoms: Cognitive Cognitive Status: Normal speech and language skills, No symptoms reported, Alert and oriented to person, place, and time, Insightful and able to interpret abstract concepts      Neurological Neurological Review of Symptoms: No symptoms reported    HEENT HEENT Symptoms Reported: No symptoms reported      Cardiovascular Cardiovascular Symptoms Reported: No symptoms reported    Respiratory Respiratory Symptoms Reported: No symptoms reported    Endocrine Endocrine Symptoms Reported: No symptoms reported    Gastrointestinal Gastrointestinal Symptoms Reported: No symptoms reported      Genitourinary Genitourinary Symptoms Reported: No symptoms reported Additional Genitourinary Details: patient status post kidney transplant 05/20/24.  Patient states he is doing much better.  He reports having follow up visit with surgeon on yesterday.  He states his staples were removed and steri strips applied. Patient states his incision is healing and denies any signs of infection.  Patient states he is keeping incision clean and dry as recommended by provider. Patient reports he is urinating without difficulty    Integumentary Integumentary Symptoms Reported: Wound Additional Integumentary Details: Patient reports staples were removed from incision on yesterday and steri strips were applied. Denies signs of infection. Reports pain level today is 0.     Musculoskeletal Musculoskelatal Symptoms Reviewed: Back pain Additional Musculoskeletal Details: Patient reports having severe back pain on yesterday. He states he went to the ED to be evaluated. Reports xrays were negative.  Denies back pain today is 0.        Psychosocial Psychosocial Symptoms Reported: No symptoms reported         There were no vitals filed for this visit.  Medications Reviewed Today     Reviewed by Semira Stoltzfus E, RN (Registered Nurse) on 06/15/24 at 2214  Med List Status: <None>   Medication Order Taking? Sig Documenting Provider Last Dose Status Informant  acetaminophen  (TYLENOL ) 500 MG tablet [id number]  Take 1,000 mg by mouth every 6 (six) hours as needed for mild pain (pain score 1-3). [provider]  Active   aspirin EC 81 MG tablet [id number]  Take 81 mg by mouth daily. Swallow whole. [provider]  Active   carvedilol  (COREG ) 25 MG tablet [id number]  Take 1 tablet (25 mg total) by mouth 2 (two) times daily with a meal. Jolaine Pac, DO  Active   chlorthalidone  (HYGROTON ) 25 MG tablet [id number]  Take 1 tablet (25 mg total) by mouth daily.  Patient not taking: Reported on 05/28/2024   Paseda, Folashade R, FNP  Active   fluconazole (DIFLUCAN) 50 MG tablet [id number]  Take 50 mg by mouth daily. [provider]  Active   hydrALAZINE  (APRESOLINE ) 100 MG tablet [id number]  Take 1 tablet (100 mg total) by mouth 3 (three) times daily.  Patient not taking: Reported on 06/11/2024   Gabino Boga, MD  Active   levETIRAcetam  (KEPPRA ) 250 MG tablet [id number]  Take 1 tablet (250 mg total) by  mouth 2 (two) times daily. Gregg Lek, MD  Active Self  omeprazole  (PRILOSEC) 40 MG capsule [id number]  Take 40 mg by mouth 2 (two) times daily. [provider]  Active   oxycodone  (OXY-IR) 5 MG capsule [id number]  Take 5 mg by mouth every 4 (four) hours as needed. [provider]  Active   polyethylene glycol (MIRALAX  / GLYCOLAX ) 17  g packet [id number]  Take 17 g by mouth daily. [provider]  Active   senna-docusate (SENOKOT-S) 8.6-50 MG tablet [id number]  Take 2 tablets by mouth at bedtime as needed for mild constipation. [provider]  Active   sodium zirconium cyclosilicate  (LOKELMA ) 10 g PACK packet [id number] Yes Take 10 g by mouth 2 (two) times daily. [provider]  Active   TARPEYO  4 MG CPDR [id number]  Take 16 mg by mouth daily with breakfast.  Patient not taking: Reported on 05/28/2024   [provider]  Active Self  valGANciclovir (VALCYTE) 450 MG tablet [id number]  Take 450 mg by mouth daily. [provider]  Active   verapamil  (CALAN -SR) 120 MG CR tablet [id number]  Take 1 tablet (120 mg total) by mouth daily.  Patient not taking: Reported on 05/28/2024   Maree Bracken D, DO  Expired 07/18/23 2359 Self            Goals Addressed             This Visit's Progress    VBCI Transitions of Care (TOC) Care Plan       Problems:  Recent Hospitalization for treatment of kidney transplant Equipment/DME barrier needs shower chair and Knowledge Deficit Related to kidney transplant and how to access transplant coordinator for concerns/ needs.  ED visit on 8/7 due to back pain   Goal:  Over the next 30 days, the patient will not experience hospital readmission  Interventions:  Transitions of Care: Doctor Visits  - discussed the importance of doctor visits Post discharge activity limitations prescribed by provider reviewed Post-op wound/incision care reviewed with patient/caregiver Reviewed Signs and symptoms of infection Confirmed patient has contact phone number for the transplant center and PAL ( Physician access line).    Assessed for new / ongoing symptoms Medications reviewed and compliance discussed Advised to call primary care provider office to schedule hospital follow up- primary care hospital follow up visit not scheduled. Re-dicussed importance of  hospital follow up- patient reports having follow up visit at transplant clinic on 06/04/24 and 06/07/24 Advised to  continue to measure vital signs, intake and output, weight.  Discussed wound care and reviewed signs/ symptoms of infection. Advised to call transplant team for fever >101.5 Advised to Drink around 2-3 liters of water a day and use the bathroom frequently, every 1.5-2 hours with frequent stops with car rides per your discharge instructions.- patient reports wound is healing well. He reports staples were removed and steri strips applied. Advised to alternate ice and heat to help with back pain  Referred to community resource guide regarding transportation assistance.- patient states he not longer needs to speak with the community resource guide because he has been cleared to drive and go back work light duty   Patient Self Care Activities:  Attend all scheduled provider appointments Call pharmacy for medication refills 3-7 days in advance of running out of medications Call provider office for new concerns or questions  Take medications as prescribed   Notify the transplant team for signs of wound infection: fever>101.5, any purulent or increased  drainage from the incision, redness extending from the incision, uncontrolled pain or any other questions or concerns.  Monitor your vital signs, your intake and output, and weight Contact Coordinator Powell Agent, RN, phone number [phone]. If unable to contact call front desk number [phone] and ask for coordinator in her office. Office is open from 8 am to 5pm.  Call PAL line [phone] on weekends, holidays, and after office hours for concerns or questions.  Drink around 2-3 liters of water a day and use the bathroom frequently, every 1.5-2 hours with frequent stops with car rides  Alternate ice/ heat application to back for pain relief  Plan:  Telephone follow up appointment with care management team member scheduled for:   06/18/24 1pm         Recommendation:   PCP Follow-up Continue Current Plan of Care  Follow Up Plan:   Telephone follow-up in 1 week 06/18/24 at 1 pm  Arvin Seip RN, BSN, CCM [place]  [facility], Population Health Case Manager Phone: [phone]

## 2024-06-15 NOTE — Patient Instructions (Signed)
 Visit Information  Thank you for taking time to visit with me today. Please don't hesitate to contact me if I can be of assistance to you before our next scheduled telephone appointment.  Our next appointment is by telephone on 06/18/24 at 1 pm  Following is a copy of your care plan:   Goals Addressed             This Visit's Progress    VBCI Transitions of Care (TOC) Care Plan       Problems:  Recent Hospitalization for treatment of kidney transplant Equipment/DME barrier needs shower chair and Knowledge Deficit Related to kidney transplant and how to access transplant coordinator for concerns/ needs.  ED visit on 8/7 due to back pain   Goal:  Over the next 30 days, the patient will not experience hospital readmission  Interventions:  Transitions of Care: Doctor Visits  - discussed the importance of doctor visits Post discharge activity limitations prescribed by provider reviewed Post-op wound/incision care reviewed with patient/caregiver Reviewed Signs and symptoms of infection Confirmed patient has contact phone number for the transplant center and PAL ( Physician access line).    Assessed for new / ongoing symptoms Medications reviewed and compliance discussed Advised to call primary care provider office to schedule hospital follow up- primary care hospital follow up visit not scheduled. Re-dicussed importance of hospital follow up- patient reports having follow up visit at transplant clinic on 06/04/24 and 06/07/24 Advised to  continue to measure vital signs, intake and output, weight.  Discussed wound care and reviewed signs/ symptoms of infection. Advised to call transplant team for fever >101.5 Advised to Drink around 2-3 liters of water a day and use the bathroom frequently, every 1.5-2 hours with frequent stops with car rides per your discharge instructions.- patient reports wound is healing well. He reports staples were removed and steri strips applied. Advised to alternate  ice and heat to help with back pain  Referred to community resource guide regarding transportation assistance.- patient states he not longer needs to speak with the community resource guide because he has been cleared to drive and go back work light duty   Patient Self Care Activities:  Attend all scheduled provider appointments Call pharmacy for medication refills 3-7 days in advance of running out of medications Call provider office for new concerns or questions  Take medications as prescribed   Notify the transplant team for signs of wound infection: fever>101.5, any purulent or increased drainage from the incision, redness extending from the incision, uncontrolled pain or any other questions or concerns.  Monitor your vital signs, your intake and output, and weight Contact Coordinator Powell Agent, RN, phone number [phone]. If unable to contact call front desk number [phone] and ask for coordinator in her office. Office is open from 8 am to 5pm.  Call PAL line [phone] on weekends, holidays, and after office hours for concerns or questions.  Drink around 2-3 liters of water a day and use the bathroom frequently, every 1.5-2 hours with frequent stops with car rides  Alternate ice/ heat application to back for pain relief  Plan:  Telephone follow up appointment with care management team member scheduled for:  06/18/24 1pm        Patient verbalizes understanding of instructions and care plan provided today and agrees to view in MyChart. Active MyChart status and patient understanding of how to access instructions and care plan via MyChart confirmed with patient.     The patient has been  provided with contact information for the care management team and has been advised to call with any health related questions or concerns.   Please call the care guide team at [phone] if you need to cancel or reschedule your appointment.   Please call the Suicide and Crisis Lifeline:  988 call 1-800-273-TALK (toll free, 24 hour hotline) if you are experiencing a Mental Health or Behavioral Health Crisis or need someone to talk to.  Arvin Seip RN, BSN, CCM [employer], Population Health Case Manager Phone: [phone]

## 2024-06-18 ENCOUNTER — Telehealth: Payer: Self-pay

## 2024-06-21 ENCOUNTER — Telehealth: Payer: Self-pay

## 2024-06-21 NOTE — Transitions of Care (Post Inpatient/ED Visit) (Signed)
 Transition of Care week 3  Visit Note  06/21/2024  Name: Gary Cox MRN: [id number]          DOB: 1986/03/15  Situation: Patient enrolled in [facility] 30-day program. Visit completed with patient by telephone. Patient states his primary care provider is with [facility].       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  Patient has non [facility] provider. Problems:  Recent Hospitalization for treatment of kidney transplant Equipment/DME barrier needs shower chair and Knowledge Deficit Related to kidney transplant and how to access transplant coordinator for concerns/ needs.    Goal:  Over the next 30 days, the patient will not experience hospital readmission  Interventions:  Transitions of Care: Doctor Visits  - discussed the importance of doctor visits Reviewed Signs and symptoms of infection Confirmed patient has contact phone number for the transplant center and PAL ( Physician access line).    Assessed for new / ongoing symptoms Attempted to review medications with patient. Patient states he is doing to the doctor on tomorrow 06/22/24 and wants to get a new after visit summary paper so that he can make sure what medications he is taking.  Patient request return call on tomorrow to review medications.  Advised to call primary care provider office to schedule hospital follow up- primary care hospital follow up visit not scheduled. Re-dicussed importance of hospital follow up- patient reports having follow up visit at transplant clinic on 06/04/24 and 06/07/24 Advised to  continue to measure vital signs, intake and output, weight.  Discussed wound care and reviewed signs/ symptoms of infection. Advised to call transplant team for fever >101.5 Advised to Drink  around 2-3 liters of water a day and use the bathroom frequently, every 1.5-2 hours with frequent stops with car rides per your discharge instructions.- patient reports wound is healing well. He reports staples were removed and steri strips applied. Advised to alternate ice and heat to help with back pain  Referred to community resource guide regarding transportation assistance.- patient states he not longer needs to speak with the community resource guide because he has been cleared to drive and go back work light duty   Patient Self Care Activities:  Attend all scheduled provider appointments Call pharmacy for medication refills 3-7 days in advance of running out of medications Call provider office for new concerns or questions  Take medications as prescribed   Notify the transplant team for signs of wound infection: fever>101.5, any purulent or increased drainage from the incision, redness extending from the incision, uncontrolled pain or any other questions or concerns.  Monitor your vital signs, your intake and output, and weight Contact Coordinator Powell Agent, RN, phone number [phone]. If unable to contact call front desk number [phone] and ask for coordinator in her office. Office is open from 8 am to 5pm.  Call PAL line [phone] on weekends, holidays, and after office hours for concerns or questions.  Drink around 2-3 liters of water a day and use the bathroom frequently, every 1.5-2 hours with frequent stops with car rides  Alternate ice/ heat application to back for pain relief  Plan:  Telephone follow up  appointment with care management team member scheduled for:  06/18/24 1pm        Recommendation:   Ongoing follow up with providers  Follow Up Plan:   Closing From:  Transitions of Care Program due to patient having non [facility] provider.   Arvin Seip RN, BSN, CCM [employer], Population Health Case Manager Phone:  [phone]

## 2024-06-22 ENCOUNTER — Telehealth

## 2024-06-22 ENCOUNTER — Telehealth: Payer: Self-pay

## 2024-06-22 NOTE — Transitions of Care (Post Inpatient/ED Visit) (Signed)
   06/22/2024  Name: Gary Cox MRN: [id number] DOB: 03-29-1986  Today's TOC FU Call Status:   Patient's Name and Date of Birth confirmed.  Transition Care Management Follow-up Telephone Call Date of Discharge: 06/10/24 Discharge Facility: Other (Non-[facility] Facility) Type of Discharge: Emergency Department How have you been since you were released from the hospital?: Better  Items Reviewed:    Medications Reviewed Today: Medications Reviewed Today   Medications were not reviewed in this encounter     Home Care and Equipment/Supplies:    Functional Questionnaire:    Follow up appointments reviewed:      [facility], RMA

## 2024-07-19 NOTE — Progress Notes (Deleted)
 [facility]  PATIENT: Gary Cox DOB: 11/25/1985  REQUESTING CLINICIAN: Tilford Bertram HERO, FNP HISTORY FROM: Patient and chart review  REASON FOR VISIT: Seizure    HISTORICAL  CHIEF COMPLAINT:  No chief complaint on file.  HPI:  Update 07/20/2024 JM: Patient returns for yearly follow-up visit.  He has been stable from neurological standpoint without any recurrent seizure activity.  Remains on Keppra  250 mg twice daily without side effects.  He did undergo kidney transplant on 05/21/2024 and continues to be followed by [facility] transplant team.     Consult visit 07/15/2023 Dr. 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  Patient tells me that he was on the phone with his wife discussing purchasing a house.  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  He reports waking up to EMS and going to the hospital.  Per chart review it seems that patient was on the phone talking to family then complaint of headaches then dropped his phone and had shaking movement.  In the hospital he was also noted to be in hypertensive urgency.  His blood pressure was controlled, he did have MRI brain showing the bilateral bifrontal encephalomalacia and his EEG showed left frontal temporal frontocentral spike. Also on chart review it seems that patient did have an event in May 2022 that was concerning for seizures.  Due to these episodes, abnormal MRI and EEG he was started on Keppra  250 mg twice daily.  He reports since being on the Keppra  no events, and denies any side effect from the medication.  Overall he is doing well.  He does report his blood pressure is better controlled now and his headaches are also better controlled.    Handedness: Right handed   Onset: June 16  Seizure Type: Convulsion  Current frequency: 2, May 2022 and June 2024  Any injuries from seizures: Denies   Seizure risk factors: History of TBI with bifrontal encephalomalacia   Previous ASMs: None   Currenty ASMs: Keppra  250 mg BID   ASMs side effects: Denies   Brain Images: Bifrontal encephalomalacia   Previous EEGs: Left frontocentral/temporal spikes    OTHER MEDICAL CONDITIONS: IgA Nephropathy, Hypertension, Previous TBI  REVIEW OF SYSTEMS: Full 14 system review of systems performed and negative with exception of: As noted in the HPI   ALLERGIES: Allergies  Allergen Reactions   Pork-Derived Products     HOME MEDICATIONS: Outpatient Medications Prior to Visit  Medication Sig Dispense Refill   acetaminophen  (TYLENOL ) 500 MG tablet Take 1,000 mg by mouth every 6 (six) hours as needed for mild pain (pain score 1-3).     aspirin EC 81 MG tablet Take 81 mg by mouth daily. Swallow whole.     carvedilol  (COREG ) 25 MG tablet Take 1 tablet (25 mg total) by mouth 2 (two) times daily with a meal. 60 tablet 0   chlorthalidone  (HYGROTON ) 25 MG tablet Take 1 tablet (25 mg total) by mouth daily. (Patient not taking: Reported on 05/28/2024) 30 tablet 0   fluconazole (DIFLUCAN) 50 MG tablet Take 50 mg by mouth daily.     hydrALAZINE  (APRESOLINE ) 100 MG tablet Take 1 tablet (100 mg total) by mouth 3 (three) times daily. (Patient not taking: Reported on 06/11/2024)  90 tablet 0   levETIRAcetam  (KEPPRA ) 250 MG tablet Take 1 tablet (250 mg total) by mouth 2 (two) times daily. 180 tablet 3   omeprazole  (PRILOSEC) 40 MG capsule Take 40 mg by mouth 2 (two) times daily.     oxycodone  (OXY-IR) 5 MG capsule Take 5 mg by mouth every 4 (four) hours as needed.     polyethylene glycol (MIRALAX  / GLYCOLAX ) 17 g packet Take 17 g by mouth daily.     senna-docusate (SENOKOT-S) 8.6-50 MG tablet Take 2 tablets by mouth at bedtime as needed for mild  constipation.     sodium zirconium cyclosilicate  (LOKELMA ) 10 g PACK packet Take 10 g by mouth 2 (two) times daily.     TARPEYO  4 MG CPDR Take 16 mg by mouth daily with breakfast. (Patient not taking: Reported on 05/28/2024)     valGANciclovir (VALCYTE) 450 MG tablet Take 450 mg by mouth daily.     verapamil  (CALAN -SR) 120 MG CR tablet Take 1 tablet (120 mg total) by mouth daily. (Patient not taking: Reported on 05/28/2024) 30 tablet 1   No facility-administered medications prior to visit.    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   Renal disorder    UTI (urinary tract infection)     PAST SURGICAL HISTORY: Past Surgical History:  Procedure Laterality Date   CYST REMOVAL NECK      FAMILY HISTORY: Family History  Problem Relation Age of Onset   Hypertension Mother    Hypertension Father     SOCIAL HISTORY: Social History   Socioeconomic History   Marital status: Married    Spouse name: Not on file   Number of children: Not on file   Years of education: Not on file   Highest education level: Not on file  Occupational History   Not on file  Tobacco Use   Smoking status: Former    Current packs/day: 0.00    Average packs/day: 1 pack/day for 3.0 years (3.0 ttl pk-yrs)    Types: Cigarettes    Start date: 08/26/2012    Quit date: 08/27/2015    Years since quitting: 8.9   Smokeless tobacco: Never  Vaping Use   Vaping status: Never Used  Substance and Sexual Activity   Alcohol use: No   Drug use: No   Sexual activity: Yes  Other Topics Concern   Not on file  Social History Narrative   ** Merged History Encounter **    Right handed   Caffeine -none   Social Drivers of Health   Financial Resource Strain: Low Risk  (03/11/2024)   Received from [facility]   Overall Financial Resource Strain (CARDIA)    Difficulty of Paying Living Expenses: Not hard at all  Food Insecurity: No Food Insecurity (06/03/2024)   Hunger  Vital Sign    Worried About Running Out of Food in the Last Year: Never true    Ran Out of Food in the Last Year: Never true  Transportation Needs: No Transportation Needs (05/31/2024)   Received from [facility]    In the past 12 months, has lack of reliable transportation kept you from medical appointments, meetings, work or from getting things needed for daily living? : No  Physical Activity: Unknown (03/11/2024)   Received from [facility]   Exercise Vital Sign    On average, how many days per week do you engage in moderate to strenuous exercise (like a brisk  walk)?: 0 days    Minutes of Exercise per Session: Not on file  Stress: No Stress Concern Present (03/19/2024)   Received from [facility] - Occupational Stress Questionnaire    Feeling of Stress : Not at all  Social Connections: Socially Integrated (03/11/2024)   Received from [facility]   Social Network    How would you rate your social network (family, work, friends)?: Good participation with social networks  Intimate Partner Violence: Not At Risk (06/03/2024)   Humiliation, Afraid, Rape, and Kick questionnaire    Fear of Current or Ex-Partner: No    Emotionally Abused: No    Physically Abused: No    Sexually Abused: No    PHYSICAL EXAM  GENERAL EXAM/CONSTITUTIONAL: Vitals:  There were no vitals filed for this visit.  There is no height or weight on file to calculate BMI. Wt Readings from Last 3 Encounters:  07/30/23 172 lb (78 kg)  07/18/23 172 lb 11.2 oz (78.3 kg)  07/15/23 178 lb (80.7 kg)   Patient is in no distress; well developed, nourished and groomed; neck is supple  MUSCULOSKELETAL: Gait, strength, tone, movements noted in Neurologic exam below  NEUROLOGIC: MENTAL STATUS:      No data to display         awake, alert, oriented to person, place and time recent and remote memory intact normal attention and concentration language  fluent, comprehension intact, naming intact fund of knowledge appropriate  CRANIAL NERVE:  2nd, 3rd, 4th, 6th - Visual fields full to confrontation, extraocular muscles intact, no nystagmus 5th - facial sensation symmetric 7th - facial strength symmetric 8th - hearing intact 9th - palate elevates symmetrically, uvula midline 11th - shoulder shrug symmetric 12th - tongue protrusion midline  MOTOR:  normal bulk and tone, full strength in the BUE, BLE  SENSORY:  normal and symmetric to light touch  COORDINATION:  finger-nose-finger, fine finger movements normal  GAIT/STATION:  normal   DIAGNOSTIC DATA (LABS, IMAGING, TESTING) - I reviewed patient records, labs, notes, testing and imaging myself where available.  Lab Results  Component Value Date   WBC 8.4 03/20/2024   HGB 8.5 (L) 03/20/2024   HCT 25.0 (L) 03/20/2024   MCV 84.3 03/20/2024   PLT 243 03/20/2024      Component Value Date/Time   NA 140 03/20/2024 2347   K 4.0 03/20/2024 2347   CL 104 03/20/2024 2347   CO2 21 (L) 03/20/2024 2237   GLUCOSE 121 (H) 03/20/2024 2347   BUN 68 (H) 03/20/2024 2347   CREATININE 8.70 (H) 03/20/2024 2347   CREATININE 0.90 11/27/2016 0922   CALCIUM  8.2 (L) 03/20/2024 2237   PROT 6.3 (L) 03/20/2024 2237   ALBUMIN 3.0 (L) 03/20/2024 2237   AST 19 03/20/2024 2237   ALT 8 03/20/2024 2237   ALKPHOS 69 03/20/2024 2237   BILITOT 0.9 03/20/2024 2237   GFRNONAA 8 (L) 03/20/2024 2237   GFRNONAA >89 11/27/2016 0922   GFRAA >60 08/26/2017 0715   GFRAA >89 11/27/2016 0922   Lab Results  Component Value Date   CHOL 258 (H) 11/27/2016   HDL 64 11/27/2016   LDLCALC 167 (H) 11/27/2016   TRIG 133 11/27/2016   Lab Results  Component Value Date   HGBA1C 5.6 05/05/2023   No results found for: [id number] Lab Results  Component Value Date   TSH 2.177 04/21/2023   MRI Brain 04/22/2023 1. No acute intracranial abnormality. 2. Bifrontal encephalomalacia and gliosis  is likely  posttraumatic.   EEG 04/23/2023 -Spike, left fronto-centro-temporal region   I personally reviewed brain Images and previous EEG reports.     ASSESSMENT AND PLAN  38 y.o. year old male  with history of IgA nephropathy, refractive hypertension, TBI with encephalomalacia returns for seizure follow-up.  Initial seizure 03/2021 and recurrent episode 04/2023.   Seizure disorder No recurrent seizure activity Continue Keppra  250 mg twice daily Advised to call with any recurrent seizure activity Discussed importance of avoiding seizure provoking triggers and medication compliance MRI 04/2023 bilateral bifrontal encephalomalacia EEG 04/2023 left frontal temporal frontal central spike         Per [place]  DMV statutes, patients with seizures are not allowed to drive until they have been seizure-free for six months.  Other recommendations include using caution when using heavy equipment or power tools. Avoid working on ladders or at heights. Take showers instead of baths.  Do not swim alone.  Ensure the water temperature is not too high on the home water heater. Do not go swimming alone. Do not lock yourself in a room alone (i.e. bathroom). When caring for infants or small children, sit down when holding, feeding, or changing them to minimize risk of injury to the child in the event you have a seizure. Maintain good sleep hygiene. Avoid alcohol.  Also recommend adequate sleep, hydration, good diet and minimize stress.   During the Seizure  - First, ensure adequate ventilation and place patients on the floor on their left side  Loosen clothing around the neck and ensure the airway is patent. If the patient is clenching the teeth, do not force the mouth open with any object as this can cause severe damage - Remove all items from the surrounding that can be hazardous. The patient may be oblivious to what's happening and may not even know what he or she is doing. If the patient is confused  and wandering, either gently guide him/her away and block access to outside areas - Reassure the individual and be comforting - Call 911. In most cases, the seizure ends before EMS arrives. However, there are cases when seizures may last over 3 to 5 minutes. Or the individual may have developed breathing difficulties or severe injuries. If a pregnant patient or a person with diabetes develops a seizure, it is prudent to call an ambulance. - Finally, if the patient does not regain full consciousness, then call EMS. Most patients will remain confused for about 45 to 90 minutes after a seizure, so you must use judgment in calling for help. - Avoid restraints but make sure the patient is in a bed with padded side rails - Place the individual in a lateral position with the neck slightly flexed; this will help the saliva drain from the mouth and prevent the tongue from falling backward - Remove all nearby furniture and other hazards from the area - Provide verbal assurance as the individual is regaining consciousness - Provide the patient with privacy if possible - Call for help and start treatment as ordered by the caregiver   After the Seizure (Postictal Stage)  After a seizure, most patients experience confusion, fatigue, muscle pain and/or a headache. Thus, one should permit the individual to sleep. For the next few days, reassurance is essential. Being calm and helping reorient the person is also of importance.  Most seizures are painless and end spontaneously. Seizures are not harmful to others but can lead to complications such as stress on the lungs,  brain and the heart. Individuals with prior lung problems may develop labored breathing and respiratory distress.     No orders of the defined types were placed in this encounter.   No orders of the defined types were placed in this encounter.   No follow-ups on file.    I personally spent a total of *** minutes in the care of the patient  today including {Time Based Coding:[id number]}.  Harlene Bogaert, AGNP-BC  [facility] [address] [place], [place] [address]  Phone [phone] Fax [phone] Note: This document was prepared with digital dictation and possible smart phrase technology. Any transcriptional errors that result from this process are unintentional.

## 2024-07-20 ENCOUNTER — Encounter: Payer: Self-pay | Admitting: Adult Health

## 2024-07-20 ENCOUNTER — Ambulatory Visit: Payer: Medicaid Other | Admitting: Adult Health
